# Patient Record
Sex: Female | Born: 1955
Health system: Southern US, Community
[De-identification: ages and names within clinical notes are randomized; demographics above are authoritative.]

## PROBLEM LIST (undated history)

## (undated) DIAGNOSIS — F411 Generalized anxiety disorder: Secondary | ICD-10-CM

## (undated) DIAGNOSIS — K219 Gastro-esophageal reflux disease without esophagitis: Secondary | ICD-10-CM

## (undated) DIAGNOSIS — I4949 Other premature depolarization: Secondary | ICD-10-CM

## (undated) DIAGNOSIS — R9439 Abnormal result of other cardiovascular function study: Secondary | ICD-10-CM

## (undated) DIAGNOSIS — Z86010 Personal history of colonic polyps: Secondary | ICD-10-CM

## (undated) DIAGNOSIS — E785 Hyperlipidemia, unspecified: Secondary | ICD-10-CM

## (undated) DIAGNOSIS — I10 Essential (primary) hypertension: Secondary | ICD-10-CM

## (undated) DIAGNOSIS — R7302 Impaired glucose tolerance (oral): Secondary | ICD-10-CM

## (undated) HISTORY — DX: Gastro-esophageal reflux disease without esophagitis: K21.9

## (undated) HISTORY — DX: Abnormal result of other cardiovascular function study: R94.39

## (undated) HISTORY — DX: Impaired glucose tolerance (oral): R73.02

## (undated) HISTORY — DX: Hyperlipidemia, unspecified: E78.5

## (undated) HISTORY — DX: Generalized anxiety disorder: F41.1

## (undated) HISTORY — DX: Personal history of colonic polyps: Z86.010

## (undated) HISTORY — DX: Other premature depolarization: I49.49

## (undated) HISTORY — DX: Essential (primary) hypertension: I10

---

## 1997-03-24 HISTORY — PX: ABDOMINAL HYSTERECTOMY: SHX81

## 2000-06-24 ENCOUNTER — Inpatient Hospital Stay (HOSPITAL_COMMUNITY): Admission: RE | Admit: 2000-06-24 | Discharge: 2000-06-26 | Payer: Self-pay | Admitting: Obstetrics and Gynecology

## 2000-06-24 ENCOUNTER — Encounter (INDEPENDENT_AMBULATORY_CARE_PROVIDER_SITE_OTHER): Payer: Self-pay | Admitting: Specialist

## 2003-01-24 ENCOUNTER — Encounter: Payer: Self-pay | Admitting: Internal Medicine

## 2004-04-24 ENCOUNTER — Ambulatory Visit: Payer: Self-pay | Admitting: Internal Medicine

## 2004-08-22 ENCOUNTER — Ambulatory Visit: Payer: Self-pay | Admitting: Internal Medicine

## 2005-01-14 ENCOUNTER — Ambulatory Visit: Payer: Self-pay | Admitting: Internal Medicine

## 2005-02-22 ENCOUNTER — Ambulatory Visit: Payer: Self-pay | Admitting: Internal Medicine

## 2005-02-28 ENCOUNTER — Ambulatory Visit: Payer: Self-pay | Admitting: Internal Medicine

## 2005-09-25 ENCOUNTER — Ambulatory Visit: Payer: Self-pay | Admitting: Internal Medicine

## 2006-03-03 ENCOUNTER — Ambulatory Visit: Payer: Self-pay | Admitting: Internal Medicine

## 2006-03-03 LAB — CONVERTED CEMR LAB
ALT: 21 units/L (ref 0–40)
AST: 19 units/L (ref 0–37)
Albumin: 4.2 g/dL (ref 3.5–5.2)
Alkaline Phosphatase: 93 units/L (ref 39–117)
BUN: 8 mg/dL (ref 6–23)
CO2: 27 meq/L (ref 19–32)
Calcium: 9.5 mg/dL (ref 8.4–10.5)
Chloride: 108 meq/L (ref 96–112)
Chol/HDL Ratio, serum: 4.4
Cholesterol: 181 mg/dL (ref 0–200)
Creatinine, Ser: 0.6 mg/dL (ref 0.4–1.2)
GFR calc non Af Amer: 112 mL/min
Glomerular Filtration Rate, Af Am: 135 mL/min/{1.73_m2}
Glucose, Bld: 108 mg/dL — ABNORMAL HIGH (ref 70–99)
HCT: 41.1 % (ref 36.0–46.0)
HDL: 40.7 mg/dL (ref >39.0)
Hemoglobin: 13.9 g/dL (ref 12.0–15.0)
LDL Cholesterol: 112 mg/dL — ABNORMAL HIGH (ref 0–99)
MCHC: 33.7 g/dL (ref 30.0–36.0)
MCV: 85.6 fL (ref 78.0–100.0)
Platelets: 254 10*3/uL (ref 150–400)
Potassium: 3.7 meq/L (ref 3.5–5.1)
RBC: 4.8 M/uL (ref 3.87–5.11)
RDW: 13.7 % (ref 11.5–14.6)
Sodium: 142 meq/L (ref 135–145)
TSH: 1.75 microintl units/mL (ref 0.35–5.50)
Total Bilirubin: 0.6 mg/dL (ref 0.3–1.2)
Total Protein: 7.4 g/dL (ref 6.0–8.3)
Triglyceride fasting, serum: 142 mg/dL (ref 0–149)
VLDL: 28 mg/dL (ref 0–40)
WBC: 5 10*3/uL (ref 4.5–10.5)

## 2006-04-21 ENCOUNTER — Ambulatory Visit: Payer: Self-pay | Admitting: Internal Medicine

## 2006-09-14 ENCOUNTER — Encounter: Payer: Self-pay | Admitting: Internal Medicine

## 2006-09-14 ENCOUNTER — Ambulatory Visit: Payer: Self-pay | Admitting: Internal Medicine

## 2006-09-14 DIAGNOSIS — E785 Hyperlipidemia, unspecified: Secondary | ICD-10-CM

## 2006-09-14 DIAGNOSIS — I10 Essential (primary) hypertension: Secondary | ICD-10-CM | POA: Insufficient documentation

## 2006-09-14 HISTORY — DX: Essential (primary) hypertension: I10

## 2006-09-14 HISTORY — DX: Hyperlipidemia, unspecified: E78.5

## 2006-10-13 ENCOUNTER — Telehealth: Payer: Self-pay | Admitting: Internal Medicine

## 2006-10-27 ENCOUNTER — Telehealth: Payer: Self-pay | Admitting: Internal Medicine

## 2007-11-18 ENCOUNTER — Telehealth: Payer: Self-pay | Admitting: Internal Medicine

## 2008-01-04 ENCOUNTER — Ambulatory Visit: Payer: Self-pay | Admitting: Internal Medicine

## 2008-01-04 DIAGNOSIS — E049 Nontoxic goiter, unspecified: Secondary | ICD-10-CM | POA: Insufficient documentation

## 2008-01-10 ENCOUNTER — Telehealth: Payer: Self-pay | Admitting: Internal Medicine

## 2008-02-02 ENCOUNTER — Telehealth: Payer: Self-pay | Admitting: Internal Medicine

## 2008-02-08 ENCOUNTER — Telehealth: Payer: Self-pay | Admitting: Internal Medicine

## 2008-02-09 ENCOUNTER — Ambulatory Visit: Payer: Self-pay | Admitting: Internal Medicine

## 2008-02-23 ENCOUNTER — Ambulatory Visit: Payer: Self-pay | Admitting: Internal Medicine

## 2008-02-23 ENCOUNTER — Encounter: Payer: Self-pay | Admitting: Internal Medicine

## 2008-02-23 LAB — HM COLONOSCOPY: HM Colonoscopy: 2019

## 2008-03-02 ENCOUNTER — Encounter: Payer: Self-pay | Admitting: Internal Medicine

## 2008-07-31 ENCOUNTER — Encounter: Payer: Self-pay | Admitting: Internal Medicine

## 2008-08-14 ENCOUNTER — Ambulatory Visit: Payer: Self-pay | Admitting: Internal Medicine

## 2008-08-14 DIAGNOSIS — K219 Gastro-esophageal reflux disease without esophagitis: Secondary | ICD-10-CM

## 2008-08-14 DIAGNOSIS — I4949 Other premature depolarization: Secondary | ICD-10-CM | POA: Insufficient documentation

## 2008-08-14 HISTORY — DX: Other premature depolarization: I49.49

## 2008-08-14 HISTORY — DX: Gastro-esophageal reflux disease without esophagitis: K21.9

## 2008-08-24 ENCOUNTER — Telehealth (INDEPENDENT_AMBULATORY_CARE_PROVIDER_SITE_OTHER): Payer: Self-pay | Admitting: *Deleted

## 2008-08-29 ENCOUNTER — Encounter: Payer: Self-pay | Admitting: Internal Medicine

## 2008-08-29 ENCOUNTER — Ambulatory Visit: Payer: Self-pay

## 2008-08-31 ENCOUNTER — Telehealth: Payer: Self-pay | Admitting: Internal Medicine

## 2008-10-19 DIAGNOSIS — R9439 Abnormal result of other cardiovascular function study: Secondary | ICD-10-CM | POA: Insufficient documentation

## 2008-10-19 HISTORY — DX: Abnormal result of other cardiovascular function study: R94.39

## 2008-11-20 ENCOUNTER — Ambulatory Visit: Payer: Self-pay | Admitting: Cardiovascular Disease

## 2009-01-11 ENCOUNTER — Encounter (INDEPENDENT_AMBULATORY_CARE_PROVIDER_SITE_OTHER): Payer: Self-pay | Admitting: *Deleted

## 2009-03-05 ENCOUNTER — Encounter (INDEPENDENT_AMBULATORY_CARE_PROVIDER_SITE_OTHER): Payer: Self-pay | Admitting: *Deleted

## 2009-03-29 ENCOUNTER — Encounter: Payer: Self-pay | Admitting: Internal Medicine

## 2009-05-28 ENCOUNTER — Ambulatory Visit: Payer: Self-pay | Admitting: Internal Medicine

## 2009-05-28 LAB — CONVERTED CEMR LAB
ALT: 20 units/L (ref 0–35)
AST: 21 units/L (ref 0–37)
Albumin: 4.1 g/dL (ref 3.5–5.2)
Alkaline Phosphatase: 71 units/L (ref 39–117)
BUN: 10 mg/dL (ref 6–23)
Basophils Absolute: 0 10*3/uL (ref 0.0–0.1)
Basophils Relative: 1 % (ref 0.0–3.0)
Bilirubin Urine: NEGATIVE
Bilirubin, Direct: 0.1 mg/dL (ref 0.0–0.3)
CO2: 30 meq/L (ref 19–32)
Calcium: 9.2 mg/dL (ref 8.4–10.5)
Chloride: 109 meq/L (ref 96–112)
Cholesterol: 197 mg/dL (ref 0–200)
Creatinine, Ser: 0.7 mg/dL (ref 0.4–1.2)
Eosinophils Absolute: 0.1 10*3/uL (ref 0.0–0.7)
Eosinophils Relative: 1.8 % (ref 0.0–5.0)
GFR calc non Af Amer: 111.49 mL/min (ref >60)
Glucose, Bld: 95 mg/dL (ref 70–99)
Glucose, Urine, Semiquant: NEGATIVE
HCT: 37.5 % (ref 36.0–46.0)
HDL: 46.7 mg/dL (ref >39.00)
Hemoglobin: 12.3 g/dL (ref 12.0–15.0)
Ketones, urine, test strip: NEGATIVE
LDL Cholesterol: 111 mg/dL — ABNORMAL HIGH (ref 0–99)
Lymphocytes Relative: 42.7 % (ref 12.0–46.0)
Lymphs Abs: 1.7 10*3/uL (ref 0.7–4.0)
MCHC: 32.8 g/dL (ref 30.0–36.0)
MCV: 87.4 fL (ref 78.0–100.0)
Monocytes Absolute: 0.2 10*3/uL (ref 0.1–1.0)
Monocytes Relative: 4.9 % (ref 3.0–12.0)
Neutro Abs: 2 10*3/uL (ref 1.4–7.7)
Neutrophils Relative %: 49.6 % (ref 43.0–77.0)
Nitrite: NEGATIVE
Platelets: 180 10*3/uL (ref 150.0–400.0)
Potassium: 3.7 meq/L (ref 3.5–5.1)
RBC: 4.29 M/uL (ref 3.87–5.11)
RDW: 13.5 % (ref 11.5–14.6)
Sodium: 144 meq/L (ref 135–145)
Specific Gravity, Urine: 1.02
TSH: 1.2 microintl units/mL (ref 0.35–5.50)
Total Bilirubin: 0.3 mg/dL (ref 0.3–1.2)
Total CHOL/HDL Ratio: 4
Total Protein: 7 g/dL (ref 6.0–8.3)
Triglycerides: 195 mg/dL — ABNORMAL HIGH (ref 0.0–149.0)
Urobilinogen, UA: 0.2
VLDL: 39 mg/dL (ref 0.0–40.0)
WBC: 4 10*3/uL — ABNORMAL LOW (ref 4.5–10.5)
pH: 6

## 2009-06-04 ENCOUNTER — Ambulatory Visit: Payer: Self-pay | Admitting: Internal Medicine

## 2009-06-04 DIAGNOSIS — F411 Generalized anxiety disorder: Secondary | ICD-10-CM | POA: Insufficient documentation

## 2009-06-04 HISTORY — DX: Generalized anxiety disorder: F41.1

## 2010-04-21 LAB — CONVERTED CEMR LAB
ALT: 23 units/L (ref 0–35)
AST: 22 units/L (ref 0–37)
Albumin: 4.4 g/dL (ref 3.5–5.2)
Alkaline Phosphatase: 75 units/L (ref 39–117)
BUN: 14 mg/dL (ref 6–23)
Basophils Absolute: 0.1 10*3/uL (ref 0.0–0.1)
Basophils Relative: 1.8 % (ref 0.0–3.0)
Bilirubin, Direct: 0.1 mg/dL (ref 0.0–0.3)
CO2: 28 meq/L (ref 19–32)
Calcium: 9.6 mg/dL (ref 8.4–10.5)
Chloride: 107 meq/L (ref 96–112)
Cholesterol: 214 mg/dL (ref 0–200)
Creatinine, Ser: 0.8 mg/dL (ref 0.4–1.2)
Direct LDL: 122 mg/dL
Eosinophils Absolute: 0.1 10*3/uL (ref 0.0–0.7)
Eosinophils Relative: 2.1 % (ref 0.0–5.0)
GFR calc Af Amer: 96 mL/min
GFR calc non Af Amer: 79 mL/min
Glucose, Bld: 79 mg/dL (ref 70–99)
HCT: 40.4 % (ref 36.0–46.0)
HDL: 38.1 mg/dL — ABNORMAL LOW (ref >39.0)
Hemoglobin: 13.2 g/dL (ref 12.0–15.0)
Lymphocytes Relative: 39.8 % (ref 12.0–46.0)
MCHC: 32.8 g/dL (ref 30.0–36.0)
MCV: 85.7 fL (ref 78.0–100.0)
Monocytes Absolute: 0.1 10*3/uL (ref 0.1–1.0)
Monocytes Relative: 2.7 % — ABNORMAL LOW (ref 3.0–12.0)
Neutro Abs: 2.9 10*3/uL (ref 1.4–7.7)
Neutrophils Relative %: 53.6 % (ref 43.0–77.0)
Platelets: 196 10*3/uL (ref 150–400)
Potassium: 3.7 meq/L (ref 3.5–5.1)
RBC: 4.71 M/uL (ref 3.87–5.11)
RDW: 14.1 % (ref 11.5–14.6)
Sodium: 143 meq/L (ref 135–145)
TSH: 1.27 microintl units/mL (ref 0.35–5.50)
Total Bilirubin: 0.7 mg/dL (ref 0.3–1.2)
Total CHOL/HDL Ratio: 5.6
Total Protein: 7.6 g/dL (ref 6.0–8.3)
Triglycerides: 260 mg/dL (ref 0–149)
VLDL: 52 mg/dL — ABNORMAL HIGH (ref 0–40)
WBC: 5.3 10*3/uL (ref 4.5–10.5)

## 2010-04-23 NOTE — Progress Notes (Signed)
Summary: 90 day Rx for [facility]  Phone Note Call from Patient Call back at Work Phone [phone]   Caller: Patient Summary of Call: has Rx request- needs to give details. Pls call.  Pt called back, requesting 90 day Rx for [facility] for Pravastatin Sodium 40mg , one daily.  Pt requested the Rx be mailed to her home. Sid Falcon LPN  February 02, 2008 12:55 PM  Initial call taken by: Raechel Ache, RN,  February 02, 2008 11:57 AM      Prescriptions: PRAVASTATIN SODIUM 40 MG TABS (PRAVASTATIN SODIUM) one tab daily  #90 x 4   Entered by:   Raechel Ache, RN   Authorized by:   Gordy Savers  MD   Signed by:   Raechel Ache, RN on 02/07/2008   Method used:   Print then Give to Patient   RxID:   [id number]

## 2010-04-23 NOTE — Progress Notes (Signed)
Summary: 90 day prescription  Phone Note Call from Patient   Caller: Patient Call For: Darel Hong Summary of Call: Pt is calling asking about her 90 day prescription and if it has been done. Initial call taken by: Lynann Beaver CMA,  February 08, 2008 10:31 AM  Follow-up for Phone Call        Phone Call Completed, Rx was mailed. Follow-up by: Raechel Ache, RN,  February 08, 2008 10:55 AM

## 2010-04-23 NOTE — Letter (Signed)
Summary: Appointment - Missed  [facility], Main Office  [address]   [place], [place] [address]   Phone: [phone]  Fax: [phone]     January 11, 2009 MRN: [id number]   Vanessa Price [address] [place], [place]  [address]   Dear Ms. Derek Jack,  Our records indicate you missed your appointment on 01/08/2009 with Dr. Eden Emms.                                    It is very important that we reach you to reschedule this appointment. We look forward to participating in your health care needs. Please contact us at the number listed above at your earliest convenience to reschedule this appointment.  Sincerely,    Migdalia Dk [facility] Scheduling Team

## 2010-04-23 NOTE — Progress Notes (Signed)
Summary: lab results  Phone Note Call from Patient   Caller: Patient Call For: Dr Kirtland Bouchard Summary of Call: Pt would like lab results. [phone] Please leave a detailed message on her phone. Initial call taken by: Lynann Beaver CMA,  January 10, 2008 1:30 PM    all OK;  cholesterol 214  Appended Document: lab results LM on pt's cell phone with results.

## 2010-04-23 NOTE — Progress Notes (Signed)
Summary: statin  Call back at [phone]   Caller: vm Call For: kwia Summary of Call: Requests cheapest statin for high cholesterol be called into [facility]. Initial call taken by: Rudy Jew, RN,  November 18, 2007 3:53 PM     pravastatin 40  #90 one daily  Appended Document: statin Medications Added PRAVASTATIN SODIUM 40 MG TABS (PRAVASTATIN SODIUM) one tab daily          Clinical Lists Changes  Medications: Added new medication of PRAVASTATIN SODIUM 40 MG TABS (PRAVASTATIN SODIUM) one tab daily - Signed Rx of PRAVASTATIN SODIUM 40 MG TABS (PRAVASTATIN SODIUM) one tab daily;  #90 x 1;  Signed;  Entered by: Sid Falcon LPN;  Authorized by: Gordy Savers  MD;  Method used: Electronically to [facility] [phone] [place], [place], [place]  [address], Ph: [phone], Fax: [phone]    Prescriptions: PRAVASTATIN SODIUM 40 MG TABS (PRAVASTATIN SODIUM) one tab daily  #90 x 1   Entered by:   Sid Falcon LPN   Authorized by:   Gordy Savers  MD   Signed by:   Sid Falcon LPN on 51/88/4166   Method used:   Electronically to        [facility] [address] (retail)       [phone]       [place], [place]  [address]       Ph: [phone]       Fax: [phone]   RxID:   [id number]  Rx electronically sent, pt informed Sid Falcon LPN  November 19, 2007 8:45 AM

## 2010-04-23 NOTE — Assessment & Plan Note (Signed)
Summary: cpx/cjr   Vital Signs:  Patient profile:   55 year old female Height:      64 inches Weight:      199 pounds BMI:     34.28 Temp:     98.3 degrees F oral BP sitting:   110 / 76  (left arm) Cuff size:   regular  Vitals Entered By: Duard Brady LPN (June 04, 2009 9:19 AM) CC: cpx - Dr. Vashti Hey gyn , last pap 07/2008 WNL , mammo 07/2008 WNL  , needs refill Is Patient Diabetic? No   CC:  cpx - Dr. Vashti Hey gyn , last pap 07/2008 WNL , mammo 07/2008 WNL  , and needs refill.  History of Present Illness: 55 year old patient who is seen today for a preventive examination.  she has a history of treated hypertension, PVCs, and a goiter.  She is treated dyslipidemia.  Last year.  She had a medical medicine stress test that revealed some exercise induced PVCs, but otherwise a normal perfusion.  Study.  She declined a heart catheterization at that time to completely evaluate.  She has been stable,  exercising  more regularly and denies any exertional symptoms.  She continues to have mild reflux that has been improved.  Preventive Screening-Counseling & Management  Alcohol-Tobacco     Smoking Status: quit  Allergies: 1)  ! Risperdal (Risperidone) 2)  ! * Lisinopril  Past History:  Past Medical History: Current Problems:  HYPERLIPIDEMIA (ICD-272.4) HYPERTENSION (ICD-401.9) PREMATURE VENTRICULAR CONTRACTIONS (ICD-427.69) GERD (ICD-530.81) GOITER, UNSPECIFIED (ICD-240.9) PREVENTIVE HEALTH CARE (ICD-V70.0) impaired glucose tolerance Anxiety  Past Surgical History: Caesarean section Hysterectomy  2002 gravida two, para two, abortus zero Stress Test  08-2008 Colonoscopy 12-09  Family History: Reviewed history from 08/14/2008 and no changes required. father died age 71, history of esophageal rupture, mental illness mother died  of complications of heart failure, history of diabetes, osteoarthritis  One brother with organic  brain syndrome  Social History: Reviewed  history from 01/04/2008 and no changes required. Widow/Widower husband died approximately 3 years ago, following cardiac transplant surgery  Review of Systems  The patient denies anorexia, fever, weight loss, weight gain, vision loss, decreased hearing, hoarseness, chest pain, syncope, dyspnea on exertion, peripheral edema, prolonged cough, headaches, hemoptysis, abdominal pain, melena, hematochezia, severe indigestion/heartburn, hematuria, incontinence, genital sores, muscle weakness, suspicious skin lesions, transient blindness, difficulty walking, depression, unusual weight change, abnormal bleeding, enlarged lymph nodes, angioedema, and breast masses.    Physical Exam  General:  Well-developed,well-nourished,in no acute distress; alert,appropriate and cooperative throughout examination Head:  Normocephalic and atraumatic without obvious abnormalities. No apparent alopecia or balding. Eyes:  No corneal or conjunctival inflammation noted. EOMI. Perrla. Funduscopic exam benign, without hemorrhages, exudates or papilledema. Vision grossly normal. Ears:  External ear exam shows no significant lesions or deformities.  Otoscopic examination reveals clear canals, tympanic membranes are intact bilaterally without bulging, retraction, inflammation or discharge. Hearing is grossly normal bilaterally. Nose:  External nasal examination shows no deformity or inflammation. Nasal mucosa are pink and moist without lesions or exudates. Mouth:  Oral mucosa and oropharynx without lesions or exudates.  Teeth in good repair. Neck:  No deformities, masses, or tenderness noted. Chest Wall:  No deformities, masses, or tenderness noted. Lungs:  Normal respiratory effort, chest expands symmetrically. Lungs are clear to auscultation, no crackles or wheezes. Heart:  Normal rate and regular rhythm. S1 and S2 normal without gallop, murmur, click, rub or other extra sounds. Abdomen:  Bowel sounds positive,abdomen soft and  non-tender  without masses, organomegaly or hernias noted. Msk:  No deformity or scoliosis noted of thoracic or lumbar spine.   Pulses:  R and L carotid,radial,femoral,dorsalis pedis and posterior tibial pulses are full and equal bilaterally Extremities:  No clubbing, cyanosis, edema, or deformity noted with normal full range of motion of all joints.   Neurologic:  No cranial nerve deficits noted. Station and gait are normal. Plantar reflexes are down-going bilaterally. DTRs are symmetrical throughout. Sensory, motor and coordinative functions appear intact. Skin:  Intact without suspicious lesions or rashes Cervical Nodes:  No lymphadenopathy noted Axillary Nodes:  No palpable lymphadenopathy Inguinal Nodes:  No significant adenopathy Psych:  Cognition and judgment appear intact. Alert and cooperative with normal attention span and concentration. No apparent delusions, illusions, hallucinations   Impression & Recommendations:  Problem # 1:  HYPERTENSION (ICD-401.9)  Her updated medication list for this problem includes:    Benicar Hct 40-25 Mg Tabs (Olmesartan medoxomil-hctz) ..... One daily  Her updated medication list for this problem includes:    Benicar Hct 40-25 Mg Tabs (Olmesartan medoxomil-hctz) ..... One daily  Orders: Prescription Created Electronically [phone])  Problem # 2:  PREMATURE VENTRICULAR CONTRACTIONS (ICD-427.69)  Problem # 3:  GERD (ICD-530.81)  Her updated medication list for this problem includes:    Dexilant 60 Mg Cpdr (Dexlansoprazole) .Marland Kitchen... 1 tab by mouth once daily  Her updated medication list for this problem includes:    Dexilant 60 Mg Cpdr (Dexlansoprazole) .Marland Kitchen... 1 tab by mouth once daily  Problem # 4:  PREVENTIVE HEALTH CARE (ICD-V70.0)  Complete Medication List: 1)  Pravachol 40 Mg Tabs (Pravastatin sodium) .Marland Kitchen.. 1 tab by mouth once daily 2)  Benicar Hct 40-25 Mg Tabs (Olmesartan medoxomil-hctz) .... One daily 3)  Honey Bee Venom 1000 Mcg Solr  (Honey bee venom) .Marland Kitchen.. 1 tab by mouth once daily 4)  Dexilant 60 Mg Cpdr (Dexlansoprazole) .Marland Kitchen.. 1 tab by mouth once daily 5)  Alprazolam 0.5 Mg Tabs (Alprazolam) .... One twice daily as needed  Patient Instructions: 1)  Limit your Sodium (Salt). 2)  It is important that you exercise regularly at least 20 minutes 5 times a week. If you develop chest pain, have severe difficulty breathing, or feel very tired , stop exercising immediately and seek medical attention. 3)  Check your Blood Pressure regularly. If it is above: you should make an appointment. Prescriptions: ALPRAZOLAM 0.5 MG TABS (ALPRAZOLAM) one twice daily as needed  #50 x 3   Entered and Authorized by:   Gordy Savers  MD   Signed by:   Gordy Savers  MD on 06/04/2009   Method used:   Print then Give to Patient   RxID:   [id number] DEXILANT 60 MG CPDR (DEXLANSOPRAZOLE) 1 tab by mouth once daily  #90 x 6   Entered and Authorized by:   Gordy Savers  MD   Signed by:   Gordy Savers  MD on 06/04/2009   Method used:   Print then Give to Patient   RxID:   [id number] BENICAR HCT 40-25 MG TABS (OLMESARTAN MEDOXOMIL-HCTZ) one daily  #90 x 6   Entered and Authorized by:   Gordy Savers  MD   Signed by:   Gordy Savers  MD on 06/04/2009   Method used:   Print then Give to Patient   RxID:   [id number] PRAVACHOL 40 MG TABS (PRAVASTATIN SODIUM) 1 tab by mouth once daily  #90 x 6   Entered and Authorized by:  Gordy Savers  MD   Signed by:   Gordy Savers  MD on 06/04/2009   Method used:   Print then Give to Patient   RxID:   [id number] DEXILANT 60 MG CPDR (DEXLANSOPRAZOLE) 1 tab by mouth once daily  #90 x 6   Entered and Authorized by:   Gordy Savers  MD   Signed by:   Gordy Savers  MD on 06/04/2009   Method used:   Electronically to        [facility] [address] (retail)       [phone]       [place], [place]  [address]       Ph: [phone]       Fax: [phone]   RxID:   [id number] BENICAR HCT 40-25 MG TABS (OLMESARTAN MEDOXOMIL-HCTZ) one daily  #90 x 6   Entered and Authorized by:   Gordy Savers  MD   Signed by:   Gordy Savers  MD on 06/04/2009   Method used:   Electronically to        [facility] [address] (retail)       [phone]       [place], [place]  [address]       Ph: [phone]       Fax: [phone]   RxID:   [id number] PRAVACHOL 40 MG TABS (PRAVASTATIN SODIUM) 1 tab by mouth once daily  #90 x 6   Entered and Authorized by:   Gordy Savers  MD   Signed by:   Gordy Savers  MD on 06/04/2009   Method used:   Electronically to        [facility] [address] (retail)       [phone]       [place], [place]  [address]       Ph: [phone]       Fax: [phone]   RxID:   [id number]

## 2010-04-23 NOTE — Progress Notes (Signed)
Summary: stress test results  Phone Note Call from Patient Call back at Work Phone [phone]   Caller: Patient Call For: dr Kirtland Bouchard Summary of Call: pt is req stress test results Initial call taken by: Heron Sabins,  August 31, 2008 2:08 PM    discussed with patient-  Will ask cardiology to evaluate

## 2010-04-23 NOTE — Progress Notes (Signed)
Summary: NEED MED CHANGE  Medications Added HYDROCHLOROTHIAZIDE 25 MG TABS (HYDROCHLOROTHIAZIDE)  LIPITOR 40 MG TABS (ATORVASTATIN CALCIUM) Take 1 tablet by mouth once a day LORAZEPAM 0.5 MG TABS (LORAZEPAM)  SIMVASTATIN 40 MG  TABS (SIMVASTATIN)  HYDROCHLOROTHIAZIDE 25 MG TABS (HYDROCHLOROTHIAZIDE)  LORAZEPAM 0.5 MG TABS (LORAZEPAM)  LORAZEPAM 0.5 MG TABS (LORAZEPAM)       Allergies Added: ! RISPERDAL (RISPERIDONE) Phone Note Call from Patient   Caller: Patient Call For: KWIA Reason for Call: Talk to Nurse Summary of Call: RX RISPEROL IS MAKING HER COUGH SO BADLY. PT D/C'D RX. WANTS ALTERNATIVE CALLED TO [phone] Initial call taken by: Warnell Forester,  October 13, 2006 10:16 AM  Follow-up for Phone Call        allergy risperdal added to allergy list.  pt requests sub med Follow-up by: Rudy Jew, RN,  October 13, 2006 11:53 AM  Additional Follow-up for Phone Call Additional follow up Details #1::         I need chart to review     New Allergies: ! RISPERDAL (RISPERIDONE) Additional Follow-up for Phone Call Additional follow up Details #2::    I need chart to review  Additional Follow-up for Phone Call Additional follow up Details #3:: Details for Additional Follow-up Action Taken: Patient was placed on lisinopril/hctz lasst visit.  Cough is a common side affect.  Swith to Benicar/hctz 20/12.5 ROV 6 wks. Additional Follow-up by: Raechel Ache, RN,  October 15, 2006 9:10 AM  New/Updated Medications: HYDROCHLOROTHIAZIDE 25 MG TABS (HYDROCHLOROTHIAZIDE)  LIPITOR 40 MG TABS (ATORVASTATIN CALCIUM) Take 1 tablet by mouth once a day LORAZEPAM 0.5 MG TABS (LORAZEPAM)  SIMVASTATIN 40 MG  TABS (SIMVASTATIN)  HYDROCHLOROTHIAZIDE 25 MG TABS (HYDROCHLOROTHIAZIDE)  LORAZEPAM 0.5 MG TABS (LORAZEPAM)  LORAZEPAM 0.5 MG TABS (LORAZEPAM)  New Allergies: ! RISPERDAL (RISPERIDONE)  Appended Document: NEED MED CHANGE    Phone Note Call from Patient   Caller:  Patient Action Taken: Phone Call Completed Summary of Call: PT HAS CALLED BACK A COUPLE OF TIMES. HAVE YOU CALLED HER? Initial call taken by: Warnell Forester,  October 15, 2006 1:52 PM

## 2010-04-23 NOTE — Assessment & Plan Note (Signed)
Summary: Cardiology Nuclear Study  Nuclear Med Background Indications for Stress Test: Evaluation for Ischemia  Indications Comments: Frequent PVC's    Symptoms: Chest Pain, Chest Pain with Exertion  Symptoms Comments: Indigestion with increase belching   Nuclear Pre-Procedure Cardiac Risk Factors: History of Smoking, Hypertension, Lipids, Obesity Caffeine/Decaff Intake: none NPO After: 10:30 PM Lungs: clear IV 0.9% NS with Angio Cath: 22g     IV Site: (R) AC IV Started by: Irean Hong RN Chest Size (in) 40     Cup Size DD     Height (in): 64 Weight (lb): 197 BMI: 33.94  Nuclear Med Study 1 or 2 day study:  1 day     Stress Test Type:  Stress Reading MD:  Nona Dell, MD     Referring MD:  Lona Kettle Resting Radionuclide:  Technetium 51m Tetrofosmin     Resting Radionuclide Dose:  10.1 mCi  Stress Radionuclide:  Technetium 9m Tetrofosmin     Stress Radionuclide Dose:  33 mCi   Stress Protocol Exercise Time (min):  4:00 min     Max HR:  150 bpm     Predicted Max HR: 166 bpm  Max Systolic BP: 157 mm Hg     % Max HR:  90% %     METS: 5.8 Rate Pressure Product:  13244    Stress Test Technologist:  Irean Hong RN     Nuclear Technologist:  Domenic Polite CNMT  Rest Procedure  Myocardial perfusion imaging was performed at rest 45 minutes following the intraveneous administration of Myoview Technetium 36m Tetrofosmin.  Stress Procedure  The patient exercised for four minutes.   The patient stopped due to DOE  and denied any chest pain.  There were abnormal ST changes, frequent Pvc's, bigeminy and trigeminy Pvc's.  Myoview was injected at peak exercise and myocardial perfusion imaging was performed after a brief delay.  QPS Raw Data Images:  Mild breast attenuation.  Normal right ventricular size. Stress Images:  There is otherwise normal uptake in all areas. Rest Images:  Comparison with the stress images reveals no significant change. Subtraction (SDS):  There  is no clear evidence of scar or ischemia. Transient Ischemic Dilatation:  1.06  (Normal <1.22)  Lung/Heart Ratio:  .39  (Normal <0.45)  Quantitative Gated Spect Images QGS EDV:  84 ml QGS ESV:  34 ml QGS EF:  60 % QGS cine images:  No focal wall motion abnormalities.   Overall Impression  Exercise Capacity: Fair exercise capacity. BP Response: Normal blood pressure response. Clinical Symptoms: Dyspnea, no chest pain.  Belching in recovery. ECG Impression: Abnormal ST changes noted.  Frequent PVC's. Overall Impression: Equivocal stress Myoview. Overall Impression Comments: Abnormal ST changes noted with frequent PVC's although no clear perfusion evidence of focal ischemia.  LVEF 60%.  T.I.D. ratio normal.

## 2010-04-23 NOTE — Progress Notes (Signed)
Summary: Nuc pre procedure   Phone Note Outgoing Call Call back at [facility] Phone [phone]   Call placed by: Rea College, CMA,  August 24, 2008 4:48 PM Call placed to: Patient Summary of Call: Left message on work phone with information on Myoview Information Sheet (see scanned document for details).        Nuclear Med Background Indications for Stress Test: Evaluation for Ischemia     Symptoms: Chest Pain  Symptoms Comments: frequent PVC's   Nuclear Pre-Procedure Cardiac Risk Factors: Hypertension, Lipids Height (in): 65 Tech Comments: Impaired Glucose Tolerance

## 2010-04-23 NOTE — Miscellaneous (Signed)
Summary: [facility] Previsit/prep  Clinical Lists Changes  Medications: Added new medication of DULCOLAX 5 MG  TBEC (BISACODYL) Day before procedure take 2 at 3pm and 2 at 8pm. - Signed Added new medication of METOCLOPRAMIDE HCL 10 MG  TABS (METOCLOPRAMIDE HCL) As per prep instructions. - Signed Added new medication of MIRALAX   POWD (POLYETHYLENE GLYCOL 3350) As per prep  instructions. - Signed Rx of DULCOLAX 5 MG  TBEC (BISACODYL) Day before procedure take 2 at 3pm and 2 at 8pm.;  #4 x 0;  Signed;  Entered by: Wyona Almas RN;  Authorized by: Iva Boop MD;  Method used: Electronically to [facility] [phone] [place], [place], [place]  [address], Ph: [phone], Fax: [phone] Rx of METOCLOPRAMIDE HCL 10 MG  TABS (METOCLOPRAMIDE HCL) As per prep instructions.;  #2 x 0;  Signed;  Entered by: Wyona Almas RN;  Authorized by: Iva Boop MD;  Method used: Electronically to [facility] [phone] [place], [place], [place]  [address], Ph: [phone], Fax: [phone] Rx of MIRALAX   POWD (POLYETHYLENE GLYCOL 3350) As per prep  instructions.;  #255gm x 0;  Signed;  Entered by: Wyona Almas RN;  Authorized by: Iva Boop MD;  Method used: Electronically to [facility] [address] [phone] [place], [place], [place]  [address], Ph: [phone], Fax: [phone] Observations: Added new observation of ALLERGY REV: Done (02/09/2008 8:17)    Prescriptions: MIRALAX   POWD (POLYETHYLENE GLYCOL 3350) As per prep  instructions.  #255gm x 0   Entered by:   Wyona Almas RN   Authorized by:   Iva Boop MD   Signed by:   Wyona Almas RN on 02/09/2008   Method used:   Electronically to        [facility] [address] (retail)       [phone]       [place], [place]  [address]       Ph: [phone]       Fax: [phone]   RxID:   [id number]  METOCLOPRAMIDE HCL 10 MG  TABS (METOCLOPRAMIDE HCL) As per prep instructions.  #2 x 0   Entered by:   Wyona Almas RN   Authorized by:   Iva Boop MD   Signed by:   Wyona Almas RN on 02/09/2008   Method used:   Electronically to        [facility] [address] (retail)       [phone]       [place], [place]  [address]       Ph: [phone]       Fax: [phone]   RxID:   [id number] DULCOLAX 5 MG  TBEC (BISACODYL) Day before procedure take 2 at 3pm and 2 at 8pm.  #4 x 0   Entered by:   Wyona Almas RN   Authorized by:   Iva Boop MD   Signed by:   Wyona Almas RN on 02/09/2008   Method used:   Electronically to        [facility] [address] (retail)       [phone]       [place], [place]  [address]       Ph: [phone]  Fax: [phone]   RxID:   [id number]

## 2010-04-23 NOTE — Assessment & Plan Note (Signed)
Summary: talk to doctor/mhf   Vital Signs:  Patient profile:   55 year old female Weight:      200 pounds BMI:     33.40 Temp:     98.2 degrees F oral Pulse rate:   54 / minute Pulse rhythm:   irregular BP sitting:   150 / 76  (left arm) Cuff size:   regular  Vitals Entered By: Raechel Ache, RN (Aug 14, 2008 11:50 AM)  CC:  Talk to doctor.Marland Kitchen  History of Present Illness: 55 year old  patient has a history of hypertension, and dyslipidemia, who has a number of concerns.  For the past month.  She has has fairly classic dyspepsia. She had a health-maintenance exam at work recently and was noted to have frequent PVCs.  Denies any exertional chest pain. Her blood pressure has been under excellent control on her present regimen.  She does have an intolerance to ACE inhibition. Lipid profile reviewed and numbers are not at goal on 40 mg of pravastatin.  Triglycerides were also elevated. Laboratory screen also revealed a slightly elevated.  Hemoglobin A1c of 6.2.  Fasting blood sugar was 81  Allergies: 1)  ! Risperdal (Risperidone)  Past History:  Past Medical History:    Hyperlipidemia    Hypertension    multi-nodular goiter    GERD    impaired glucose tolerance  Family History:    father died age 51, history of esophageal rupture, mental illness    mother died  of complications of heart failure, history of diabetes, osteoarthritis        One brother with organic  brain syndrome  Review of Systems       The patient complains of severe indigestion/heartburn.  The patient denies anorexia, fever, weight loss, weight gain, vision loss, decreased hearing, hoarseness, chest pain, syncope, dyspnea on exertion, peripheral edema, prolonged cough, headaches, hemoptysis, abdominal pain, melena, hematochezia, hematuria, incontinence, genital sores, muscle weakness, suspicious skin lesions, transient blindness, difficulty walking, depression, unusual weight change, abnormal bleeding,  enlarged lymph nodes, angioedema, and breast masses.    Physical Exam  General:  overweight-appearing. repeat blood pressure of 118/76overweight-appearing.   Head:  Normocephalic and atraumatic without obvious abnormalities. No apparent alopecia or balding. Mouth:  Oral mucosa and oropharynx without lesions or exudates.  Teeth in good repair. Neck:  No deformities, masses, or tenderness noted. Lungs:  Normal respiratory effort, chest expands symmetrically. Lungs are clear to auscultation, no crackles or wheezes. Heart:  Normal rate and regular rhythm. S1 and S2 normal without gallop, murmur, click, rub or other extra sounds.  occasional ectopics Abdomen:  Bowel sounds positive,abdomen soft and non-tender without masses, organomegaly or hernias noted. Msk:  No deformity or scoliosis noted of thoracic or lumbar spine.   Pulses:  R and L carotid,radial,femoral,dorsalis pedis and posterior tibial pulses are full and equal bilaterally Extremities:  No clubbing, cyanosis, edema, or deformity noted with normal full range of motion of all joints.     Impression & Recommendations:  Problem # 1:  GERD (ICD-530.81) will place on an anti-reflux regimen and short term PPI therapy  Problem # 2:  HYPERTENSION (ICD-401.9)  Her updated medication list for this problem includes:    Benicar Hct 40-25 Mg Tabs (Olmesartan medoxomil-hctz) ..... One daily    Her updated medication list for this problem includes:    Benicar Hct 40-25 Mg Tabs (Olmesartan medoxomil-hctz) ..... One daily  Orders: Cardiology Referral (Cardiology)  Problem # 3:  HYPERLIPIDEMIA (ICD-272.4)  will  increase her pravastatin to 80 mg daily and add niacin and omega-3 fatty acids; recheck fasting lipid profile in 3 months  Orders: Cardiology Referral (Cardiology)  Problem # 4:  PREMATURE VENTRICULAR CONTRACTIONS (ICD-427.69) due to her multiple risk factors, will set up for a Cardiolite stress test  Complete Medication List:  1)  Pravastatin Sodium 80 Mg Tabs (pravastatin Sodium)  .... One tab daily 2)  Benicar Hct 40-25 Mg Tabs (Olmesartan medoxomil-hctz) .... One daily  Patient Instructions: 1)  Please schedule a follow-up appointment in 3 months. 2)  Advised not to eat any food or drink any liquids after 10 PM the night before your procedure. 3)  It is important that you exercise regularly at least 20 minutes 5 times a week. If you develop chest pain, have severe difficulty breathing, or feel very tired , stop exercising immediately and seek medical attention. 4)  You need to lose weight. Consider a lower calorie diet and regular exercise.  Prescriptions: PRAVASTATIN SODIUM 80 MG TABS (PRAVASTATIN SODIUM) one tab daily  #90 x 6   Entered and Authorized by:   Gordy Savers  MD   Signed by:   Gordy Savers  MD on 08/14/2008   Method used:   Print then Give to Patient   RxID:   [id number]

## 2010-04-23 NOTE — Assessment & Plan Note (Signed)
Summary: cpx/jls   Vital Signs:  Patient Profile:   55 Years Old Female Height:     65 inches (165.1 cm) Weight:      198 pounds Temp:     98.3 degrees F oral BP sitting:   140 / 90  (left arm) Cuff size:   regular  Vitals Entered By: Raechel Ache, RN (January 04, 2008 3:00 PM)                 Chief Complaint:  CPX and fasting. Sees gyn.Marland Kitchen  History of Present Illness: 55 year old female seen today for a comprehensive annual evaluation.  Medical proms include hypertension and hypercholesterolemia.  She is doing well, although just into the death of her mother over the past 6 weeks.  She has a history of multi-nodular goiter    Current Allergies: ! RISPERDAL (RISPERIDONE)  Past Medical History:    Reviewed history from 09/14/2006 and no changes required:       Hyperlipidemia       Hypertension       multi-nodular goiter  Past Surgical History:    Reviewed history from 09/14/2006 and no changes required:       Caesarean section       Hysterectomy  2002       gravida two, para two, abortus zero   Family History:    father died age 38, history of esophageal rupture, mental illness    mother died  of complications of heart failure, history of diabetes, osteoarthritis        One brother with dramatic brain syndrome  Social History:    Widow/Widower    husband died approximately 3 years ago, following cardiac transplant surgery    Review of Systems  The patient denies anorexia, fever, weight loss, weight gain, vision loss, decreased hearing, hoarseness, chest pain, syncope, dyspnea on exertion, peripheral edema, prolonged cough, headaches, hemoptysis, abdominal pain, melena, hematochezia, severe indigestion/heartburn, hematuria, incontinence, genital sores, muscle weakness, suspicious skin lesions, transient blindness, difficulty walking, depression, unusual weight change, abnormal bleeding, enlarged lymph nodes, angioedema, and breast masses.         history of  cough on ACE inhibition   Physical Exam  General:     overweight-appearing.  140/90 Head:     Normocephalic and atraumatic without obvious abnormalities. No apparent alopecia or balding. Eyes:     No corneal or conjunctival inflammation noted. EOMI. Perrla. Funduscopic exam benign, without hemorrhages, exudates or papilledema. Vision grossly normal. Ears:     External ear exam shows no significant lesions or deformities.  Otoscopic examination reveals clear canals, tympanic membranes are intact bilaterally without bulging, retraction, inflammation or discharge. Hearing is grossly normal bilaterally. Mouth:     Oral mucosa and oropharynx without lesions or exudates.  Teeth in good repair. Neck:     marked nodular goiter Chest Wall:     No deformities, masses, or tenderness noted. Breasts:     No mass, nodules, thickening, tenderness, bulging, retraction, inflamation, nipple discharge or skin changes noted.   Lungs:     Normal respiratory effort, chest expands symmetrically. Lungs are clear to auscultation, no crackles or wheezes. Heart:     Normal rate and regular rhythm. S1 and S2 normal without gallop, murmur, click, rub or other extra sounds. Abdomen:     Bowel sounds positive,abdomen soft and non-tender without masses, organomegaly or hernias noted.  lower midline scar Msk:     No deformity or scoliosis noted of thoracic or lumbar  spine.   Pulses:     R and L carotid,radial,femoral,dorsalis pedis and posterior tibial pulses are full and equal bilaterally Extremities:     No clubbing, cyanosis, edema, or deformity noted with normal full range of motion of all joints.   Neurologic:     No cranial nerve deficits noted. Station and gait are normal. Plantar reflexes are down-going bilaterally. DTRs are symmetrical throughout. Sensory, motor and coordinative functions appear intact. Skin:     Intact without suspicious lesions or rashes Cervical Nodes:     No lymphadenopathy noted  Axillary Nodes:     No palpable lymphadenopathy Inguinal Nodes:     No significant adenopathy Psych:     Cognition and judgment appear intact. Alert and cooperative with normal attention span and concentration. No apparent delusions, illusions, hallucinations    Impression & Recommendations:  Problem # 1:  HYPERTENSION (ICD-401.9)  The following medications were removed from the medication list:    Benicar Hct 20-12.5 Mg Tabs (Olmesartan medoxomil-hctz)  Her updated medication list for this problem includes:    Benicar Hct 40-25 Mg Tabs (Olmesartan medoxomil-hctz) ..... One daily  Orders: TLB-BMP (Basic Metabolic Panel-BMET) (80048-METABOL) TLB-CBC Platelet - w/Differential (85025-CBCD)   Problem # 2:  HYPERLIPIDEMIA (ICD-272.4)  The following medications were removed from the medication list:    Simvastatin 40 Mg Tabs (Simvastatin)  Her updated medication list for this problem includes:    Pravastatin Sodium 40 Mg Tabs (Pravastatin sodium) ..... One tab daily  Orders: Venipuncture (21308) TLB-Lipid Panel (80061-LIPID) TLB-BMP (Basic Metabolic Panel-BMET) (80048-METABOL) TLB-CBC Platelet - w/Differential (85025-CBCD)   Complete Medication List: 1)  Pravastatin Sodium 40 Mg Tabs (Pravastatin sodium) .... One tab daily 2)  Benicar Hct 40-25 Mg Tabs (Olmesartan medoxomil-hctz) .... One daily  Other Orders: TLB-Hepatic/Liver Function Pnl (80076-HEPATIC) TLB-TSH (Thyroid Stimulating Hormone) [phone]) Gastroenterology Referral (GI)   Patient Instructions: 1)  Limit your Sodium (Salt) to less than 2 grams a day(slightly less than 1/2 a teaspoon) to prevent fluid retention, swelling, or worsening of symptoms. 2)  It is important that you exercise regularly at least 20 minutes 5 times a week. If you develop chest pain, have severe difficulty breathing, or feel very tired , stop exercising immediately and seek medical attention. 3)  You need to lose weight. Consider a  lower calorie diet and regular exercise.  4)  Schedule a colonoscopy/sigmoidoscopy to help detect colon cancer. 5)  Take calcium +Vitamin D daily. 6)  Check your Blood Pressure regularly. If it is above: you should make an appointment.   Prescriptions: BENICAR HCT 40-25 MG TABS (OLMESARTAN MEDOXOMIL-HCTZ) one daily  #90 x 6   Entered and Authorized by:   Gordy Savers  MD   Signed by:   Gordy Savers  MD on 01/04/2008   Method used:   Print then Give to Patient   RxID:   [id number] PRAVASTATIN SODIUM 40 MG TABS (PRAVASTATIN SODIUM) one tab daily  #90 x 6   Entered and Authorized by:   Gordy Savers  MD   Signed by:   Gordy Savers  MD on 01/04/2008   Method used:   Print then Give to Patient   RxID:   [id number]  ]

## 2010-04-23 NOTE — Progress Notes (Signed)
  Phone Note Call from Patient Call back at Home Phone [phone] Call back at Work Phone [phone]   Call For: kwai Summary of Call: request 90 day rx of benicar-hydrochlorozide, to be picked up so i can mail this. please call back Initial call taken by: Calvert Cantor,  October 27, 2006 12:27 PM  Follow-up for Phone Call        Phone Call Completed rx written to pick up Follow-up by: Raechel Ache, RN,  October 27, 2006 3:01 PM

## 2010-04-23 NOTE — Letter (Signed)
Summary: Medical Clearance for Exercise Program  Medical Clearance for Exercise Program   Imported By: Maryln Gottron 04/02/2009 15:29:25  _____________________________________________________________________  External Attachment:    Type:   Image     Comment:   External Document

## 2010-05-15 ENCOUNTER — Other Ambulatory Visit: Payer: Self-pay | Admitting: Internal Medicine

## 2010-05-17 ENCOUNTER — Telehealth: Payer: Self-pay | Admitting: Internal Medicine

## 2010-05-17 NOTE — Telephone Encounter (Signed)
Pt needs a refill on meds: Benicar HCT..... [facility]...Marland KitchenMarland Kitchen90-day supply.

## 2010-05-17 NOTE — Telephone Encounter (Signed)
Attempt to call - was filled to [facility] on 2/22 or 2/23 - KIK

## 2010-07-24 ENCOUNTER — Other Ambulatory Visit (INDEPENDENT_AMBULATORY_CARE_PROVIDER_SITE_OTHER): Payer: BC Managed Care – PPO | Admitting: Internal Medicine

## 2010-07-24 DIAGNOSIS — Z13220 Encounter for screening for lipoid disorders: Secondary | ICD-10-CM

## 2010-07-24 DIAGNOSIS — Z0000 Encounter for general adult medical examination without abnormal findings: Secondary | ICD-10-CM

## 2010-07-24 LAB — POCT URINALYSIS DIPSTICK
Bilirubin, UA: NEGATIVE
Glucose, UA: NEGATIVE
Ketones, UA: NEGATIVE
Nitrite, UA: NEGATIVE
Protein, UA: NEGATIVE
Spec Grav, UA: 1.01
Urobilinogen, UA: 0.2
pH, UA: 5

## 2010-07-24 LAB — CBC WITH DIFFERENTIAL/PLATELET
Basophils Absolute: 0 10*3/uL (ref 0.0–0.1)
Basophils Relative: 0.8 % (ref 0.0–3.0)
Eosinophils Absolute: 0.1 10*3/uL (ref 0.0–0.7)
Eosinophils Relative: 2.9 % (ref 0.0–5.0)
HCT: 38.9 % (ref 36.0–46.0)
Hemoglobin: 13.3 g/dL (ref 12.0–15.0)
Lymphocytes Relative: 41 % (ref 12.0–46.0)
Lymphs Abs: 2.2 10*3/uL (ref 0.7–4.0)
MCHC: 34.1 g/dL (ref 30.0–36.0)
MCV: 85.7 fl (ref 78.0–100.0)
Monocytes Absolute: 0.3 10*3/uL (ref 0.1–1.0)
Monocytes Relative: 5.4 % (ref 3.0–12.0)
Neutro Abs: 2.6 10*3/uL (ref 1.4–7.7)
Neutrophils Relative %: 49.9 % (ref 43.0–77.0)
Platelets: 209 10*3/uL (ref 150.0–400.0)
RBC: 4.54 Mil/uL (ref 3.87–5.11)
RDW: 15.1 % — ABNORMAL HIGH (ref 11.5–14.6)
WBC: 5.3 10*3/uL (ref 4.5–10.5)

## 2010-07-24 LAB — BASIC METABOLIC PANEL
BUN: 16 mg/dL (ref 6–23)
CO2: 27 mEq/L (ref 19–32)
Calcium: 9.5 mg/dL (ref 8.4–10.5)
Chloride: 102 mEq/L (ref 96–112)
Creatinine, Ser: 0.7 mg/dL (ref 0.4–1.2)
GFR: 112.89 mL/min (ref >60.00)
Glucose, Bld: 98 mg/dL (ref 70–99)
Potassium: 4.1 mEq/L (ref 3.5–5.1)
Sodium: 139 mEq/L (ref 135–145)

## 2010-07-24 LAB — HEPATIC FUNCTION PANEL
ALT: 23 U/L (ref 0–35)
AST: 19 U/L (ref 0–37)
Albumin: 4.1 g/dL (ref 3.5–5.2)
Alkaline Phosphatase: 59 U/L (ref 39–117)
Bilirubin, Direct: 0 mg/dL (ref 0.0–0.3)
Total Bilirubin: 0.4 mg/dL (ref 0.3–1.2)
Total Protein: 6.9 g/dL (ref 6.0–8.3)

## 2010-07-24 LAB — LDL CHOLESTEROL, DIRECT: Direct LDL: 205.5 mg/dL

## 2010-07-24 LAB — LIPID PANEL
Cholesterol: 350 mg/dL — ABNORMAL HIGH (ref 0–200)
HDL: 39.8 mg/dL (ref >39.00)
Total CHOL/HDL Ratio: 9
Triglycerides: 725 mg/dL — ABNORMAL HIGH (ref 0.0–149.0)
VLDL: 145 mg/dL — ABNORMAL HIGH (ref 0.0–40.0)

## 2010-07-24 LAB — TSH: TSH: 1.86 u[IU]/mL (ref 0.35–5.50)

## 2010-08-06 NOTE — Assessment & Plan Note (Signed)
[facility]                            [facility] OFFICE NOTE   Vanessa Price             MRN:          [id number]  DATE:09/14/2006                            DOB:          [birth year]    A 55 year old, African-American female seen today for a wellness exam.  She has hypertension and hyperlipidemia. She has been followed recently  by dermatology for alopecia. She has thyromegaly.   PAST MEDICAL HISTORY:  Pertinent for a hysterectomy in 2002. She is  followed by Dr. Elana Alm. She is a gravida 2, para 2, abortus 0. She has  no known drug allergies. She discontinued smoking a number of years ago.   MEDICATIONS:  1. Hydrochlorothiazide 25.  2. Simvastatin 40.   REVIEW OF SYSTEMS:  Negative. No prior GI screening.   FAMILY HISTORY:  Father died at 8 of an apparent esophageal rupture.  Mother has heart failure, diabetes and osteoarthritis.   PHYSICAL EXAMINATION:  GENERAL:  Revealed an overweight female in no  acute distress.  VITAL SIGNS:  Blood pressure was 150/90.  SKIN:  Negative. Did have some scalp alopecia.  HEAD/NECK:  Normal fundi. Ear, nose and throat clear. The soft palate  was low hanging.  NECK:  Revealed marked thyromegaly. No bruits.  CHEST:  Clear.  BREASTS:  Negative.  CARDIOVASCULAR:  Normal heart sounds, no murmurs.  ABDOMEN:  Obese, soft and nontender. Well healed surgical scar noted in  the lower midline. No bruits.  PELVIC/RECTAL:  Deferred.  EXTREMITIES:  Full peripheral pulses, no edema.  NEUROLOGIC:  Intact.   IMPRESSION:  1. Hypertension.  2. Hyperlipidemia.  3. Exogenous obesity.  4. Alopecia.   DISPOSITION:  Will followup with dermatology. Will change her  medications to lisinopril HCTZ 20/25. Recheck in 3 months.     Gordy Savers, MD  Electronically Signed    PFK/MedQ  DD: 09/14/2006  DT: 09/14/2006  Job #: [id number]

## 2010-08-09 NOTE — Discharge Summary (Signed)
[facility]  Patient:    Vanessa Price, Vanessa Price             MRN: [id number] Adm. Date:  [id number] Disc. Date: [id number] Attending:  Lendon Colonel                           Discharge Summary  ADMISSION DIAGNOSIS:  Uterine myoma, excessive menstruation, and history of anemia.  DISCHARGE DIAGNOSIS:  Uterine myoma, excessive menstruation, and history of anemia.  OPERATION PERFORMED:  Exploratory laparotomy, bilateral ovarian cystectomies.  HISTORY OF PRESENT ILLNESS:  Vanessa Price was followed for heavy periods, found to have an enlarging uterine fundus, and was advised to have a hysterectomy.  Alternative measures had been discussed with patient.  LABORATORY STUDIES:  Hemoglobin 9.2, hematocrit 28.  HOSPITAL COURSE:  Patient underwent an uneventful hysterectomy with removal of bilateral ovarian cysts which appeared to benign serocystadenomas, a cystadenofibroma on the right.  Uterine fibroid measured 471 g.  Her postoperative course was uncomplicated.  She remained afebrile and without complaints.  She was discharged no the second postoperative day to return to the office in about five days for wound care.  She was given Percocet for pain and told to eat a regular diet.  She is to call for fever or heavy bleeding, or any other problems she may have.  CONDITION ON DISCHARGE:  Improved. DD:  07/09/00 TD:  07/09/00 Job: [id number]

## 2010-08-09 NOTE — Op Note (Signed)
[facility]  Patient:    Vanessa Price, Vanessa Price             MRN: [id number] Proc. Date: 06/24/00 Adm. Date:  [id number] Attending:  Lendon Colonel                           Operative Report  PREOPERATIVE DIAGNOSES:  Large uterine fibroid, menorrhagia.  POSTOPERATIVE DIAGNOSES:  Larger uterine fibroid, menorrhagia, pelvic adhesions, bilateral ovarian cysts.  OPERATION:  Total abdominal hysterectomy, bilateral ovarian cystectomy, omentectomy partial, and lysis of adhesions.  DESCRIPTION OF PROCEDURE:  The patient was placed in lithotomy position and prepped and draped in the usual fashion.  The Foley catheter was inserted. The patient had had two previous midline incisions, and the midline was opened.  Hemostasis was accomplished with the Bovie.  The peritoneum was entered sharply.  Exploration of the upper abdomen revealed a normal gallbladder and liver.  Both kidneys appeared to be normal in position and size and shape.  No free fluid was noted within the abdominal cavity.  The right ovary contained a small, simple cyst that measured about 6 cm.  The left ovary had a paraovarian surface about a 4 cm cyst.  The left ovarian and paraovarian cyst was removed with the Bovie and 3-0 Vicryl suture.  The uteroovarian ligament was then clamped on the left and ligated with sutures of 0 chromic.  The right uteroovarian ligament was then cross-clamped and ligated.  The round ligaments on each side were suture ligated as well.  The broad ligament was clamped and ligated in succession with 0 chromic sutures. The peritoneum was entered anteriorly and the bladder dissected off the lower segment.  The uterine vessels were isolated and clamped and sutured with 0 chromic suture.  The fundectomy was then performed, and the cervical stump was removed using careful bites down the paracervical tissue including the cardinals and uterosacral ligaments.  Angles of the  vagina were then grasped, and then the cervix was removed.  The vagina was then closed with horizontal mattress sutures of 0 chromic.  This closed the vagina quite nicely. Hemostasis was secure.  The right ovary was delivered into the operative wound, and the ovarian cystectomy was performed.  The 3-0 Vicryl was used to close the ovarian cyst there.  Hemostasis was secured in this area.  Both ovaries were stable and hemostatically secured.  The omentum was then delivered and some oozing where I had lysed the adhesions from the top of the incision when we entered the abdomen, and I removed part of this omentum and sutured it with 3-0 Vicryl.  The parietal peritoneum was then closed with a 2-0 PDS.  The fascia was closed with figure-of-eight sutures of 0 Novofil with at least six knots on each suture.  Hemostasis was secure.  Prior to closing the abdomen, the abdomen was lavaged with copious amounts of Ringers Lactate as was the incision.  After the fascia was closed, the skin was closed with 3-0 nylon and clips.  The incision was then infiltrated with about 20 cc of 0.5% Xylocaine with epinephrine.  [name] tolerated the procedure well and was sent to recovery room in good condition. DD:  06/24/00 TD:  06/24/00 Job: [id number]

## 2010-08-09 NOTE — H&P (Signed)
[facility]  Patient:    Vanessa Price, Vanessa Price             MRN: [id number] Adm. Date:  [id number] Attending:  Lendon Colonel                         History and Physical  CHIEF COMPLAINT:  Continues heavy painful periods.  HISTORY OF PRESENT ILLNESS:  Ms. Vanessa Price is a 55 year old, gravida 5, para 2, 3 ABs, who presents hysterectomy for uterine fibroids, heavy periods, and anemia. She has been tried on birth control pills to no avail.  She has been followed in the office for several years with known uterine fibroids and gradually the uterus has enlarged to about a [redacted] weeks gestational size.  She has a history of two prior cesarean sections and a tubal ligation, and three therapeutic abortions.  CURRENT MEDICATIONS:  She currently takes no medications.  ALLERGIES:  No known allergies.  REVIEW OF SYSTEMS:  She has multiple seasonal allergies.  She wears glasses but has noted no decrease in visual or auditory acuity, freak dizziness or frequent headaches.  HEART:  No history of hypertension.  No rheumatic fever. No chest pain. No history of heart murmur.  LUNGS:  She presently has a nonproductive "allergic-type" cough.   No shortness of breath.  No history of pneumonia or asthma.  GENITOURINARY:  She denies stress incontinence. GASTROINTESTINAL:  No bowel or habit change.  No weight loss nor gain.  No history of melena.  MUSCLE, BONES, and JOINTS:  No fractures or arthritis.  SOCIAL HISTORY:  She works in the Audiological [employer].  She does not smoke nor drink.  FAMILY HISTORY:  Her mother is living at 71.  Her father died at 56.  One brother, who is living and well.  She has aunts and uncles with malignancies. Mother had congestive heart failure and her mother is diabetic.  PHYSICAL EXAMINATION:  VITAL SIGNS:  Examination reveals a weight of 199, a blood pressure of 140/80.  HEENT:  Examination of the eyes, ears, nose, and throat are  unremarkable. Oropharynx is not inflamed.  There is no postnasal drip.  Eyes:  Pupils are round and regular and react to light and accommodation.  NECK:  Supple.  Thyroid is not enlarged.  Carotid pulses are equal without bruits.  No adenopathy appreciated.  LUNGS:  Clear to P&A.  BREASTS:  No masses or tenderness.  HEART:  Normal sinus rhythm.  No murmurs.  No heaves, thrills, rubs, or gallops.  ABDOMEN:  Soft.  Liver, spleen, or kidneys are not palpated.  There is a low midline incision which is well-healed.  Bowel sounds are normal.  No tenderness is noted.  EXTREMITIES:  Show good range of motion, equal pulses and reflexes.  PELVIC:  Examination reveals a clean cervix.  Uterus is enlarged in the midplane with a posterior large myoma.  Estimated size is about 12 weeks. Adnexa negative.  NEUROLOGICAL:  Cranial nerves are intact.  The patient is alert, oriented to time, place, and recent events.  IMPRESSION:  Large symptomatic uterine fibroid with anemia unresponsive to conservative therapy, i.e. birth control pills.  PLAN:  Abdominal hysterectomy.  Risks and benefits in detail and informed consent have been given to Ms. Vanessa Price. DD:  06/23/00 TD:  06/24/00 Job: [id number]

## 2010-08-21 ENCOUNTER — Encounter: Payer: Self-pay | Admitting: Internal Medicine

## 2010-08-22 ENCOUNTER — Ambulatory Visit (INDEPENDENT_AMBULATORY_CARE_PROVIDER_SITE_OTHER): Payer: BC Managed Care – PPO | Admitting: Internal Medicine

## 2010-08-22 ENCOUNTER — Encounter: Payer: Self-pay | Admitting: Internal Medicine

## 2010-08-22 DIAGNOSIS — E785 Hyperlipidemia, unspecified: Secondary | ICD-10-CM

## 2010-08-22 DIAGNOSIS — E049 Nontoxic goiter, unspecified: Secondary | ICD-10-CM

## 2010-08-22 DIAGNOSIS — Z0000 Encounter for general adult medical examination without abnormal findings: Secondary | ICD-10-CM

## 2010-08-22 DIAGNOSIS — I10 Essential (primary) hypertension: Secondary | ICD-10-CM

## 2010-08-22 MED ORDER — ATORVASTATIN CALCIUM 20 MG PO TABS: 20.0000 mg | ORAL_TABLET | Freq: Every day | ORAL | Status: DC

## 2010-08-22 MED ORDER — OLMESARTAN MEDOXOMIL-HCTZ 40-12.5 MG PO TABS: 1.0000 | ORAL_TABLET | Freq: Every day | ORAL | Status: DC

## 2010-08-22 NOTE — Patient Instructions (Signed)
Limit your sodium (Salt) intake    It is important that you exercise regularly, at least 20 minutes 3 to 4 times per week.  If you develop chest pain or shortness of breath seek  medical attention.  You need to lose weight.  Consider a lower calorie diet and regular exercise.  Return in 3 months for follow-up  

## 2010-08-22 NOTE — Progress Notes (Signed)
  Subjective:    Patient ID: Vanessa Price, female    DOB: 03-Sep-1955, 55 y.o.   MRN: [id number]  HPI  55 year old patient who is seen today for an annual exam. She has been on Pravachol for dyslipidemia but discontinued this medication due to her constipation issues. She did tolerate Lipitor in the past but was switched to pravastatin due to cost considerations. Her lipid profile was reviewed and there was dramatic deterioration off statin therapy. She feels well today without concerns or complaints she has treated hypertension which has been well controlled on Benicar hydrochlorothiazide  Wt Readings from Last 3 Encounters:  08/22/10 196 lb (88.905 kg)  06/04/09 199 lb (90.266 kg)  11/20/08 197 lb (89.359 kg)     Review of Systems  Constitutional: Negative.   HENT: Negative for hearing loss, congestion, sore throat, rhinorrhea, dental problem, sinus pressure and tinnitus.   Eyes: Negative for pain, discharge and visual disturbance.  Respiratory: Negative for cough and shortness of breath.   Cardiovascular: Negative for chest pain, palpitations and leg swelling.  Gastrointestinal: Negative for nausea, vomiting, abdominal pain, diarrhea, constipation, blood in stool and abdominal distention.  Genitourinary: Negative for dysuria, urgency, frequency, hematuria, flank pain, vaginal bleeding, vaginal discharge, difficulty urinating, vaginal pain and pelvic pain.  Musculoskeletal: Negative for joint swelling, arthralgias and gait problem.  Skin: Negative for rash.  Neurological: Negative for dizziness, syncope, speech difficulty, weakness, numbness and headaches.  Hematological: Negative for adenopathy.  Psychiatric/Behavioral: Negative for behavioral problems, dysphoric mood and agitation. The patient is not nervous/anxious.        Objective:   Physical Exam  Constitutional: She is oriented to person, place, and time. She appears well-developed and well-nourished.  HENT:  Head:  Normocephalic and atraumatic.  Right Ear: External ear normal.  Left Ear: External ear normal.  Mouth/Throat: Oropharynx is clear and moist.  Eyes: Conjunctivae and EOM are normal.  Neck: Normal range of motion. Neck supple. No JVD present. No thyromegaly present.  Cardiovascular: Normal rate, regular rhythm, normal heart sounds and intact distal pulses.   No murmur heard. Pulmonary/Chest: Effort normal and breath sounds normal. She has no wheezes. She has no rales.  Abdominal: Soft. Bowel sounds are normal. She exhibits no distension and no mass. There is no tenderness. There is no rebound and no guarding.  Genitourinary: Vagina normal.  Musculoskeletal: Normal range of motion. She exhibits no edema and no tenderness.  Neurological: She is alert and oriented to person, place, and time. She has normal reflexes. No cranial nerve deficit. She exhibits normal muscle tone. Coordination normal.  Skin: Skin is warm and dry. No rash noted.  Psychiatric: She has a normal mood and affect. Her behavior is normal.          Assessment & Plan:   Annual clinical exam Dyslipidemia. Resume statin therapy with Lipitor 10 mg daily recheck a lipid profile in 3 months Hypertension stable we'll continue present regimen

## 2010-12-24 ENCOUNTER — Ambulatory Visit (INDEPENDENT_AMBULATORY_CARE_PROVIDER_SITE_OTHER): Payer: BC Managed Care – PPO

## 2010-12-24 DIAGNOSIS — Z23 Encounter for immunization: Secondary | ICD-10-CM

## 2010-12-25 ENCOUNTER — Ambulatory Visit: Payer: BC Managed Care – PPO

## 2011-02-07 LAB — HM MAMMOGRAPHY: HM Mammogram: NEGATIVE

## 2011-02-20 ENCOUNTER — Encounter: Payer: Self-pay | Admitting: Internal Medicine

## 2011-03-12 ENCOUNTER — Telehealth: Payer: Self-pay | Admitting: Internal Medicine

## 2011-03-12 NOTE — Telephone Encounter (Signed)
Pt called and said that [facility] is not going to cover her BENICAR HCT 40-25 MG per tablet through [facility], unless her insurance rcvs a call back from pcp stating that this medicine in medically necessary and req to get a coverage review. [phone] call between 8am and 9pm to req coverage review.

## 2011-03-13 NOTE — Telephone Encounter (Signed)
Attempt to call- ANS mach - LMTCB if questions - med should be covered at conversation hollie had with [employer] - should not be any problem filling unless she is trying to fill early

## 2011-03-13 NOTE — Telephone Encounter (Signed)
Have we recv'd/done per auth paperwork on this?

## 2011-03-13 NOTE — Telephone Encounter (Signed)
Kim, I called [employer] this morning and spoke with Jeannie C. She told me that it does not need a coverage review, that the Benicar HCT IS covered, and that they show a paid claim on 03/01/11. So, I'm not sure what the issue is that the pt is referring to. Now, if she just filled it 12/8 and is trying to fill it again, they'll reject it because it's too soon. Please advise. And thanks!

## 2011-03-13 NOTE — Telephone Encounter (Signed)
No - I haven't received a thing. I'll look into it. Thanks!

## 2011-05-30 ENCOUNTER — Telehealth: Payer: Self-pay | Admitting: *Deleted

## 2011-05-30 NOTE — Telephone Encounter (Signed)
Patient is calling because her "feet have been freezing" for about a week now.  She would like to know if she should stop her Benicar or Lipitor?

## 2011-05-30 NOTE — Telephone Encounter (Signed)
Attempt to call- VM - LMTCB , needs to be seen - last seen 07/2010 , dont stop any med with out dr. Amador Cunas seeing first to eval. Make sure there is nothing underlying. Call and make rov to f/u on meds and circulation

## 2011-08-04 ENCOUNTER — Encounter: Payer: Self-pay | Admitting: Internal Medicine

## 2011-08-04 ENCOUNTER — Ambulatory Visit (INDEPENDENT_AMBULATORY_CARE_PROVIDER_SITE_OTHER): Payer: BC Managed Care – PPO | Admitting: Internal Medicine

## 2011-08-04 VITALS — BP 130/80 | Temp 98.3°F | Wt 204.0 lb

## 2011-08-04 DIAGNOSIS — J069 Acute upper respiratory infection, unspecified: Secondary | ICD-10-CM

## 2011-08-04 MED ORDER — LOSARTAN POTASSIUM-HCTZ 100-25 MG PO TABS: 1.0000 | ORAL_TABLET | Freq: Every day | ORAL | Status: DC

## 2011-08-04 MED ORDER — TRIAMCINOLONE ACETONIDE 0.1 % EX CREA: TOPICAL_CREAM | Freq: Two times a day (BID) | CUTANEOUS | Status: AC

## 2011-08-04 NOTE — Patient Instructions (Signed)
DUEXIS  one twice daily  Nasal spray 2 puffs each side once daily  Call or return to clinic prn if these symptoms worsen or fail to improve as anticipated.

## 2011-08-04 NOTE — Progress Notes (Signed)
  Subjective:    Patient ID: Vanessa Price, female    DOB: October 08, 1955, 56 y.o.   MRN: [id number]  HPI  56 year old patient who presents with a one-week history of sore throat and nasal congestion. She's also noted a pruritic rash involving her left arm and left upper back. No fever or chills. No pertinent exposure history She has a history of hypertension treated with Benicar HCT she also has a history of ACE associated cough. She is objecting  to the cost of the Benicar   Review of Systems  Constitutional: Negative.  Negative for fever and chills.  HENT: Positive for congestion, sore throat and sinus pressure. Negative for hearing loss, rhinorrhea, dental problem and tinnitus.   Eyes: Negative for pain, discharge and visual disturbance.  Respiratory: Negative for cough and shortness of breath.   Cardiovascular: Negative for chest pain, palpitations and leg swelling.  Gastrointestinal: Negative for nausea, vomiting, abdominal pain, diarrhea, constipation, blood in stool and abdominal distention.  Genitourinary: Negative for dysuria, urgency, frequency, hematuria, flank pain, vaginal bleeding, vaginal discharge, difficulty urinating, vaginal pain and pelvic pain.  Musculoskeletal: Negative for joint swelling, arthralgias and gait problem.  Skin: Positive for rash.  Neurological: Negative for dizziness, syncope, speech difficulty, weakness, numbness and headaches.  Hematological: Negative for adenopathy.  Psychiatric/Behavioral: Negative for behavioral problems, dysphoric mood and agitation. The patient is not nervous/anxious.        Objective:   Physical Exam  Constitutional: She is oriented to person, place, and time. She appears well-developed and well-nourished.  HENT:  Head: Normocephalic.  Right Ear: External ear normal.  Left Ear: External ear normal.       Oropharynx erythematous  Eyes: Conjunctivae and EOM are normal. Pupils are equal, round, and reactive to light.    Neck: Normal range of motion. Neck supple. No thyromegaly present.  Cardiovascular: Normal rate, regular rhythm, normal heart sounds and intact distal pulses.   Pulmonary/Chest: Effort normal and breath sounds normal.  Abdominal: Soft. Bowel sounds are normal. She exhibits no mass. There is no tenderness.  Musculoskeletal: Normal range of motion.  Lymphadenopathy:    She has no cervical adenopathy.  Neurological: She is alert and oriented to person, place, and time.  Skin: Skin is warm and dry. Rash noted.  Psychiatric: She has a normal mood and affect. Her behavior is normal.          Assessment & Plan:   Viral pharyngitis with URI. Nonspecific dermatitis left arm probable mild contact dermatitis  We'll treat symptomatically

## 2011-08-19 ENCOUNTER — Telehealth: Payer: Self-pay | Admitting: *Deleted

## 2011-08-19 DIAGNOSIS — R21 Rash and other nonspecific skin eruption: Secondary | ICD-10-CM

## 2011-08-19 NOTE — Telephone Encounter (Signed)
Derm referral.

## 2011-08-19 NOTE — Telephone Encounter (Signed)
Saw dr Kirtland Bouchard about 1-2 weeks ago for rash-staters cream is not helping rash is worse and has spread and increased itching-wants to see derm

## 2011-08-19 NOTE — Telephone Encounter (Signed)
Please advise 

## 2011-08-20 NOTE — Telephone Encounter (Signed)
done

## 2011-08-26 ENCOUNTER — Other Ambulatory Visit: Payer: Self-pay | Admitting: Internal Medicine

## 2011-08-26 ENCOUNTER — Telehealth: Payer: Self-pay

## 2011-08-26 MED ORDER — PREDNISONE (PAK) 10 MG PO TABS: 10.0000 mg | ORAL_TABLET | Freq: Every day | ORAL | Status: AC

## 2011-08-26 NOTE — Telephone Encounter (Signed)
Took VM - wanting shot for itching ? Spoke with Dr. Amador Cunas - has derm referral in place with dr. Terri Piedra - can do depo shot or call out pred pak - attempt to call - LMTCB to discuss

## 2011-08-28 ENCOUNTER — Telehealth: Payer: Self-pay | Admitting: Internal Medicine

## 2011-08-28 NOTE — Telephone Encounter (Signed)
Patient called stating that her predisone should have been called into [facility] [address] [facility] as she needs this right away. Please assist.

## 2011-08-28 NOTE — Telephone Encounter (Signed)
Called in to [facility] 

## 2011-09-02 ENCOUNTER — Other Ambulatory Visit: Payer: Self-pay | Admitting: Internal Medicine

## 2011-10-09 ENCOUNTER — Ambulatory Visit (INDEPENDENT_AMBULATORY_CARE_PROVIDER_SITE_OTHER): Payer: 59 | Admitting: Gynecology

## 2011-10-09 ENCOUNTER — Encounter: Payer: Self-pay | Admitting: Gynecology

## 2011-10-09 ENCOUNTER — Other Ambulatory Visit (HOSPITAL_COMMUNITY)
Admission: RE | Admit: 2011-10-09 | Discharge: 2011-10-09 | Disposition: A | Discharge status: Home / Self Care | Payer: 59 | Source: Ambulatory Visit | Attending: Gynecology | Admitting: Gynecology

## 2011-10-09 VITALS — BP 124/74 | Ht 64.5 in | Wt 199.0 lb

## 2011-10-09 DIAGNOSIS — N898 Other specified noninflammatory disorders of vagina: Secondary | ICD-10-CM

## 2011-10-09 DIAGNOSIS — Z01419 Encounter for gynecological examination (general) (routine) without abnormal findings: Secondary | ICD-10-CM | POA: Insufficient documentation

## 2011-10-09 DIAGNOSIS — Z1151 Encounter for screening for human papillomavirus (HPV): Secondary | ICD-10-CM | POA: Insufficient documentation

## 2011-10-09 DIAGNOSIS — L816 Other disorders of diminished melanin formation: Secondary | ICD-10-CM

## 2011-10-09 DIAGNOSIS — L819 Disorder of pigmentation, unspecified: Secondary | ICD-10-CM

## 2011-10-09 DIAGNOSIS — Z780 Asymptomatic menopausal state: Secondary | ICD-10-CM

## 2011-10-09 LAB — WET PREP FOR TRICH, YEAST, CLUE
Clue Cells Wet Prep HPF POC: NONE SEEN
Trich, Wet Prep: NONE SEEN
Yeast Wet Prep HPF POC: NONE SEEN

## 2011-10-09 MED ORDER — METRONIDAZOLE 500 MG PO TABS: 500.0000 mg | ORAL_TABLET | Freq: Two times a day (BID) | ORAL | Status: DC

## 2011-10-09 MED ORDER — METRONIDAZOLE 500 MG PO TABS: 500.0000 mg | ORAL_TABLET | Freq: Two times a day (BID) | ORAL | Status: AC

## 2011-10-09 NOTE — Patient Instructions (Addendum)
Take Flagyl (metronidazole) medication twice daily for one week. Follow up for bone density as scheduled. Follow up for vulvar biopsy as scheduled.

## 2011-10-09 NOTE — Addendum Note (Signed)
Addended by: Dara Lords on: 10/09/2011 12:32 PM   Modules accepted: Orders

## 2011-10-09 NOTE — Progress Notes (Addendum)
Vanessa Price [birth year] [id number]    56 y.o.  [id number] new patient for annual exam.  Former patient of Dr. Algis Downs. [facility]. Several issues noted below.  Past medical history,surgical history, medications, allergies, family history and social history were all reviewed and documented in the EPIC chart. ROS:  Was performed and pertinent positives and negatives are included in the history.  Exam: Kim assistant Filed Vitals:   10/09/11 1132  BP: 124/74  Height: 5' 4.5" (1.638 m)  Weight: 199 lb (90.266 kg)   General appearance  Normal Skin grossly normal Head/Neck normal with no cervical or supraclavicular adenopathy thyroid normal Lungs  clear Cardiac RR, without RMG Abdominal  soft, nontender, without masses, organomegaly or hernia Breasts  examined lying and sitting without masses, retractions, discharge or axillary adenopathy. Pelvic  Ext/BUS/vagina  Symmetrical hypopigmentation from periclitoral region involving both labia majora and minora and perianal regions consistent with lichen sclerosus. Atrophic changes with loss of skin creases. Yellow abundant discharge. Irritative vaginal cuff changes with spotting with Pap smear. Pap/HPV done.  Adnexa  Without masses or tenderness    Anus and perineum  normal   Rectovaginal  normal sphincter tone without palpated masses or tenderness.    Assessment/Plan:  56 y.o. [id number] new patient for annual exam.  1. History of TAH. Had menorrhagia and leiomyoma. 2. Postmenopausal. Without significant symptoms.  Recommended baseline DEXA and patient will schedule. Increase calcium vitamin D reviewed. 3. Hypopigmentation of the vulva. Suspicious for lichen sclerosis. She is not having overt itching or other symptoms. Recommended baseline biopsy for diagnoses. Possibilities for high-dose steroid cream if symptomatic reviewed. Patient will make appointment follow up for the biopsy. 4. Vaginal discharge. No odor no itching. Wet prep consistent  with bacterial vaginosis. We'll treat with Flagyl 500 mg twice a day x7 days, alcohol points reviewed. Follow up if symptoms persist or recur. 5. Pap smear. Patient has no history of abnormal Pap smears with last Pap smear several years ago by Dr. Elana Alm. Current screening guidelines to include stop screening altogether given history of hysterectomy for benign indications reviewed. I did a baseline Pap smear today has had no records from her other Pap smears. Assuming this is negative then options for less frequent screening versus stopping altogether reviewed. 6. Mammography. Patient due now knows to schedule this and agrees to do so. SBE monthly reviewed. 7. Colonoscopy. Patient is up-to-date and will follow up with their recommended interval. 8. Health maintenance. No blood work was done today this is all done through her primary physician's office who follows her for her medical issues. Patient will follow for her vulvar biopsy and DEXA and we will go from there.    Dara Lords MD, 12:23 PM 10/09/2011

## 2011-10-10 ENCOUNTER — Telehealth: Payer: Self-pay | Admitting: Gynecology

## 2011-10-10 ENCOUNTER — Encounter: Payer: Self-pay | Admitting: Gynecology

## 2011-10-10 LAB — URINALYSIS W MICROSCOPIC + REFLEX CULTURE
Bilirubin Urine: NEGATIVE
Casts: NONE SEEN
Crystals: NONE SEEN
Glucose, UA: NEGATIVE mg/dL
Ketones, ur: NEGATIVE mg/dL
Nitrite: NEGATIVE
Protein, ur: NEGATIVE mg/dL
Specific Gravity, Urine: <1.005 — ABNORMAL LOW (ref 1.005–1.030)
Urobilinogen, UA: 0.2 mg/dL (ref 0.0–1.0)
WBC, UA: >50 WBC/hpf — AB (ref <3)
pH: 6 (ref 5.0–8.0)

## 2011-10-10 MED ORDER — SULFAMETHOXAZOLE-TMP DS 800-160 MG PO TABS: 1.0000 | ORAL_TABLET | Freq: Two times a day (BID) | ORAL | Status: AC

## 2011-10-10 NOTE — Telephone Encounter (Signed)
Tell patient urinalysis is consistent with UTI. I want to treat her with Septra DS one by mouth twice a day x3 days.

## 2011-10-10 NOTE — Telephone Encounter (Signed)
Pt informed with the below note. 

## 2011-10-11 LAB — URINE CULTURE: Colony Count: >=100000

## 2011-10-13 ENCOUNTER — Other Ambulatory Visit: Payer: Self-pay | Admitting: Gynecology

## 2011-10-13 DIAGNOSIS — N390 Urinary tract infection, site not specified: Secondary | ICD-10-CM

## 2011-11-11 ENCOUNTER — Ambulatory Visit: Payer: 59 | Admitting: Gynecology

## 2011-12-17 ENCOUNTER — Ambulatory Visit (INDEPENDENT_AMBULATORY_CARE_PROVIDER_SITE_OTHER): Payer: 59

## 2011-12-17 DIAGNOSIS — Z23 Encounter for immunization: Secondary | ICD-10-CM

## 2012-02-23 LAB — HM MAMMOGRAPHY

## 2012-02-24 ENCOUNTER — Encounter: Payer: Self-pay | Admitting: Gynecology

## 2012-03-01 ENCOUNTER — Encounter: Payer: Self-pay | Admitting: Internal Medicine

## 2012-04-12 ENCOUNTER — Ambulatory Visit (INDEPENDENT_AMBULATORY_CARE_PROVIDER_SITE_OTHER): Payer: 59 | Admitting: Internal Medicine

## 2012-04-12 ENCOUNTER — Encounter: Payer: Self-pay | Admitting: Internal Medicine

## 2012-04-12 VITALS — BP 132/80 | HR 83 | Temp 98.4°F | Resp 18 | Wt 200.0 lb

## 2012-04-12 DIAGNOSIS — J069 Acute upper respiratory infection, unspecified: Secondary | ICD-10-CM

## 2012-04-12 DIAGNOSIS — I10 Essential (primary) hypertension: Secondary | ICD-10-CM

## 2012-04-12 MED ORDER — HYDROCODONE-HOMATROPINE 5-1.5 MG/5ML PO SYRP: 5.0000 mL | ORAL_SOLUTION | Freq: Four times a day (QID) | ORAL | Status: AC | PRN

## 2012-04-12 NOTE — Patient Instructions (Addendum)
Acute bronchitis symptoms for less than 10 days are generally not helped by antibiotics.  Take over-the-counter expectorants and cough medications such as  Mucinex DM.  Call if there is no improvement in 5 to 7 days or if he developed worsening cough, fever, or new symptoms, such as shortness of breath or chest pain.  Please check your blood pressure on a regular basis.  If it is consistently greater than 150/90, please make an office appointment.  Return in 6 months for follow-up   DASH Diet The DASH diet stands for "Dietary Approaches to Stop Hypertension." It is a healthy eating plan that has been shown to reduce high blood pressure (hypertension) in as little as 14 days, while also possibly providing other significant health benefits. These other health benefits include reducing the risk of breast cancer after menopause and reducing the risk of type 2 diabetes, heart disease, colon cancer, and stroke. Health benefits also include weight loss and slowing kidney failure in patients with chronic kidney disease.   DIET GUIDELINES  Limit salt (sodium). Your diet should contain less than 1500 mg of sodium daily.   Limit refined or processed carbohydrates. Your diet should include mostly whole grains. Desserts and added sugars should be used sparingly.   Include small amounts of heart-healthy fats. These types of fats include nuts, oils, and tub margarine. Limit saturated and trans fats. These fats have been shown to be harmful in the body.  CHOOSING FOODS   The following food groups are based on a 2000 calorie diet. See your Registered Dietitian for individual calorie needs. Grains and Grain Products (6 to 8 servings daily)  Eat More Often: Whole-wheat bread, brown rice, whole-grain or wheat pasta, quinoa, popcorn without added fat or salt (air popped).   Eat Less Often: White bread, white pasta, white rice, cornbread.  Vegetables (4 to 5 servings daily)  Eat More Often: Fresh, frozen, and  canned vegetables. Vegetables may be raw, steamed, roasted, or grilled with a minimal amount of fat.   Eat Less Often/Avoid: Creamed or fried vegetables. Vegetables in a cheese sauce.  Fruit (4 to 5 servings daily)  Eat More Often: All fresh, canned (in natural juice), or frozen fruits. Dried fruits without added sugar. One hundred percent fruit juice ( cup [237 mL] daily).   Eat Less Often: Dried fruits with added sugar. Canned fruit in light or heavy syrup.  [employer], Fish, and Poultry (2 servings or less daily. One serving is 3 to 4 oz [85-114 g]).  Eat More Often: Ninety percent or leaner ground beef, tenderloin, sirloin. Round cuts of beef, chicken breast, [place] breast. All fish. Grill, bake, or broil your meat. Nothing should be fried.   Eat Less Often/Avoid: Fatty cuts of meat, [place], or chicken leg, thigh, or wing. Fried cuts of meat or fish.  Dairy (2 to 3 servings)  Eat More Often: Low-fat or fat-free milk, low-fat plain or light yogurt, reduced-fat or part-skim cheese.   Eat Less Often/Avoid: Milk (whole, 2%). Whole milk yogurt. Full-fat cheeses.  Nuts, Seeds, and Legumes (4 to 5 servings per week)  Eat More Often: All without added salt.   Eat Less Often/Avoid: Salted nuts and seeds, canned beans with added salt.  Fats and Sweets (limited)  Eat More Often: Vegetable oils, tub margarines without trans fats, sugar-free gelatin. Mayonnaise and salad dressings.   Eat Less Often/Avoid: Coconut oils, palm oils, butter, stick margarine, cream, half and half, cookies, candy, pie.  FOR MORE INFORMATION The  Dash Diet Eating Plan: www.dashdiet.org Document Released: 02/27/2011 Document Revised: 06/02/2011 Document Reviewed: 02/27/2011 [facility] Patient Information 2013 [place], [place].

## 2012-04-12 NOTE — Progress Notes (Signed)
Subjective:    Patient ID: Vanessa Price, female    DOB: [birth year], 57 y.o.   MRN: [id number]  HPI  57 year old patient who has a history of treated hypertension. She has not done well with generics and discontinue Benicar about 2 months ago do to cost considerations. Blood pressure today was well controlled She presents with a one-week history of chest congestion cough and headache. No fever  Past Medical History  Diagnosis Date  . ANXIETY 06/04/2009  . GERD 08/14/2008  . HYPERLIPIDEMIA 09/14/2006  . HYPERTENSION 09/14/2006  . MYOCARDIAL PERFUSION SCAN, WITH STRESS TEST, ABNORMAL 10/19/2008  . PREMATURE VENTRICULAR CONTRACTIONS 08/14/2008  . Impaired glucose tolerance   . Goiter, unspecified 01/04/2008    History   Social History  . Marital Status: Married    Spouse Name: N/A    Number of Children: N/A  . Years of Education: N/A   Occupational History  . Not on file.   Social History Main Topics  . Smoking status: Former Smoker    Quit date: 03/24/1993  . Smokeless tobacco: Never Used  . Alcohol Use: 0.5 oz/week    1 drink(s) per week     Comment: rare  . Drug Use: No  . Sexually Active: Yes    Birth Control/ Protection: Surgical   Other Topics Concern  . Not on file   Social History Narrative  . No narrative on file    Past Surgical History  Procedure Date  . Cesarean section   . Abdominal hysterectomy 1999    menorrhagia    Family History  Problem Relation Age of Onset  . Arthritis Mother   . Diabetes Mother   . Heart disease Mother   . Mental illness Father     Allergies  Allergen Reactions  . Lisinopril     REACTION: cough  . Risperidone     REACTION: unsure of dose; pt describes reaction of bad cough    Current Outpatient Prescriptions on File Prior to Visit  Medication Sig Dispense Refill  . atorvastatin (LIPITOR) 20 MG tablet TAKE 1 TABLET DAILY  90 tablet  3  . fluocinonide cream (LIDEX) 0.05 % Apply topically 2 (two) times  daily.      . hydrOXYzine (ATARAX/VISTARIL) 10 MG tablet Take 10 mg by mouth 3 (three) times daily as needed.      Marland Kitchen losartan-hydrochlorothiazide (HYZAAR) 100-25 MG per tablet Take 1 tablet by mouth daily.  90 tablet  3  . Omega-3 Fatty Acids (FISH OIL) 1000 MG CAPS Take 2 capsules by mouth daily.        Marland Kitchen triamcinolone cream (KENALOG) 0.1 % Apply topically 2 (two) times daily.  30 g  0    BP 132/80  Pulse 83  Temp 98.4 F (36.9 C) (Oral)  Resp 18  Wt 200 lb (90.719 kg)  SpO2 94%       Review of Systems  Constitutional: Positive for fatigue.  HENT: Negative for hearing loss, congestion, sore throat, rhinorrhea, dental problem, sinus pressure and tinnitus.   Eyes: Negative for pain, discharge and visual disturbance.  Respiratory: Positive for cough. Negative for shortness of breath.   Cardiovascular: Negative for chest pain, palpitations and leg swelling.  Gastrointestinal: Negative for nausea, vomiting, abdominal pain, diarrhea, constipation, blood in stool and abdominal distention.  Genitourinary: Negative for dysuria, urgency, frequency, hematuria, flank pain, vaginal bleeding, vaginal discharge, difficulty urinating, vaginal pain and pelvic pain.  Musculoskeletal: Negative for joint swelling, arthralgias and gait problem.  Skin: Negative  for rash.  Neurological: Negative for dizziness, syncope, speech difficulty, weakness, numbness and headaches.  Hematological: Negative for adenopathy.  Psychiatric/Behavioral: Negative for behavioral problems, dysphoric mood and agitation. The patient is not nervous/anxious.        Objective:   Physical Exam  Constitutional: She is oriented to person, place, and time. She appears well-developed and well-nourished.       Blood pressure 136/80  HENT:  Head: Normocephalic.  Right Ear: External ear normal.  Left Ear: External ear normal.  Mouth/Throat: Oropharynx is clear and moist.  Eyes: Conjunctivae normal and EOM are normal. Pupils  are equal, round, and reactive to light.  Neck: Normal range of motion. Neck supple. No thyromegaly present.  Cardiovascular: Normal rate, regular rhythm, normal heart sounds and intact distal pulses.   Pulmonary/Chest: Effort normal and breath sounds normal.  Abdominal: Soft. Bowel sounds are normal. She exhibits no mass. There is no tenderness.  Musculoskeletal: Normal range of motion.  Lymphadenopathy:    She has no cervical adenopathy.  Neurological: She is alert and oriented to person, place, and time.  Skin: Skin is warm and dry. No rash noted.  Psychiatric: She has a normal mood and affect. Her behavior is normal.          Assessment & Plan:   Viral URI with cough. We'll treat symptomatically Hypertension. Presently normal tensive off medication home blood pressure monitoring encouraged Dyslipidemia. Continue atorvastatin

## 2012-04-14 ENCOUNTER — Telehealth: Payer: Self-pay | Admitting: Internal Medicine

## 2012-04-14 NOTE — Telephone Encounter (Signed)
Patient Information:  Caller Name: Alexa  Phone: [phone]  Patient: Vanessa Price, Vanessa Price  Gender: Female  DOB: Sep 28, 1955  Age: 57 Years  PCP: Eleonore Chiquito [facility])  Office Follow Up:  Does the office need to follow up with this patient?: Yes  Instructions For The Office: Please call back ASAP to advise if can be worked in for office visit 04/14/12.  RN Note:  Feeling poorly. Diagnosed with bronchitis 04/12/12.  Frequent productive cough with brown mucus and headaches when moves.  Cough medication makes her drowsy. Unable to return to work due to cough and sore throat. Temp not checked;denies chills or sweats. Notes increased swelling on ankles for several months related to sitting all the time for work. Daughter reports audible wheezing.  Needs 15 to 20 minutes to get to office. No appointments remain for 04/14/12 with Dr Amador Cunas;  please call to advise if can be worked in for today.   Symptoms  Reason For Call & Symptoms: Cough is not improving; Diagnosed with bronchitis.  Unable to work.  Reviewed Health History In EMR: Yes  Reviewed Medications In EMR: Yes  Reviewed Allergies In EMR: Yes  Reviewed Surgeries / Procedures: Yes  Date of Onset of Symptoms: 04/06/2012  Treatments Tried: Hycodan cough mediction  Treatments Tried Worked: No  Guideline(s) Used:  Cough  Disposition Per Guideline:   Go to Office Now  Reason For Disposition Reached:   Wheezing is present  Advice Given:  N/A

## 2012-04-14 NOTE — Telephone Encounter (Signed)
Due to time, advised to go to UC now for evaluation.  No need to call patient back.

## 2012-04-14 NOTE — Telephone Encounter (Signed)
Noted  

## 2012-04-14 NOTE — Telephone Encounter (Signed)
Declined to be seen at [facility]; requested appointment for 04/15/12 in office.  Scheduled to see Dr Fabian Sharp at [facility] 04/15/12.

## 2012-04-16 ENCOUNTER — Ambulatory Visit: Payer: Self-pay | Admitting: Internal Medicine

## 2012-07-26 ENCOUNTER — Telehealth: Payer: Self-pay | Admitting: Internal Medicine

## 2012-07-26 NOTE — Telephone Encounter (Signed)
Patient Information:  Caller Name: Trista  Phone: [phone]  Patient: Vanessa Price, Vanessa Price  Gender: Female  DOB: 27-Nov-1955  Age: 57 Years  PCP: Eleonore Chiquito [facility])  Office Follow Up:  Does the office need to follow up with this patient?: Yes  Instructions For The Office: request rx. declines appt  RN Note:  Pharmacy [facility] [address].  Patient frustrated- she states she does not have $160 to give Dr. Kirtland Bouchard to see him and wants Rx.  Home care advise reviewed and call back parameters. Advised I would forward request to the office.  Please contact patient if you call in RX.  Symptoms  Reason For Call & Symptoms: Patient is complaining of allergy symptoms and is requesting Rx medication from Dr. Amador Cunas for relief.  Eyes irritated, sneezing , dry coughing . Onset 10 days ago. Zyrtec home treatment with no relief  Reviewed Health History In EMR: Yes  Reviewed Medications In EMR: Yes  Reviewed Allergies In EMR: Yes  Reviewed Surgeries / Procedures: Yes  Date of Onset of Symptoms: 07/16/2012  Treatments Tried: Zyrtec a couple days  Treatments Tried Worked: No  Guideline(s) Used:  Hay Fever - Nasal Allergies  Disposition Per Guideline:   See Today or Tomorrow in Office  Reason For Disposition Reached:   Moderate-Severe nasal allergy symptoms (i.e., interfere with sleep, school, or work) and taking antihistamines > 2 days  Advice Given:  Wash off Pollen Daily:  Remove pollen from the body with hair washing and a shower, especially before bedtime.  Avoiding Pollen:  Stay indoors on windy days  Keep windows closed in home, at least in bedroom; use air conditioner  Keep windows closed in car, turn AC on recirculate  Avoid playing with outdoor dog  For a Stuffy Nose - Use Nasal Washes:  Introduction: Saline (salt water) nasal irrigation (nasal washes) is an effective and simple home remedy for treating stuffy nose and sinus congestion. The  nose can be irrigated by pouring, spraying, or squirting salt water into the nose and then letting it run back out.  How it Helps: The salt water rinses out excess mucus, washes out any irritants (dust, allergens) that might be present, and moistens the nasal cavity.  For a Stuffy Nose - Use Nasal Washes:  Introduction: Saline (salt water) nasal irrigation (nasal washes) is an effective and simple home remedy for treating stuffy nose and sinus congestion. The nose can be irrigated by pouring, spraying, or squirting salt water into the nose and then letting it run back out.  How it Helps: The salt water rinses out excess mucus, washes out any irritants (dust, allergens) that might be present, and moistens the nasal cavity.  Methods: There are several ways to perform nasal irrigation. You can use a saline nasal spray bottle (available over-the-counter), a rubber ear syringe, a medical syringe without the needle, or a Neti Pot.  Step-By-Step Instructions:   Step 1: Lean over a sink.  Step 2: Gently squirt or spray warm salt water into one of your nostrils.  Step 3: Some of the water may run into the back of your throat. Spit this out. If you swallow the salt water it will not hurt you.  Step 4: Blow your nose to clean out the water and mucus.  Step 5: Repeat steps 1-4 for the other nostril. You can do this a couple times a day if it seems to help you.  Antihistamine Medications for Hay Fever:  Antihistamines help  reduce sneezing, itching, and runny nose.  You may need to take antihistamines continuously during pollen season (Reason: continuously is the key to control).  Loratadine is a newer (second generation) antihistamine. The dosage of loratadine (e.g., OTC Claritin, Alavert) is 10 mg once a day.  Cetirizine is a newer (second generation) antihistamine. The dosage of cetirizine (e.g., OTC Zyrtec) is 10 mg once a day.  For Eye Allergies:   For eye symptoms, wash pollen off the face and eyelids.  Then apply cold wet compresses. Oral antihistamines will usually bring all eye symptoms under control.  Call Back If:  Symptoms are not controlled in 2 days with continuous antihistamines  You become worse  RN Overrode Recommendation:  Patient Requests Prescription  request rx. declines appt

## 2012-07-26 NOTE — Telephone Encounter (Signed)
Okay to call in a prednisone Dosepak 5 mg 6 day

## 2012-07-27 MED ORDER — PREDNISONE (PAK) 10 MG PO TABS: 10.0000 mg | ORAL_TABLET | Freq: Every day | ORAL | Status: DC

## 2012-07-27 NOTE — Telephone Encounter (Signed)
Discussed with Dr. Amador Cunas prednisone dose not come in 5 mg dose pack only 10 mg okay to change to 10 mg. Verbal order given to change to Prednisone 10 mg dose pak.  Pt notified Rx for Prednisone was sent to pharmacy.

## 2013-01-07 ENCOUNTER — Ambulatory Visit (INDEPENDENT_AMBULATORY_CARE_PROVIDER_SITE_OTHER): Payer: 59 | Admitting: Gynecology

## 2013-01-07 ENCOUNTER — Ambulatory Visit (INDEPENDENT_AMBULATORY_CARE_PROVIDER_SITE_OTHER): Payer: 59

## 2013-01-07 ENCOUNTER — Encounter: Payer: Self-pay | Admitting: Gynecology

## 2013-01-07 VITALS — BP 114/70 | Ht 65.0 in | Wt 197.0 lb

## 2013-01-07 DIAGNOSIS — N898 Other specified noninflammatory disorders of vagina: Secondary | ICD-10-CM

## 2013-01-07 DIAGNOSIS — R21 Rash and other nonspecific skin eruption: Secondary | ICD-10-CM

## 2013-01-07 DIAGNOSIS — Z01419 Encounter for gynecological examination (general) (routine) without abnormal findings: Secondary | ICD-10-CM

## 2013-01-07 DIAGNOSIS — Z23 Encounter for immunization: Secondary | ICD-10-CM

## 2013-01-07 DIAGNOSIS — Z113 Encounter for screening for infections with a predominantly sexual mode of transmission: Secondary | ICD-10-CM

## 2013-01-07 DIAGNOSIS — N952 Postmenopausal atrophic vaginitis: Secondary | ICD-10-CM

## 2013-01-07 LAB — WET PREP FOR TRICH, YEAST, CLUE
Clue Cells Wet Prep HPF POC: NONE SEEN
Trich, Wet Prep: NONE SEEN

## 2013-01-07 MED ORDER — NYSTATIN-TRIAMCINOLONE 100000-0.1 UNIT/GM-% EX OINT: TOPICAL_OINTMENT | Freq: Two times a day (BID) | CUTANEOUS | Status: DC

## 2013-01-07 MED ORDER — FLUCONAZOLE 150 MG PO TABS: 150.0000 mg | ORAL_TABLET | Freq: Once | ORAL | Status: DC

## 2013-01-07 NOTE — Progress Notes (Signed)
Vanessa Price [birth year] [id number]        57 y.o.  [id number] for annual exam.  Several issues noted below.  Past medical history,surgical history, medications, allergies, family history and social history were all reviewed and documented in the EPIC chart.  ROS:  Performed and pertinent positives and negatives are included in the history, assessment and plan .  Exam: Kim assistant Filed Vitals:   01/07/13 1017  BP: 114/70  Height: 5\' 5"  (1.651 m)  Weight: 197 lb (89.359 kg)   General appearance  Normal Skin grossly normal Head/Neck normal with no cervical or supraclavicular adenopathy thyroid normal Lungs  clear Cardiac RR, without RMG Abdominal  soft, nontender, without masses, organomegaly or hernia. Slight rash in her panniculus fold consistent with yeast Breasts  examined lying and sitting without masses, retractions, discharge or axillary adenopathy. Pelvic  Ext/BUS/vagina  symmetrical vitiligo-like changes surrounding introital opening from periclitoral region the perianal region, white discharge noted   Adnexa  Without masses or tenderness    Anus and perineum  normal   Rectovaginal  normal sphincter tone without palpated masses or tenderness.    Assessment/Plan:  57 y.o. [id number] female for annual exam.   1. Status post TAH by Dr. Elana Alm. Doing well without significant symptoms such as hot flashes night sweats vaginal dryness or dyspareunia. We'll continue to monitor. 2. Vitiligo changes around the introital opening. I had recommended biopsy last year to rule out lichen sclerosis. She is not having any irritation or symptoms. Patient never followed up for biopsy but she thinks it has always been this way. Again in the absence of biopsy I discussed that we cannot rule out pathology she understands this but declines biopsy at this time. She will watch it and his lungs remained stable then she will follow. 3. Skin rash in the panniculus fold. Consistent with yeast.  Recommend Mytrex when necessary and prescription was provided with refills. Followup if symptoms persist. 4. History of trichomonas last year treated with Flagyl. Slight white discharge noted. Wet prep consistent with yeast. Treat with Diflucan 150 mg x1 dose. GC chlamydia screen of the vaginal cuff and urethra done. 5. Pap smear of the vaginal cuff 2013 normal. No Pap smear done today. Options to stop screening altogether as she has no history of abnormal Pap smears and had a hysterectomy for benign indications versus less frequent screening intervals reviewed. We'll readdress on an annual basis. 6. Mammography 02/2012. Patient knows to scheduled this coming December. SBE monthly reviewed. 7. Colonoscopy 2009. Followup at their recommended interval. 8. DEXA never. I have suggested baseline last year but she never followed up. She declines to do one now. Silent nature of osteopenia/osteoporosis reviewed but again she would prefer to wait later into the 60s. Increase calcium vitamin D reviewed. 9. Health maintenance. No blood work done as this is all done through her other physician's offices who she sees routinely for her medical issues. Followup one year, sooner as needed.  Note: This document was prepared with digital dictation and possible smart phrase technology. Any transcriptional errors that result from this process are unintentional.   Dara Lords MD, 11:01 AM 01/07/2013

## 2013-01-07 NOTE — Patient Instructions (Signed)
Take Diflucan pill. Use prescribed cream in the abdominal fold for the rash. Follow with the whitish changes around the vagina. His lungs are remain stable then we'll follow. If any issues then followup with me. Followup in one year for annual exam.

## 2013-01-08 LAB — GC/CHLAMYDIA PROBE AMP
CT Probe RNA: NEGATIVE
GC Probe RNA: NEGATIVE

## 2013-01-08 LAB — URINALYSIS W MICROSCOPIC + REFLEX CULTURE
Bilirubin Urine: NEGATIVE
Casts: NONE SEEN
Crystals: NONE SEEN
Glucose, UA: NEGATIVE mg/dL
Hgb urine dipstick: NEGATIVE
Ketones, ur: NEGATIVE mg/dL
Leukocytes, UA: NEGATIVE
Nitrite: NEGATIVE
Protein, ur: NEGATIVE mg/dL
Specific Gravity, Urine: 1.017 (ref 1.005–1.030)
Urobilinogen, UA: 0.2 mg/dL (ref 0.0–1.0)
pH: 6.5 (ref 5.0–8.0)

## 2013-02-07 ENCOUNTER — Other Ambulatory Visit (INDEPENDENT_AMBULATORY_CARE_PROVIDER_SITE_OTHER): Payer: 59

## 2013-02-07 DIAGNOSIS — Z0000 Encounter for general adult medical examination without abnormal findings: Secondary | ICD-10-CM

## 2013-02-07 LAB — CBC WITH DIFFERENTIAL/PLATELET
Basophils Absolute: 0 10*3/uL (ref 0.0–0.1)
Basophils Relative: 0.8 % (ref 0.0–3.0)
Eosinophils Absolute: 0.1 10*3/uL (ref 0.0–0.7)
Eosinophils Relative: 2.7 % (ref 0.0–5.0)
HCT: 39.3 % (ref 36.0–46.0)
Hemoglobin: 13.4 g/dL (ref 12.0–15.0)
Lymphocytes Relative: 36.8 % (ref 12.0–46.0)
Lymphs Abs: 1.6 10*3/uL (ref 0.7–4.0)
MCHC: 34.1 g/dL (ref 30.0–36.0)
MCV: 82 fl (ref 78.0–100.0)
Monocytes Absolute: 0.2 10*3/uL (ref 0.1–1.0)
Monocytes Relative: 4.6 % (ref 3.0–12.0)
Neutro Abs: 2.4 10*3/uL (ref 1.4–7.7)
Neutrophils Relative %: 55.1 % (ref 43.0–77.0)
Platelets: 230 10*3/uL (ref 150.0–400.0)
RBC: 4.79 Mil/uL (ref 3.87–5.11)
RDW: 15.3 % — ABNORMAL HIGH (ref 11.5–14.6)
WBC: 4.4 10*3/uL — ABNORMAL LOW (ref 4.5–10.5)

## 2013-02-07 LAB — POCT URINALYSIS DIPSTICK
Bilirubin, UA: NEGATIVE
Glucose, UA: NEGATIVE
Ketones, UA: NEGATIVE
Leukocytes, UA: NEGATIVE
Nitrite, UA: NEGATIVE
Protein, UA: NEGATIVE
Spec Grav, UA: 1.02
Urobilinogen, UA: 0.2
pH, UA: 6

## 2013-02-07 LAB — HEPATIC FUNCTION PANEL
ALT: 19 U/L (ref 0–35)
AST: 17 U/L (ref 0–37)
Albumin: 4.1 g/dL (ref 3.5–5.2)
Alkaline Phosphatase: 78 U/L (ref 39–117)
Bilirubin, Direct: 0 mg/dL (ref 0.0–0.3)
Total Bilirubin: 0.1 mg/dL — ABNORMAL LOW (ref 0.3–1.2)
Total Protein: 7.4 g/dL (ref 6.0–8.3)

## 2013-02-07 LAB — TSH: TSH: 1.7 u[IU]/mL (ref 0.35–5.50)

## 2013-02-07 LAB — LIPID PANEL
Cholesterol: 317 mg/dL — ABNORMAL HIGH (ref 0–200)
HDL: 35.4 mg/dL — ABNORMAL LOW (ref >39.00)
Total CHOL/HDL Ratio: 9
Triglycerides: 751 mg/dL — ABNORMAL HIGH (ref 0.0–149.0)
VLDL: 150.2 mg/dL — ABNORMAL HIGH (ref 0.0–40.0)

## 2013-02-07 LAB — BASIC METABOLIC PANEL
BUN: 11 mg/dL (ref 6–23)
CO2: 25 mEq/L (ref 19–32)
Calcium: 9.2 mg/dL (ref 8.4–10.5)
Chloride: 106 mEq/L (ref 96–112)
Creatinine, Ser: 0.6 mg/dL (ref 0.4–1.2)
GFR: 126.59 mL/min (ref >60.00)
Glucose, Bld: 100 mg/dL — ABNORMAL HIGH (ref 70–99)
Potassium: 3.4 mEq/L — ABNORMAL LOW (ref 3.5–5.1)
Sodium: 139 mEq/L (ref 135–145)

## 2013-02-07 LAB — LDL CHOLESTEROL, DIRECT: Direct LDL: 165.6 mg/dL

## 2013-02-14 ENCOUNTER — Ambulatory Visit (INDEPENDENT_AMBULATORY_CARE_PROVIDER_SITE_OTHER): Payer: 59 | Admitting: Internal Medicine

## 2013-02-14 ENCOUNTER — Encounter: Payer: Self-pay | Admitting: Internal Medicine

## 2013-02-14 VITALS — BP 150/90 | HR 84 | Temp 97.8°F | Resp 20 | Ht 64.0 in | Wt 199.0 lb

## 2013-02-14 DIAGNOSIS — Z0000 Encounter for general adult medical examination without abnormal findings: Secondary | ICD-10-CM

## 2013-02-14 DIAGNOSIS — E785 Hyperlipidemia, unspecified: Secondary | ICD-10-CM

## 2013-02-14 DIAGNOSIS — E049 Nontoxic goiter, unspecified: Secondary | ICD-10-CM

## 2013-02-14 DIAGNOSIS — I10 Essential (primary) hypertension: Secondary | ICD-10-CM

## 2013-02-14 NOTE — Progress Notes (Signed)
Subjective:    Patient ID: Vanessa Price, female    DOB: [birth year], 57 y.o.   MRN: [id number]  HPI Pre-visit discussion using our clinic review tool. No additional management support is needed unless otherwise documented below in the visit note.  57 year old patient who is seen today for a wellness exam. She has a history of hypertension but presently managed off medication. He does monitor home blood pressure readings. She has been seen recently by gynecology. She has a history of dyslipidemia and has been treated with atorvastatin in the past she is fearful of statin side effects. She does have a history of exogenous obesity.  Past Medical History  Diagnosis Date  . ANXIETY 06/04/2009  . GERD 08/14/2008  . HYPERLIPIDEMIA 09/14/2006  . HYPERTENSION 09/14/2006  . MYOCARDIAL PERFUSION SCAN, WITH STRESS TEST, ABNORMAL 10/19/2008  . PREMATURE VENTRICULAR CONTRACTIONS 08/14/2008  . Impaired glucose tolerance   . Goiter, unspecified 01/04/2008    History   Social History  . Marital Status: Married    Spouse Name: N/A    Number of Children: N/A  . Years of Education: N/A   Occupational History  . Not on file.   Social History Main Topics  . Smoking status: Former Smoker    Quit date: 03/24/1993  . Smokeless tobacco: Never Used  . Alcohol Use: 0.5 oz/week    1 drink(s) per week     Comment: rare  . Drug Use: No  . Sexual Activity: Yes    Birth Control/ Protection: Surgical     Comment: HYst   Other Topics Concern  . Not on file   Social History Narrative  . No narrative on file    Past Surgical History  Procedure Laterality Date  . Cesarean section    . Abdominal hysterectomy  1999    menorrhagia    Family History  Problem Relation Age of Onset  . Arthritis Mother   . Diabetes Mother   . Heart disease Mother   . Mental illness Father     Allergies  Allergen Reactions  . Lisinopril     REACTION: cough  . Risperidone     REACTION: unsure of dose; pt  describes reaction of bad cough    No current outpatient prescriptions on file prior to visit.   No current facility-administered medications on file prior to visit.    BP 150/90  Pulse 84  Temp(Src) 97.8 F (36.6 C) (Oral)  Resp 20  Ht 5\' 4"  (1.626 m)  Wt 199 lb (90.266 kg)  BMI 34.14 kg/m2       Review of Systems  Constitutional: Negative for fever, appetite change, fatigue and unexpected weight change.  HENT: Negative for congestion, dental problem, ear pain, hearing loss, mouth sores, nosebleeds, sinus pressure, sore throat, tinnitus, trouble swallowing and voice change.   Eyes: Negative for photophobia, pain, redness and visual disturbance.  Respiratory: Negative for cough, chest tightness and shortness of breath.   Cardiovascular: Negative for chest pain, palpitations and leg swelling.  Gastrointestinal: Negative for nausea, vomiting, abdominal pain, diarrhea, constipation, blood in stool, abdominal distention and rectal pain.  Genitourinary: Negative for dysuria, urgency, frequency, hematuria, flank pain, vaginal bleeding, vaginal discharge, difficulty urinating, genital sores, vaginal pain, menstrual problem and pelvic pain.  Musculoskeletal: Negative for arthralgias, back pain and neck stiffness.  Skin: Negative for rash.  Neurological: Negative for dizziness, syncope, speech difficulty, weakness, light-headedness, numbness and headaches.  Hematological: Negative for adenopathy. Does not bruise/bleed easily.  Psychiatric/Behavioral: Negative for suicidal  ideas, behavioral problems, self-injury, dysphoric mood and agitation. The patient is not nervous/anxious.        Objective:   Physical Exam  Constitutional: She is oriented to person, place, and time. She appears well-developed and well-nourished.  HENT:  Head: Normocephalic and atraumatic.  Right Ear: External ear normal.  Left Ear: External ear normal.  Mouth/Throat: Oropharynx is clear and moist.  Eyes:  Conjunctivae and EOM are normal.  Neck: Normal range of motion. Neck supple. No JVD present. No thyromegaly present.  Cardiovascular: Normal rate, regular rhythm, normal heart sounds and intact distal pulses.   No murmur heard. Pulmonary/Chest: Effort normal and breath sounds normal. She has no wheezes. She has no rales.  Abdominal: Soft. Bowel sounds are normal. She exhibits no distension and no mass. There is no tenderness. There is no rebound and no guarding.  Musculoskeletal: Normal range of motion. She exhibits no edema and no tenderness.  Neurological: She is alert and oriented to person, place, and time. She has normal reflexes. No cranial nerve deficit. She exhibits normal muscle tone. Coordination normal.  Skin: Skin is warm and dry. No rash noted.  Psychiatric: She has a normal mood and affect. Her behavior is normal.          Assessment & Plan:   Preventive health exam Hypertension. Repeat blood pressure 130/80.  We'll continue aggressive efforts at weight loss exercise and continue home blood pressure monitoring. She'll call if blood pressures consistently above 140/90 Exogenous obesity. Weight loss encouraged Dyslipidemia.  Patient does not meet current guidelines for treating with a CHD risk of 5%. strongly opposed to statin therapy  due to concern of precipitating diabetes

## 2013-02-14 NOTE — Progress Notes (Signed)
Pre-visit discussion using our clinic review tool. No additional management support is needed unless otherwise documented below in the visit note.  

## 2013-02-14 NOTE — Patient Instructions (Signed)
Limit your sodium (Salt) intake  Please check your blood pressure on a regular basis.  If it is consistently greater than 150/90, please make an office appointment.  Return in one year for follow-up  You need to lose weight.  Consider a lower calorie diet and regular exercise.  DASH Diet The DASH diet stands for "Dietary Approaches to Stop Hypertension." It is a healthy eating plan that has been shown to reduce high blood pressure (hypertension) in as little as 14 days, while also possibly providing other significant health benefits. These other health benefits include reducing the risk of breast cancer after menopause and reducing the risk of type 2 diabetes, heart disease, colon cancer, and stroke. Health benefits also include weight loss and slowing kidney failure in patients with chronic kidney disease.  DIET GUIDELINES  Limit salt (sodium). Your diet should contain less than 1500 mg of sodium daily.  Limit refined or processed carbohydrates. Your diet should include mostly whole grains. Desserts and added sugars should be used sparingly.  Include small amounts of heart-healthy fats. These types of fats include nuts, oils, and tub margarine. Limit saturated and trans fats. These fats have been shown to be harmful in the body. CHOOSING FOODS  The following food groups are based on a 2000 calorie diet. See your Registered Dietitian for individual calorie needs. Grains and Grain Products (6 to 8 servings daily)  Eat More Often: Whole-wheat bread, brown rice, whole-grain or wheat pasta, quinoa, popcorn without added fat or salt (air popped).  Eat Less Often: White bread, white pasta, white rice, cornbread. Vegetables (4 to 5 servings daily)  Eat More Often: Fresh, frozen, and canned vegetables. Vegetables may be raw, steamed, roasted, or grilled with a minimal amount of fat.  Eat Less Often/Avoid: Creamed or fried vegetables. Vegetables in a cheese sauce. Fruit (4 to 5 servings  daily)  Eat More Often: All fresh, canned (in natural juice), or frozen fruits. Dried fruits without added sugar. One hundred percent fruit juice ( cup [237 mL] daily).  Eat Less Often: Dried fruits with added sugar. Canned fruit in light or heavy syrup. [employer], Fish, and Poultry (2 servings or less daily. One serving is 3 to 4 oz [85-114 g]).  Eat More Often: Ninety percent or leaner ground beef, tenderloin, sirloin. Round cuts of beef, chicken breast, [place] breast. All fish. Grill, bake, or broil your meat. Nothing should be fried.  Eat Less Often/Avoid: Fatty cuts of meat, [place], or chicken leg, thigh, or wing. Fried cuts of meat or fish. Dairy (2 to 3 servings)  Eat More Often: Low-fat or fat-free milk, low-fat plain or light yogurt, reduced-fat or part-skim cheese.  Eat Less Often/Avoid: Milk (whole, 2%).Whole milk yogurt. Full-fat cheeses. Nuts, Seeds, and Legumes (4 to 5 servings per week)  Eat More Often: All without added salt.  Eat Less Often/Avoid: Salted nuts and seeds, canned beans with added salt. Fats and Sweets (limited)  Eat More Often: Vegetable oils, tub margarines without trans fats, sugar-free gelatin. Mayonnaise and salad dressings.  Eat Less Often/Avoid: Coconut oils, palm oils, butter, stick margarine, cream, half and half, cookies, candy, pie. FOR MORE INFORMATION The Dash Diet Eating Plan: www.dashdiet.org Document Released: 02/27/2011 Document Revised: 06/02/2011 Document Reviewed: 02/27/2011 [facility] Patient Information 2014 [place], [place].

## 2013-03-21 ENCOUNTER — Encounter: Payer: Self-pay | Admitting: Gynecology

## 2013-03-23 LAB — HM MAMMOGRAPHY

## 2013-03-25 ENCOUNTER — Other Ambulatory Visit: Payer: Self-pay | Admitting: *Deleted

## 2013-03-25 DIAGNOSIS — R928 Other abnormal and inconclusive findings on diagnostic imaging of breast: Secondary | ICD-10-CM

## 2013-03-29 ENCOUNTER — Other Ambulatory Visit: Payer: Self-pay | Admitting: *Deleted

## 2013-03-29 ENCOUNTER — Encounter: Payer: Self-pay | Admitting: Internal Medicine

## 2013-03-29 DIAGNOSIS — R928 Other abnormal and inconclusive findings on diagnostic imaging of breast: Secondary | ICD-10-CM

## 2013-09-21 ENCOUNTER — Encounter: Payer: Self-pay | Admitting: Gynecology

## 2013-11-07 ENCOUNTER — Ambulatory Visit (INDEPENDENT_AMBULATORY_CARE_PROVIDER_SITE_OTHER): Payer: 59 | Admitting: Internal Medicine

## 2013-11-07 ENCOUNTER — Encounter: Payer: Self-pay | Admitting: Internal Medicine

## 2013-11-07 VITALS — BP 140/90 | HR 90 | Temp 98.4°F | Resp 20 | Ht 64.0 in | Wt 200.0 lb

## 2013-11-07 DIAGNOSIS — B9789 Other viral agents as the cause of diseases classified elsewhere: Secondary | ICD-10-CM

## 2013-11-07 DIAGNOSIS — I10 Essential (primary) hypertension: Secondary | ICD-10-CM

## 2013-11-07 DIAGNOSIS — J069 Acute upper respiratory infection, unspecified: Secondary | ICD-10-CM

## 2013-11-07 MED ORDER — HYDROCODONE-HOMATROPINE 5-1.5 MG/5ML PO SYRP: 5.0000 mL | ORAL_SOLUTION | Freq: Four times a day (QID) | ORAL | Status: DC | PRN

## 2013-11-07 NOTE — Progress Notes (Signed)
Pre visit review using our clinic review tool, if applicable. No additional management support is needed unless otherwise documented below in the visit note. 

## 2013-11-07 NOTE — Progress Notes (Signed)
Subjective:    Patient ID: Vanessa Price, female    DOB: Aug 23, 1955, 58 y.o.   MRN: [id number]  HPI 58 year old patient who has treated hypertension.  She presents with a four-day history of mild productive cough.  No fever.  She also complains of mild left-sided sore throat.  No shortness of breath, wheezing, or chest pain.  Past Medical History  Diagnosis Date  . ANXIETY 06/04/2009  . GERD 08/14/2008  . HYPERLIPIDEMIA 09/14/2006  . HYPERTENSION 09/14/2006  . MYOCARDIAL PERFUSION SCAN, WITH STRESS TEST, ABNORMAL 10/19/2008  . PREMATURE VENTRICULAR CONTRACTIONS 08/14/2008  . Impaired glucose tolerance   . Goiter, unspecified 01/04/2008    History   Social History  . Marital Status: Married    Spouse Name: N/A    Number of Children: N/A  . Years of Education: N/A   Occupational History  . Not on file.   Social History Main Topics  . Smoking status: Former Smoker    Quit date: 03/24/1993  . Smokeless tobacco: Never Used  . Alcohol Use: 0.5 oz/week    1 drink(s) per week     Comment: rare  . Drug Use: No  . Sexual Activity: Yes    Birth Control/ Protection: Surgical     Comment: HYst   Other Topics Concern  . Not on file   Social History Narrative  . No narrative on file    Past Surgical History  Procedure Laterality Date  . Cesarean section    . Abdominal hysterectomy  1999    menorrhagia    Family History  Problem Relation Age of Onset  . Arthritis Mother   . Diabetes Mother   . Heart disease Mother   . Mental illness Father     Allergies  Allergen Reactions  . Lisinopril     REACTION: cough  . Risperidone     REACTION: unsure of dose; pt describes reaction of bad cough    No current outpatient prescriptions on file prior to visit.   No current facility-administered medications on file prior to visit.    BP 160/90  Pulse 90  Temp(Src) 98.4 F (36.9 C) (Oral)  Resp 20  Ht 5\' 4"  (1.626 m)  Wt 200 lb (90.719 kg)  BMI 34.31 kg/m2   SpO2 97%      Review of Systems  Constitutional: Positive for activity change and fatigue.  HENT: Positive for sore throat. Negative for congestion, dental problem, hearing loss, rhinorrhea, sinus pressure and tinnitus.   Eyes: Negative for pain, discharge and visual disturbance.  Respiratory: Positive for cough. Negative for shortness of breath.   Cardiovascular: Negative for chest pain, palpitations and leg swelling.  Gastrointestinal: Negative for nausea, vomiting, abdominal pain, diarrhea, constipation, blood in stool and abdominal distention.  Genitourinary: Negative for dysuria, urgency, frequency, hematuria, flank pain, vaginal bleeding, vaginal discharge, difficulty urinating, vaginal pain and pelvic pain.  Musculoskeletal: Negative for arthralgias, gait problem and joint swelling.  Skin: Negative for rash.  Neurological: Negative for dizziness, syncope, speech difficulty, weakness, numbness and headaches.  Hematological: Negative for adenopathy.  Psychiatric/Behavioral: Negative for behavioral problems, dysphoric mood and agitation. The patient is not nervous/anxious.        Objective:   Physical Exam  Constitutional: She is oriented to person, place, and time. She appears well-developed and well-nourished.  HENT:  Head: Normocephalic.  Right Ear: External ear normal.  Left Ear: External ear normal.  Low hanging soft palate Oropharynx slightly erythematous  Eyes: Conjunctivae and EOM are normal.  Pupils are equal, round, and reactive to light.  Neck: Normal range of motion. Neck supple. No thyromegaly present.  Cardiovascular: Normal rate, regular rhythm, normal heart sounds and intact distal pulses.   Pulmonary/Chest: Effort normal and breath sounds normal. No respiratory distress. She has no wheezes. She has no rales.  Abdominal: Soft. Bowel sounds are normal. She exhibits no mass. There is no tenderness.  Musculoskeletal: Normal range of motion.  Lymphadenopathy:     She has no cervical adenopathy.  Neurological: She is alert and oriented to person, place, and time.  Skin: Skin is warm and dry. No rash noted.  Psychiatric: She has a normal mood and affect. Her behavior is normal.          Assessment & Plan:   Viral URI with cough Hypertension.  Repeat blood pressure 140/90  We'll treat symptomatically

## 2013-11-07 NOTE — Patient Instructions (Addendum)
Acute bronchitis symptoms for less than 10 days are generally not helped by antibiotics.  Take over-the-counter expectorants and cough medications such as  Mucinex DM.  Call if there is no improvement in 5 to 7 days or if  you develop worsening cough, fever, or new symptoms, such as shortness of breath or chest pain.  Limit your sodium (Salt) intake  Please check your blood pressure on a regular basis.  If it is consistently greater than 150/90, please make an office appointment.  Return in 6 months for follow-up

## 2013-11-08 ENCOUNTER — Telehealth: Payer: Self-pay | Admitting: Internal Medicine

## 2013-11-08 NOTE — Telephone Encounter (Signed)
Relevant patient education mailed to patient.  

## 2014-01-23 ENCOUNTER — Encounter: Payer: Self-pay | Admitting: Gynecology

## 2014-01-23 ENCOUNTER — Ambulatory Visit (INDEPENDENT_AMBULATORY_CARE_PROVIDER_SITE_OTHER): Payer: 59 | Admitting: Gynecology

## 2014-01-23 VITALS — BP 124/80 | Ht 64.5 in | Wt 197.0 lb

## 2014-01-23 DIAGNOSIS — L80 Vitiligo: Secondary | ICD-10-CM

## 2014-01-23 DIAGNOSIS — Z01419 Encounter for gynecological examination (general) (routine) without abnormal findings: Secondary | ICD-10-CM

## 2014-01-23 NOTE — Patient Instructions (Signed)
You may obtain a copy of any labs that were done today by logging onto MyChart as outlined in the instructions provided with your AVS (after visit summary). The office will not call with normal lab results but certainly if there are any significant abnormalities then we will contact you.   Health Maintenance, Female A healthy lifestyle and preventative care can promote health and wellness.  Maintain regular health, dental, and eye exams.  Eat a healthy diet. Foods like vegetables, fruits, whole grains, low-fat dairy products, and lean protein foods contain the nutrients you need without too many calories. Decrease your intake of foods high in solid fats, added sugars, and salt. Get information about a proper diet from your caregiver, if necessary.  Regular physical exercise is one of the most important things you can do for your health. Most adults should get at least 150 minutes of moderate-intensity exercise (any activity that increases your heart rate and causes you to sweat) each week. In addition, most adults need muscle-strengthening exercises on 2 or more days a week.   Maintain a healthy weight. The body mass index (BMI) is a screening tool to identify possible weight problems. It provides an estimate of body fat based on height and weight. Your caregiver can help determine your BMI, and can help you achieve or maintain a healthy weight. For adults 20 years and older:  A BMI below 18.5 is considered underweight.  A BMI of 18.5 to 24.9 is normal.  A BMI of 25 to 29.9 is considered overweight.  A BMI of 30 and above is considered obese.  Maintain normal blood lipids and cholesterol by exercising and minimizing your intake of saturated fat. Eat a balanced diet with plenty of fruits and vegetables. Blood tests for lipids and cholesterol should begin at age 61 and be repeated every 5 years. If your lipid or cholesterol levels are high, you are over 50, or you are a high risk for heart  disease, you may need your cholesterol levels checked more frequently.Ongoing high lipid and cholesterol levels should be treated with medicines if diet and exercise are not effective.  If you smoke, find out from your caregiver how to quit. If you do not use tobacco, do not start.  Lung cancer screening is recommended for adults aged 33 80 years who are at high risk for developing lung cancer because of a history of smoking. Yearly low-dose computed tomography (CT) is recommended for people who have at least a 30-pack-year history of smoking and are a current smoker or have quit within the past 15 years. A pack year of smoking is smoking an average of 1 pack of cigarettes a day for 1 year (for example: 1 pack a day for 30 years or 2 packs a day for 15 years). Yearly screening should continue until the smoker has stopped smoking for at least 15 years. Yearly screening should also be stopped for people who develop a health problem that would prevent them from having lung cancer treatment.  If you are pregnant, do not drink alcohol. If you are breastfeeding, be very cautious about drinking alcohol. If you are not pregnant and choose to drink alcohol, do not exceed 1 drink per day. One drink is considered to be 12 ounces (355 mL) of beer, 5 ounces (148 mL) of wine, or 1.5 ounces (44 mL) of liquor.  Avoid use of street drugs. Do not share needles with anyone. Ask for help if you need support or instructions about stopping  the use of drugs.  High blood pressure causes heart disease and increases the risk of stroke. Blood pressure should be checked at least every 1 to 2 years. Ongoing high blood pressure should be treated with medicines, if weight loss and exercise are not effective.  If you are 59 to 60 years old, ask your caregiver if you should take aspirin to prevent strokes.  Diabetes screening involves taking a blood sample to check your fasting blood sugar level. This should be done once every 3  years, after age 91, if you are within normal weight and without risk factors for diabetes. Testing should be considered at a younger age or be carried out more frequently if you are overweight and have at least 1 risk factor for diabetes.  Breast cancer screening is essential preventative care for women. You should practice "breast self-awareness." This means understanding the normal appearance and feel of your breasts and may include breast self-examination. Any changes detected, no matter how small, should be reported to a caregiver. Women in their 66s and 30s should have a clinical breast exam (CBE) by a caregiver as part of a regular health exam every 1 to 3 years. After age 101, women should have a CBE every year. Starting at age 100, women should consider having a mammogram (breast X-ray) every year. Women who have a family history of breast cancer should talk to their caregiver about genetic screening. Women at a high risk of breast cancer should talk to their caregiver about having an MRI and a mammogram every year.  Breast cancer gene (BRCA)-related cancer risk assessment is recommended for women who have family members with BRCA-related cancers. BRCA-related cancers include breast, ovarian, tubal, and peritoneal cancers. Having family members with these cancers may be associated with an increased risk for harmful changes (mutations) in the breast cancer genes BRCA1 and BRCA2. Results of the assessment will determine the need for genetic counseling and BRCA1 and BRCA2 testing.  The Pap test is a screening test for cervical cancer. Women should have a Pap test starting at age 57. Between ages 25 and 35, Pap tests should be repeated every 2 years. Beginning at age 37, you should have a Pap test every 3 years as long as the past 3 Pap tests have been normal. If you had a hysterectomy for a problem that was not cancer or a condition that could lead to cancer, then you no longer need Pap tests. If you are  between ages 50 and 76, and you have had normal Pap tests going back 10 years, you no longer need Pap tests. If you have had past treatment for cervical cancer or a condition that could lead to cancer, you need Pap tests and screening for cancer for at least 20 years after your treatment. If Pap tests have been discontinued, risk factors (such as a new sexual partner) need to be reassessed to determine if screening should be resumed. Some women have medical problems that increase the chance of getting cervical cancer. In these cases, your caregiver may recommend more frequent screening and Pap tests.  The human papillomavirus (HPV) test is an additional test that may be used for cervical cancer screening. The HPV test looks for the virus that can cause the cell changes on the cervix. The cells collected during the Pap test can be tested for HPV. The HPV test could be used to screen women aged 44 years and older, and should be used in women of any age  who have unclear Pap test results. After the age of 55, women should have HPV testing at the same frequency as a Pap test.  Colorectal cancer can be detected and often prevented. Most routine colorectal cancer screening begins at the age of 44 and continues through age 20. However, your caregiver may recommend screening at an earlier age if you have risk factors for colon cancer. On a yearly basis, your caregiver may provide home test kits to check for hidden blood in the stool. Use of a small camera at the end of a tube, to directly examine the colon (sigmoidoscopy or colonoscopy), can detect the earliest forms of colorectal cancer. Talk to your caregiver about this at age 86, when routine screening begins. Direct examination of the colon should be repeated every 5 to 10 years through age 13, unless early forms of pre-cancerous polyps or small growths are found.  Hepatitis C blood testing is recommended for all people born from [birth year] through 1967 and any  individual with known risks for hepatitis C.  Practice safe sex. Use condoms and avoid high-risk sexual practices to reduce the spread of sexually transmitted infections (STIs). Sexually active women aged 36 and younger should be checked for Chlamydia, which is a common sexually transmitted infection. Older women with new or multiple partners should also be tested for Chlamydia. Testing for other STIs is recommended if you are sexually active and at increased risk.  Osteoporosis is a disease in which the bones lose minerals and strength with aging. This can result in serious bone fractures. The risk of osteoporosis can be identified using a bone density scan. Women ages 20 and over and women at risk for fractures or osteoporosis should discuss screening with their caregivers. Ask your caregiver whether you should be taking a calcium supplement or vitamin D to reduce the rate of osteoporosis.  Menopause can be associated with physical symptoms and risks. Hormone replacement therapy is available to decrease symptoms and risks. You should talk to your caregiver about whether hormone replacement therapy is right for you.  Use sunscreen. Apply sunscreen liberally and repeatedly throughout the day. You should seek shade when your shadow is shorter than you. Protect yourself by wearing long sleeves, pants, a wide-brimmed hat, and sunglasses year round, whenever you are outdoors.  Notify your caregiver of new moles or changes in moles, especially if there is a change in shape or color. Also notify your caregiver if a mole is larger than the size of a pencil eraser.  Stay current with your immunizations. Document Released: 09/23/2010 Document Revised: 07/05/2012 Document Reviewed: 09/23/2010 [facility] Patient Information 2014 [place].

## 2014-01-23 NOTE — Progress Notes (Signed)
Bobetta Jackson-Lorick May 22, 1955 [id number]        58 y.o.  [id number] for annual exam.  Doing well without complaints.  Past medical history,surgical history, problem list, medications, allergies, family history and social history were all reviewed and documented as reviewed in the EPIC chart.  ROS:  12 system ROS performed with pertinent positives and negatives included in the history, assessment and plan.   Additional significant findings :  none   Exam: Kim Counsellor Vitals:   01/23/14 0832  BP: 124/80  Height: 5' 4.5" (1.638 m)  Weight: 197 lb (89.359 kg)   General appearance:  Normal affect, orientation and appearance. Skin: Grossly normal HEENT: Without gross lesions.  No cervical or supraclavicular adenopathy. Thyroid normal.  Lungs:  Clear without wheezing, rales or rhonchi Cardiac: RR, without RMG Abdominal:  Soft, nontender, without masses, guarding, rebound, organomegaly or hernia. Well-healed midline scar. Breasts:  Examined lying and sitting without masses, retractions, discharge or axillary adenopathy. Pelvic:  Ext/BUS/vagina with vitiligo encircling the introital opening from clitoral hood to the perianal area  Adnexa  Without masses or tenderness    Anus and perineum  Normal   Rectovaginal  Normal sphincter tone without palpated masses or tenderness.    Assessment/Plan:  58 y.o. [id number] female for annual exam.   1. Status post TAH for menorrhagia by Dr. Ree Edman. Without significant hot flushes, night sweats, vaginal dryness or dyspareunia. She will continue to monitor. 2. Vitiligo encircling the introital opening. Has remained unchanged. Patient remembers that this is always been present. We'll continue to monitor and report any changes. No symptoms such as itching or irritation. 3. Pap smear 2013. No Pap smear done today. No history of abnormal Pap smears previously.  Options to stop screening altogether she is status post hysterectomy for benign indications  versus screening at a less frequent interval reviewed. Will readdress on an annual basis. 4. Mammography 08/2013. Continue with annual mammography. SBE monthly reviewed. 5. DEXA never. I again discussed screening DEXA. Patient declines as she does not want to do this at this time. Issues of silent nature of osteopenia/osteoporosis reviewed.  She prefers to wait till she is 8. Increase calcium vitamin D reviewed. 6. Colonoscopy 2009. Patient reports recommended repeat interval 10 years. 7. Health maintenance. No routine blood work done as she reports this done at her primary physician's office. Follow up 1 year, sooner as needed.     Anastasio Auerbach MD, 8:53 AM 01/23/2014

## 2014-01-24 LAB — URINALYSIS W MICROSCOPIC + REFLEX CULTURE
Bilirubin Urine: NEGATIVE
Casts: NONE SEEN
Glucose, UA: NEGATIVE mg/dL
Hgb urine dipstick: NEGATIVE
Ketones, ur: NEGATIVE mg/dL
Leukocytes, UA: NEGATIVE
Nitrite: NEGATIVE
Protein, ur: NEGATIVE mg/dL
Specific Gravity, Urine: 1.017 (ref 1.005–1.030)
Squamous Epithelial / LPF: NONE SEEN
Urobilinogen, UA: 0.2 mg/dL (ref 0.0–1.0)
pH: 5.5 (ref 5.0–8.0)

## 2014-01-26 ENCOUNTER — Other Ambulatory Visit: Payer: Self-pay | Admitting: *Deleted

## 2014-01-26 LAB — URINE CULTURE: Colony Count: >=100000

## 2014-01-26 MED ORDER — CIPROFLOXACIN HCL 250 MG PO TABS: 250.0000 mg | ORAL_TABLET | Freq: Two times a day (BID) | ORAL | Status: DC

## 2014-04-25 ENCOUNTER — Emergency Department (HOSPITAL_COMMUNITY): Payer: 59

## 2014-04-25 ENCOUNTER — Emergency Department (HOSPITAL_COMMUNITY)
Admission: EM | Admit: 2014-04-25 | Discharge: 2014-04-25 | Disposition: A | Discharge status: Home / Self Care | Payer: 59 | Attending: Emergency Medicine | Admitting: Emergency Medicine

## 2014-04-25 ENCOUNTER — Encounter (HOSPITAL_COMMUNITY): Payer: Self-pay | Admitting: Emergency Medicine

## 2014-04-25 DIAGNOSIS — Z8719 Personal history of other diseases of the digestive system: Secondary | ICD-10-CM | POA: Diagnosis not present

## 2014-04-25 DIAGNOSIS — M25571 Pain in right ankle and joints of right foot: Secondary | ICD-10-CM | POA: Diagnosis present

## 2014-04-25 DIAGNOSIS — I10 Essential (primary) hypertension: Secondary | ICD-10-CM | POA: Diagnosis not present

## 2014-04-25 DIAGNOSIS — Z87891 Personal history of nicotine dependence: Secondary | ICD-10-CM | POA: Diagnosis not present

## 2014-04-25 DIAGNOSIS — Z8639 Personal history of other endocrine, nutritional and metabolic disease: Secondary | ICD-10-CM | POA: Insufficient documentation

## 2014-04-25 DIAGNOSIS — Z792 Long term (current) use of antibiotics: Secondary | ICD-10-CM | POA: Diagnosis not present

## 2014-04-25 DIAGNOSIS — Z8659 Personal history of other mental and behavioral disorders: Secondary | ICD-10-CM | POA: Diagnosis not present

## 2014-04-25 MED ORDER — HYDROCODONE-ACETAMINOPHEN 5-325 MG PO TABS: 1.0000 | ORAL_TABLET | Freq: Four times a day (QID) | ORAL | Status: DC | PRN

## 2014-04-25 NOTE — Discharge Instructions (Signed)
Arthralgia °Your caregiver has diagnosed you as suffering from an arthralgia. Arthralgia means there is pain in a joint. This can come from many reasons including: °· Bruising the joint which causes soreness (inflammation) in the joint. °· Wear and tear on the joints which occur as we grow older (osteoarthritis). °· Overusing the joint. °· Various forms of arthritis. °· Infections of the joint. °Regardless of the cause of pain in your joint, most of these different pains respond to anti-inflammatory drugs and rest. The exception to this is when a joint is infected, and these cases are treated with antibiotics, if it is a bacterial infection. °HOME CARE INSTRUCTIONS  °· Rest the injured area for as long as directed by your caregiver. Then slowly start using the joint as directed by your caregiver and as the pain allows. Crutches as directed may be useful if the ankles, knees or hips are involved. If the knee was splinted or casted, continue use and care as directed. If an stretchy or elastic wrapping bandage has been applied today, it should be removed and re-applied every 3 to 4 hours. It should not be applied tightly, but firmly enough to keep swelling down. Watch toes and feet for swelling, bluish discoloration, coldness, numbness or excessive pain. If any of these problems (symptoms) occur, remove the ace bandage and re-apply more loosely. If these symptoms persist, contact your caregiver or return to this location. °· For the first 24 hours, keep the injured extremity elevated on pillows while lying down. °· Apply ice for 15-20 minutes to the sore joint every couple hours while awake for the first half day. Then 03-04 times per day for the first 48 hours. Put the ice in a plastic bag and place a towel between the bag of ice and your skin. °· Wear any splinting, casting, elastic bandage applications, or slings as instructed. °· Only take over-the-counter or prescription medicines for pain, discomfort, or fever as  directed by your caregiver. Do not use aspirin immediately after the injury unless instructed by your physician. Aspirin can cause increased bleeding and bruising of the tissues. °· If you were given crutches, continue to use them as instructed and do not resume weight bearing on the sore joint until instructed. °Persistent pain and inability to use the sore joint as directed for more than 2 to 3 days are warning signs indicating that you should see a caregiver for a follow-up visit as soon as possible. Initially, a hairline fracture (break in bone) may not be evident on X-rays. Persistent pain and swelling indicate that further evaluation, non-weight bearing or use of the joint (use of crutches or slings as instructed), or further X-rays are indicated. X-rays may sometimes not show a small fracture until a week or 10 days later. Make a follow-up appointment with your own caregiver or one to whom we have referred you. A radiologist (specialist in reading X-rays) may read your X-rays. Make sure you know how you are to obtain your X-ray results. Do not assume everything is normal if you do not hear from us. °SEEK MEDICAL CARE IF: °Bruising, swelling, or pain increases. °SEEK IMMEDIATE MEDICAL CARE IF:  °· Your fingers or toes are numb or blue. °· The pain is not responding to medications and continues to stay the same or get worse. °· The pain in your joint becomes severe. °· You develop a fever over 102° F (38.9° C). °· It becomes impossible to move or use the joint. °MAKE SURE YOU:  °·   Understand these instructions.  Will watch your condition.  Will get help right away if you are not doing well or get worse. Document Released: 03/10/2005 Document Revised: 06/02/2011 Document Reviewed: 10/27/2007 [facility] Patient Information 2015 [place], [place]. This information is not intended to replace advice given to you by your health care provider. Make sure you discuss any questions you have with your health care  provider.  Ankle Pain Ankle pain is a common symptom. The bones, cartilage, tendons, and muscles of the ankle joint perform a lot of work each day. The ankle joint holds your body weight and allows you to move around. Ankle pain can occur on either side or back of 1 or both ankles. Ankle pain may be sharp and burning or dull and aching. There may be tenderness, stiffness, redness, or warmth around the ankle. The pain occurs more often when a person walks or puts pressure on the ankle. CAUSES  There are many reasons ankle pain can develop. It is important to work with your caregiver to identify the cause since many conditions can impact the bones, cartilage, muscles, and tendons. Causes for ankle pain include:  Injury, including a break (fracture), sprain, or strain often due to a fall, sports, or a high-impact activity.  Swelling (inflammation) of a tendon (tendonitis).  Achilles tendon rupture.  Ankle instability after repeated sprains and strains.  Poor foot alignment.  Pressure on a nerve (tarsal tunnel syndrome).  Arthritis in the ankle or the lining of the ankle.  Crystal formation in the ankle (gout or pseudogout). DIAGNOSIS  A diagnosis is based on your medical history, your symptoms, results of your physical exam, and results of diagnostic tests. Diagnostic tests may include X-ray exams or a computerized magnetic scan (magnetic resonance imaging, MRI). TREATMENT  Treatment will depend on the cause of your ankle pain and may include:  Keeping pressure off the ankle and limiting activities.  Using crutches or other walking support (a cane or brace).  Using rest, ice, compression, and elevation.  Participating in physical therapy or home exercises.  Wearing shoe inserts or special shoes.  Losing weight.  Taking medications to reduce pain or swelling or receiving an injection.  Undergoing surgery. HOME CARE INSTRUCTIONS   Only take over-the-counter or prescription  medicines for pain, discomfort, or fever as directed by your caregiver.  Put ice on the injured area.  Put ice in a plastic bag.  Place a towel between your skin and the bag.  Leave the ice on for 15-20 minutes at a time, 03-04 times a day.  Keep your leg raised (elevated) when possible to lessen swelling.  Avoid activities that cause ankle pain.  Follow specific exercises as directed by your caregiver.  Record how often you have ankle pain, the location of the pain, and what it feels like. This information may be helpful to you and your caregiver.  Ask your caregiver about returning to work or sports and whether you should drive.  Follow up with your caregiver for further examination, therapy, or testing as directed. SEEK MEDICAL CARE IF:   Pain or swelling continues or worsens beyond 1 week.  You have an oral temperature above 102 F (38.9 C).  You are feeling unwell or have chills.  You are having an increasingly difficult time with walking.  You have loss of sensation or other new symptoms.  You have questions or concerns. MAKE SURE YOU:   Understand these instructions.  Will watch your condition.  Will get help  right away if you are not doing well or get worse. Document Released: 08/28/2009 Document Revised: 06/02/2011 Document Reviewed: 08/28/2009 [facility] Patient Information 2015 [place], [place]. This information is not intended to replace advice given to you by your health care provider. Make sure you discuss any questions you have with your health care provider.

## 2014-04-25 NOTE — ED Notes (Signed)
Pt. reports right lower leg pain onset this morning worse when walking or standing , denies injury / no swelling .

## 2014-04-25 NOTE — ED Provider Notes (Signed)
CSN: [id number]     Arrival date & time 04/25/14  2114 History  This chart was scribed for non-physician practitioner, Montine Circle, PA-C working with Carmin Muskrat, MD by Einar Pheasant, ED scribe. This patient was seen in room TR10C/TR10C and the patient's care was started at 10:01 PM.   Chief Complaint  Patient presents with  . Leg Pain   The history is provided by the patient and medical records. No language interpreter was used.   HPI Comments: Vanessa Price is a 59 y.o. female who presents to the Emergency Department with chief complaint of lower right leg pain that started this AM after she finished exercising on the [employer]. She states that the pain is exacerbated by walking and going up the stair, however, the pain is made better by sitting with her foot flat on the floor. Pt states that the pain is localized more to her right foot. No knee pain or calf pain. Denies any known injuries or falls. Pt denies any fever, neck pain, sore throat, visual disturbance, CP, cough, SOB, abdominal pain, nausea, emesis, diarrhea, urinary symptoms, back pain, HA, weakness, numbness and rash as associated symptoms.     Past Medical History  Diagnosis Date  . ANXIETY 06/04/2009  . GERD 08/14/2008  . HYPERLIPIDEMIA 09/14/2006  . HYPERTENSION 09/14/2006  . MYOCARDIAL PERFUSION SCAN, WITH STRESS TEST, ABNORMAL 10/19/2008  . PREMATURE VENTRICULAR CONTRACTIONS 08/14/2008  . Impaired glucose tolerance    Past Surgical History  Procedure Laterality Date  . Cesarean section    . Abdominal hysterectomy  1999    menorrhagia   Family History  Problem Relation Age of Onset  . Arthritis Mother   . Diabetes Mother   . Heart disease Mother   . Mental illness Father    History  Substance Use Topics  . Smoking status: Former Smoker    Quit date: 03/24/1993  . Smokeless tobacco: Never Used  . Alcohol Use: 0.5 oz/week    1 Not specified per week     Comment: rare   OB History    Gravida  Para Term Preterm AB TAB SAB Ectopic Multiple Living   5 2 2  3     2      Review of Systems  Constitutional: Negative for fever.  Musculoskeletal: Positive for arthralgias. Negative for myalgias and joint swelling.  Neurological: Negative for weakness and numbness.   Allergies  Lisinopril and Risperidone  Home Medications   Prior to Admission medications   Medication Sig Start Date End Date Taking? Authorizing Provider  ciprofloxacin (CIPRO) 250 MG tablet Take 1 tablet (250 mg total) by mouth 2 (two) times daily. 01/26/14   Anastasio Auerbach, MD  Naproxen Sodium (ALEVE PO) Take by mouth.    Historical Provider, MD   BP 156/72 mmHg  Pulse 79  Temp(Src) 97.7 F (36.5 C) (Oral)  Resp 18  Ht 5\' 4"  (1.626 m)  Wt 187 lb (84.823 kg)  BMI 32.08 kg/m2  SpO2 94%  Physical Exam  Constitutional: She is oriented to person, place, and time. She appears well-developed and well-nourished. No distress.  HENT:  Head: Normocephalic and atraumatic.  Eyes: Conjunctivae and EOM are normal.  Neck: Neck supple. No tracheal deviation present.  Cardiovascular: Normal rate, regular rhythm and intact distal pulses.   Intact distal pulses with brisk capillary refill.   Pulmonary/Chest: Effort normal. No respiratory distress.  Musculoskeletal: Normal range of motion.  Right ankle: nontender to palpation. No bony abnormality or deform. ROM and  strength 5/5.  Antalgic gait.  No calf tenderness. No signs of DVT.  Neurological: She is alert and oriented to person, place, and time.  Sensation intact.   Skin: Skin is warm and dry.  No erythema. No sign of cellulitis, abscess, gout, or septic joint.   Psychiatric: She has a normal mood and affect. Her behavior is normal.  Nursing note and vitals reviewed.   ED Course  Procedures (including critical care time)  DIAGNOSTIC STUDIES: Oxygen Saturation is 94% on RA, low by my interpretation.    COORDINATION OF CARE: 10:06 PM- will order X-ray of right  foot. Pt advised of plan for treatment and pt agrees.   Imaging Review Dg Ankle Complete Right  04/25/2014   CLINICAL DATA:  Acute onset of right lateral ankle pain for 1 day. Initial encounter.  EXAM: RIGHT ANKLE - COMPLETE 3+ VIEW  COMPARISON:  None.  FINDINGS: There is no evidence of fracture or dislocation. A tiny osseous fragment distal to the distal fibula may reflect remote injury. The ankle mortise is intact; the interosseous space is within normal limits. No talar tilt or subluxation is seen. The osteophyte is noted arising at the medial malleolus. Large posterior and small to moderate plantar calcaneal spurs are seen. Two os peroneum fragments are noted.  The joint spaces are preserved. No significant soft tissue abnormalities are seen.  IMPRESSION: 1. No evidence of fracture or dislocation. 2. Tiny osseous fragment distal to the distal fibula may reflect remote injury. 3. Two os peroneum fragments noted.   Electronically Signed   By: Garald Balding M.D.   On: 04/25/2014 22:29      MDM   Final diagnoses:  Ankle pain, right    Patient with right ankle pain.  Possible remote injury on plain film.  Will treat with ankle ASO and pain meds.  Recommend ortho follow-up.  No evidence of infection.  I personally performed the services described in this documentation, which was scribed in my presence. The recorded information has been reviewed and is accurate.    Montine Circle, PA-C 04/25/14 2254  Carmin Muskrat, MD 04/25/14 [phone]

## 2014-04-25 NOTE — ED Notes (Signed)
Pt wheeled out to front entrance.

## 2014-04-25 NOTE — ED Notes (Signed)
Patient transported to X-ray 

## 2014-05-24 ENCOUNTER — Encounter: Payer: Self-pay | Admitting: Gynecology

## 2014-06-01 ENCOUNTER — Encounter: Payer: Self-pay | Admitting: Gynecology

## 2014-08-04 ENCOUNTER — Encounter: Payer: 59 | Admitting: Internal Medicine

## 2014-08-10 ENCOUNTER — Other Ambulatory Visit (INDEPENDENT_AMBULATORY_CARE_PROVIDER_SITE_OTHER): Payer: 59

## 2014-08-10 DIAGNOSIS — Z0000 Encounter for general adult medical examination without abnormal findings: Secondary | ICD-10-CM

## 2014-08-10 DIAGNOSIS — R7989 Other specified abnormal findings of blood chemistry: Secondary | ICD-10-CM

## 2014-08-10 LAB — POCT URINALYSIS DIPSTICK
Bilirubin, UA: NEGATIVE
Blood, UA: NEGATIVE
Glucose, UA: NEGATIVE
Ketones, UA: NEGATIVE
Leukocytes, UA: NEGATIVE
Nitrite, UA: NEGATIVE
Spec Grav, UA: 1.025
Urobilinogen, UA: 1
pH, UA: 6.5

## 2014-08-10 LAB — BASIC METABOLIC PANEL
BUN: 13 mg/dL (ref 6–23)
CO2: 28 mEq/L (ref 19–32)
Calcium: 9.7 mg/dL (ref 8.4–10.5)
Chloride: 105 mEq/L (ref 96–112)
Creatinine, Ser: 0.73 mg/dL (ref 0.40–1.20)
GFR: 104.31 mL/min (ref >60.00)
Glucose, Bld: 112 mg/dL — ABNORMAL HIGH (ref 70–99)
Potassium: 3.5 mEq/L (ref 3.5–5.1)
Sodium: 141 mEq/L (ref 135–145)

## 2014-08-10 LAB — LIPID PANEL
Cholesterol: 290 mg/dL — ABNORMAL HIGH (ref 0–200)
HDL: 36.9 mg/dL — ABNORMAL LOW (ref >39.00)
NonHDL: 253.1
Total CHOL/HDL Ratio: 8
Triglycerides: 336 mg/dL — ABNORMAL HIGH (ref 0.0–149.0)
VLDL: 67.2 mg/dL — ABNORMAL HIGH (ref 0.0–40.0)

## 2014-08-10 LAB — CBC WITH DIFFERENTIAL/PLATELET
Basophils Absolute: 0 10*3/uL (ref 0.0–0.1)
Basophils Relative: 0.7 % (ref 0.0–3.0)
Eosinophils Absolute: 0.1 10*3/uL (ref 0.0–0.7)
Eosinophils Relative: 2.5 % (ref 0.0–5.0)
HCT: 41 % (ref 36.0–46.0)
Hemoglobin: 14.1 g/dL (ref 12.0–15.0)
Lymphocytes Relative: 40.2 % (ref 12.0–46.0)
Lymphs Abs: 1.7 10*3/uL (ref 0.7–4.0)
MCHC: 34.4 g/dL (ref 30.0–36.0)
MCV: 81.3 fl (ref 78.0–100.0)
Monocytes Absolute: 0.2 10*3/uL (ref 0.1–1.0)
Monocytes Relative: 5.3 % (ref 3.0–12.0)
Neutro Abs: 2.2 10*3/uL (ref 1.4–7.7)
Neutrophils Relative %: 51.3 % (ref 43.0–77.0)
Platelets: 220 10*3/uL (ref 150.0–400.0)
RBC: 5.04 Mil/uL (ref 3.87–5.11)
RDW: 15 % (ref 11.5–15.5)
WBC: 4.3 10*3/uL (ref 4.0–10.5)

## 2014-08-10 LAB — HEPATIC FUNCTION PANEL
ALT: 17 U/L (ref 0–35)
AST: 15 U/L (ref 0–37)
Albumin: 4.3 g/dL (ref 3.5–5.2)
Alkaline Phosphatase: 80 U/L (ref 39–117)
Bilirubin, Direct: 0.1 mg/dL (ref 0.0–0.3)
Total Bilirubin: 0.4 mg/dL (ref 0.2–1.2)
Total Protein: 7.2 g/dL (ref 6.0–8.3)

## 2014-08-10 LAB — LDL CHOLESTEROL, DIRECT: Direct LDL: 183 mg/dL

## 2014-08-10 LAB — TSH: TSH: 3.3 u[IU]/mL (ref 0.35–4.50)

## 2014-08-11 LAB — HIV ANTIBODY (ROUTINE TESTING W REFLEX): HIV 1&2 Ab, 4th Generation: NONREACTIVE

## 2014-08-17 ENCOUNTER — Encounter: Payer: Self-pay | Admitting: Internal Medicine

## 2014-08-17 ENCOUNTER — Ambulatory Visit (INDEPENDENT_AMBULATORY_CARE_PROVIDER_SITE_OTHER): Payer: 59 | Admitting: Internal Medicine

## 2014-08-17 VITALS — BP 110/70 | HR 68 | Temp 98.1°F | Resp 20 | Ht 64.25 in | Wt 200.0 lb

## 2014-08-17 DIAGNOSIS — Z23 Encounter for immunization: Secondary | ICD-10-CM | POA: Diagnosis not present

## 2014-08-17 DIAGNOSIS — E049 Nontoxic goiter, unspecified: Secondary | ICD-10-CM

## 2014-08-17 DIAGNOSIS — E785 Hyperlipidemia, unspecified: Secondary | ICD-10-CM

## 2014-08-17 DIAGNOSIS — Z0000 Encounter for general adult medical examination without abnormal findings: Secondary | ICD-10-CM

## 2014-08-17 DIAGNOSIS — I10 Essential (primary) hypertension: Secondary | ICD-10-CM | POA: Diagnosis not present

## 2014-08-17 NOTE — Patient Instructions (Signed)
Limit your sodium (Salt) intake    It is important that you exercise regularly, at least 20 minutes 3 to 4 times per week.  If you develop chest pain or shortness of breath seek  medical attention.  You need to lose weight.  Consider a lower calorie diet and regular exercise.  Fat and Cholesterol Control Diet Fat and cholesterol levels in your blood and organs are influenced by your diet. High levels of fat and cholesterol may lead to diseases of the heart, small and large blood vessels, gallbladder, liver, and pancreas. CONTROLLING FAT AND CHOLESTEROL WITH DIET Although exercise and lifestyle factors are important, your diet is key. That is because certain foods are known to raise cholesterol and others to lower it. The goal is to balance foods for their effect on cholesterol and more importantly, to replace saturated and trans fat with other types of fat, such as monounsaturated fat, polyunsaturated fat, and omega-3 fatty acids. On average, a person should consume no more than 15 to 17 g of saturated fat daily. Saturated and trans fats are considered "bad" fats, and they will raise LDL cholesterol. Saturated fats are primarily found in animal products such as meats, butter, and cream. However, that does not mean you need to give up all your favorite foods. Today, there are good tasting, low-fat, low-cholesterol substitutes for most of the things you like to eat. Choose low-fat or nonfat alternatives. Choose round or loin cuts of red meat. These types of cuts are lowest in fat and cholesterol. Chicken (without the skin), fish, veal, and ground [place] breast are great choices. Eliminate fatty meats, such as hot dogs and salami. Even shellfish have little or no saturated fat. Have a 3 oz (85 g) portion when you eat lean meat, poultry, or fish. Trans fats are also called "partially hydrogenated oils." They are oils that have been scientifically manipulated so that they are solid at room temperature  resulting in a longer shelf life and improved taste and texture of foods in which they are added. Trans fats are found in stick margarine, some tub margarines, cookies, crackers, and baked goods.  When baking and cooking, oils are a great substitute for butter. The monounsaturated oils are especially beneficial since it is believed they lower LDL and raise HDL. The oils you should avoid entirely are saturated tropical oils, such as coconut and palm.  Remember to eat a lot from food groups that are naturally free of saturated and trans fat, including fish, fruit, vegetables, beans, grains (barley, rice, couscous, bulgur wheat), and pasta (without cream sauces).  IDENTIFYING FOODS THAT LOWER FAT AND CHOLESTEROL  Soluble fiber may lower your cholesterol. This type of fiber is found in fruits such as apples, vegetables such as broccoli, potatoes, and carrots, legumes such as beans, peas, and lentils, and grains such as barley. Foods fortified with plant sterols (phytosterol) may also lower cholesterol. You should eat at least 2 g per day of these foods for a cholesterol lowering effect.  Read package labels to identify low-saturated fats, trans fat free, and low-fat foods at the supermarket. Select cheeses that have only 2 to 3 g saturated fat per ounce. Use a heart-healthy tub margarine that is free of trans fats or partially hydrogenated oil. When buying baked goods (cookies, crackers), avoid partially hydrogenated oils. Breads and muffins should be made from whole grains (whole-wheat or whole oat flour, instead of "flour" or "enriched flour"). Buy non-creamy canned soups with reduced salt and no added fats.  FOOD PREPARATION TECHNIQUES  Never deep-fry. If you must fry, either stir-fry, which uses very little fat, or use non-stick cooking sprays. When possible, broil, bake, or roast meats, and steam vegetables. Instead of putting butter or margarine on vegetables, use lemon and herbs, applesauce, and cinnamon  (for squash and sweet potatoes). Use nonfat yogurt, salsa, and low-fat dressings for salads.  LOW-SATURATED FAT / LOW-FAT FOOD SUBSTITUTES Meats / Saturated Fat (g)  Avoid: Steak, marbled (3 oz/85 g) / 11 g  Choose: Steak, lean (3 oz/85 g) / 4 g  Avoid: Hamburger (3 oz/85 g) / 7 g  Choose: Hamburger, lean (3 oz/85 g) / 5 g  Avoid: Ham (3 oz/85 g) / 6 g  Choose: Ham, lean cut (3 oz/85 g) / 2.4 g  Avoid: Chicken, with skin, dark meat (3 oz/85 g) / 4 g  Choose: Chicken, skin removed, dark meat (3 oz/85 g) / 2 g  Avoid: Chicken, with skin, light meat (3 oz/85 g) / 2.5 g  Choose: Chicken, skin removed, light meat (3 oz/85 g) / 1 g Dairy / Saturated Fat (g)  Avoid: Whole milk (1 cup) / 5 g  Choose: Low-fat milk, 2% (1 cup) / 3 g  Choose: Low-fat milk, 1% (1 cup) / 1.5 g  Choose: Skim milk (1 cup) / 0.3 g  Avoid: Hard cheese (1 oz/28 g) / 6 g  Choose: Skim milk cheese (1 oz/28 g) / 2 to 3 g  Avoid: Cottage cheese, 4% fat (1 cup) / 6.5 g  Choose: Low-fat cottage cheese, 1% fat (1 cup) / 1.5 g  Avoid: Ice cream (1 cup) / 9 g  Choose: Sherbet (1 cup) / 2.5 g  Choose: Nonfat frozen yogurt (1 cup) / 0.3 g  Choose: Frozen fruit bar / trace  Avoid: Whipped cream (1 tbs) / 3.5 g  Choose: Nondairy whipped topping (1 tbs) / 1 g Condiments / Saturated Fat (g)  Avoid: Mayonnaise (1 tbs) / 2 g  Choose: Low-fat mayonnaise (1 tbs) / 1 g  Avoid: Butter (1 tbs) / 7 g  Choose: Extra light margarine (1 tbs) / 1 g  Avoid: Coconut oil (1 tbs) / 11.8 g  Choose: Olive oil (1 tbs) / 1.8 g  Choose: Corn oil (1 tbs) / 1.7 g  Choose: Safflower oil (1 tbs) / 1.2 g  Choose: Sunflower oil (1 tbs) / 1.4 g  Choose: Soybean oil (1 tbs) / 2.4 g  Choose: Canola oil (1 tbs) / 1 g Document Released: 03/10/2005 Document Revised: 07/05/2012 Document Reviewed: 06/08/2013 [employer] Patient Information 2015 [place], [place]. This information is not intended to replace advice given to you by  your health care provider. Make sure you discuss any questions you have with your health care provider. Health Maintenance Adopting a healthy lifestyle and getting preventive care can go a long way to promote health and wellness. Talk with your health care provider about what schedule of regular examinations is right for you. This is a good chance for you to check in with your provider about disease prevention and staying healthy. In between checkups, there are plenty of things you can do on your own. Experts have done a lot of research about which lifestyle changes and preventive measures are most likely to keep you healthy. Ask your health care provider for more information. WEIGHT AND DIET  Eat a healthy diet  Be sure to include plenty of vegetables, fruits, low-fat dairy products, and lean protein.  Do not eat  a lot of foods high in solid fats, added sugars, or salt.  Get regular exercise. This is one of the most important things you can do for your health.  Most adults should exercise for at least 150 minutes each week. The exercise should increase your heart rate and make you sweat (moderate-intensity exercise).  Most adults should also do strengthening exercises at least twice a week. This is in addition to the moderate-intensity exercise.  Maintain a healthy weight  Body mass index (BMI) is a measurement that can be used to identify possible weight problems. It estimates body fat based on height and weight. Your health care provider can help determine your BMI and help you achieve or maintain a healthy weight.  For females 22 years of age and older:   A BMI below 18.5 is considered underweight.  A BMI of 18.5 to 24.9 is normal.  A BMI of 25 to 29.9 is considered overweight.  A BMI of 30 and above is considered obese.  Watch levels of cholesterol and blood lipids  You should start having your blood tested for lipids and cholesterol at 61 years of age, then have this test  every 5 years.  You may need to have your cholesterol levels checked more often if:  Your lipid or cholesterol levels are high.  You are older than 61 years of age.  You are at high risk for heart disease.  CANCER SCREENING   Lung Cancer  Lung cancer screening is recommended for adults 10-61 years old who are at high risk for lung cancer because of a history of smoking.  A yearly low-dose CT scan of the lungs is recommended for people who:  Currently smoke.  Have quit within the past 15 years.  Have at least a 30-pack-year history of smoking. A pack year is smoking an average of one pack of cigarettes a day for 1 year.  Yearly screening should continue until it has been 15 years since you quit.  Yearly screening should stop if you develop a health problem that would prevent you from having lung cancer treatment.  Breast Cancer  Practice breast self-awareness. This means understanding how your breasts normally appear and feel.  It also means doing regular breast self-exams. Let your health care provider know about any changes, no matter how small.  If you are in your 20s or 30s, you should have a clinical breast exam (CBE) by a health care provider every 1-3 years as part of a regular health exam.  If you are 30 or older, have a CBE every year. Also consider having a breast X-ray (mammogram) every year.  If you have a family history of breast cancer, talk to your health care provider about genetic screening.  If you are at high risk for breast cancer, talk to your health care provider about having an MRI and a mammogram every year.  Breast cancer gene (BRCA) assessment is recommended for women who have family members with BRCA-related cancers. BRCA-related cancers include:  Breast.  Ovarian.  Tubal.  Peritoneal cancers.  Results of the assessment will determine the need for genetic counseling and BRCA1 and BRCA2 testing. Cervical Cancer Routine pelvic  examinations to screen for cervical cancer are no longer recommended for nonpregnant women who are considered low risk for cancer of the pelvic organs (ovaries, uterus, and vagina) and who do not have symptoms. A pelvic examination may be necessary if you have symptoms including those associated with pelvic infections. Ask your health  care provider if a screening pelvic exam is right for you.   The Pap test is the screening test for cervical cancer for women who are considered at risk.  If you had a hysterectomy for a problem that was not cancer or a condition that could lead to cancer, then you no longer need Pap tests.  If you are older than 65 years, and you have had normal Pap tests for the past 10 years, you no longer need to have Pap tests.  If you have had past treatment for cervical cancer or a condition that could lead to cancer, you need Pap tests and screening for cancer for at least 20 years after your treatment.  If you no longer get a Pap test, assess your risk factors if they change (such as having a new sexual partner). This can affect whether you should start being screened again.  Some women have medical problems that increase their chance of getting cervical cancer. If this is the case for you, your health care provider may recommend more frequent screening and Pap tests.  The human papillomavirus (HPV) test is another test that may be used for cervical cancer screening. The HPV test looks for the virus that can cause cell changes in the cervix. The cells collected during the Pap test can be tested for HPV.  The HPV test can be used to screen women 11 years of age and older. Getting tested for HPV can extend the interval between normal Pap tests from three to five years.  An HPV test also should be used to screen women of any age who have unclear Pap test results.  After 61 years of age, women should have HPV testing as often as Pap tests.  Colorectal Cancer  This type of  cancer can be detected and often prevented.  Routine colorectal cancer screening usually begins at 61 years of age and continues through 61 years of age.  Your health care provider may recommend screening at an earlier age if you have risk factors for colon cancer.  Your health care provider may also recommend using home test kits to check for hidden blood in the stool.  A small camera at the end of a tube can be used to examine your colon directly (sigmoidoscopy or colonoscopy). This is done to check for the earliest forms of colorectal cancer.  Routine screening usually begins at age 97.  Direct examination of the colon should be repeated every 5-10 years through 61 years of age. However, you may need to be screened more often if early forms of precancerous polyps or small growths are found. Skin Cancer  Check your skin from head to toe regularly.  Tell your health care provider about any new moles or changes in moles, especially if there is a change in a mole's shape or color.  Also tell your health care provider if you have a mole that is larger than the size of a pencil eraser.  Always use sunscreen. Apply sunscreen liberally and repeatedly throughout the day.  Protect yourself by wearing long sleeves, pants, a wide-brimmed hat, and sunglasses whenever you are outside. HEART DISEASE, DIABETES, AND HIGH BLOOD PRESSURE   Have your blood pressure checked at least every 1-2 years. High blood pressure causes heart disease and increases the risk of stroke.  If you are between 43 years and 73 years old, ask your health care provider if you should take aspirin to prevent strokes.  Have regular diabetes screenings. This  involves taking a blood sample to check your fasting blood sugar level.  If you are at a normal weight and have a low risk for diabetes, have this test once every three years after 61 years of age.  If you are overweight and have a high risk for diabetes, consider being  tested at a younger age or more often. PREVENTING INFECTION  Hepatitis B  If you have a higher risk for hepatitis B, you should be screened for this virus. You are considered at high risk for hepatitis B if:  You were born in a country where hepatitis B is common. Ask your health care provider which countries are considered high risk.  Your parents were born in a high-risk country, and you have not been immunized against hepatitis B (hepatitis B vaccine).  You have HIV or AIDS.  You use needles to inject street drugs.  You live with someone who has hepatitis B.  You have had sex with someone who has hepatitis B.  You get hemodialysis treatment.  You take certain medicines for conditions, including cancer, organ transplantation, and autoimmune conditions. Hepatitis C  Blood testing is recommended for:  Everyone born from [birth year] through 1967.  Anyone with known risk factors for hepatitis C. Sexually transmitted infections (STIs)  You should be screened for sexually transmitted infections (STIs) including gonorrhea and chlamydia if:  You are sexually active and are younger than 61 years of age.  You are older than 61 years of age and your health care provider tells you that you are at risk for this type of infection.  Your sexual activity has changed since you were last screened and you are at an increased risk for chlamydia or gonorrhea. Ask your health care provider if you are at risk.  If you do not have HIV, but are at risk, it may be recommended that you take a prescription medicine daily to prevent HIV infection. This is called pre-exposure prophylaxis (PrEP). You are considered at risk if:  You are sexually active and do not regularly use condoms or know the HIV status of your partner(s).  You take drugs by injection.  You are sexually active with a partner who has HIV. Talk with your health care provider about whether you are at high risk of being infected with HIV. If  you choose to begin PrEP, you should first be tested for HIV. You should then be tested every 3 months for as long as you are taking PrEP.  PREGNANCY   If you are premenopausal and you may become pregnant, ask your health care provider about preconception counseling.  If you may become pregnant, take 400 to 800 micrograms (mcg) of folic acid every day.  If you want to prevent pregnancy, talk to your health care provider about birth control (contraception). OSTEOPOROSIS AND MENOPAUSE   Osteoporosis is a disease in which the bones lose minerals and strength with aging. This can result in serious bone fractures. Your risk for osteoporosis can be identified using a bone density scan.  If you are 80 years of age or older, or if you are at risk for osteoporosis and fractures, ask your health care provider if you should be screened.  Ask your health care provider whether you should take a calcium or vitamin D supplement to lower your risk for osteoporosis.  Menopause may have certain physical symptoms and risks.  Hormone replacement therapy may reduce some of these symptoms and risks. Talk to your health care provider about  whether hormone replacement therapy is right for you.  HOME CARE INSTRUCTIONS   Schedule regular health, dental, and eye exams.  Stay current with your immunizations.   Do not use any tobacco products including cigarettes, chewing tobacco, or electronic cigarettes.  If you are pregnant, do not drink alcohol.  If you are breastfeeding, limit how much and how often you drink alcohol.  Limit alcohol intake to no more than 1 drink per day for nonpregnant women. One drink equals 12 ounces of beer, 5 ounces of wine, or 1 ounces of hard liquor.  Do not use street drugs.  Do not share needles.  Ask your health care provider for help if you need support or information about quitting drugs.  Tell your health care provider if you often feel depressed.  Tell your health  care provider if you have ever been abused or do not feel safe at home. Document Released: 09/23/2010 Document Revised: 07/25/2013 Document Reviewed: 02/09/2013 [facility] Patient Information 2015 [place], [place]. This information is not intended to replace advice given to you by your health care provider. Make sure you discuss any questions you have with your health care provider.

## 2014-08-17 NOTE — Progress Notes (Signed)
Subjective:    Patient ID: Vanessa Price, female    DOB: Mar 24, 1956, 59 y.o.   MRN: [id number]  HPI  Pre-visit discussion using our clinic review tool. No additional management support is needed unless otherwise documented below in the visit note.  59 year-old patient who is seen today for a wellness exam.  She has a history of hypertension but presently managed off medication. She does monitor home blood pressure readings. She has been seen recently by gynecology. She has a history of dyslipidemia and has been treated with atorvastatin in the past she is fearful of statin side effects. She does have a history of exogenous obesity.  Past Medical History  Diagnosis Date  . ANXIETY 06/04/2009  . GERD 08/14/2008  . HYPERLIPIDEMIA 09/14/2006  . HYPERTENSION 09/14/2006  . MYOCARDIAL PERFUSION SCAN, WITH STRESS TEST, ABNORMAL 10/19/2008  . PREMATURE VENTRICULAR CONTRACTIONS 08/14/2008  . Impaired glucose tolerance     History   Social History  . Marital Status: Married    Spouse Name: N/A  . Number of Children: N/A  . Years of Education: N/A   Occupational History  . Not on file.   Social History Main Topics  . Smoking status: Former Smoker    Quit date: 03/24/1993  . Smokeless tobacco: Never Used  . Alcohol Use: 0.5 oz/week    1 Standard drinks or equivalent per week     Comment: rare  . Drug Use: No  . Sexual Activity: Yes    Birth Control/ Protection: Surgical     Comment: HYst   Other Topics Concern  . Not on file   Social History Narrative    Past Surgical History  Procedure Laterality Date  . Cesarean section    . Abdominal hysterectomy  1999    menorrhagia    Family History  Problem Relation Age of Onset  . Arthritis Mother   . Diabetes Mother   . Heart disease Mother   . Mental illness Father     Allergies  Allergen Reactions  . Lisinopril     REACTION: cough  . Risperidone     REACTION: unsure of dose; pt describes reaction of bad cough     Current Outpatient Prescriptions on File Prior to Visit  Medication Sig Dispense Refill  . Naproxen Sodium (ALEVE PO) Take by mouth.     No current facility-administered medications on file prior to visit.    BP 110/70 mmHg  Pulse 68  Temp(Src) 98.1 F (36.7 C) (Oral)  Resp 20  Ht 5' 4.25" (1.632 m)  Wt 200 lb (90.719 kg)  BMI 34.06 kg/m2       Review of Systems  Constitutional: Negative for fever, appetite change, fatigue and unexpected weight change.  HENT: Negative for congestion, dental problem, ear pain, hearing loss, mouth sores, nosebleeds, sinus pressure, sore throat, tinnitus, trouble swallowing and voice change.   Eyes: Negative for photophobia, pain, redness and visual disturbance.  Respiratory: Negative for cough, chest tightness and shortness of breath.   Cardiovascular: Negative for chest pain, palpitations and leg swelling.  Gastrointestinal: Negative for nausea, vomiting, abdominal pain, diarrhea, constipation, blood in stool, abdominal distention and rectal pain.  Genitourinary: Negative for dysuria, urgency, frequency, hematuria, flank pain, vaginal bleeding, vaginal discharge, difficulty urinating, genital sores, vaginal pain, menstrual problem and pelvic pain.  Musculoskeletal: Negative for back pain, arthralgias and neck stiffness.  Skin: Negative for rash.  Neurological: Negative for dizziness, syncope, speech difficulty, weakness, light-headedness, numbness and headaches.  Hematological: Negative for  adenopathy. Does not bruise/bleed easily.  Psychiatric/Behavioral: Negative for suicidal ideas, behavioral problems, self-injury, dysphoric mood and agitation. The patient is not nervous/anxious.        Objective:   Physical Exam  Constitutional: She is oriented to person, place, and time. She appears well-developed and well-nourished.  HENT:  Head: Normocephalic and atraumatic.  Right Ear: External ear normal.  Left Ear: External ear normal.   Mouth/Throat: Oropharynx is clear and moist.  Eyes: Conjunctivae and EOM are normal.  Neck: Normal range of motion. Neck supple. No JVD present. No thyromegaly present.  Cardiovascular: Normal rate, regular rhythm, normal heart sounds and intact distal pulses.   No murmur heard. Pulmonary/Chest: Effort normal and breath sounds normal. She has no wheezes. She has no rales.  Abdominal: Soft. Bowel sounds are normal. She exhibits no distension and no mass. There is no tenderness. There is no rebound and no guarding.  Musculoskeletal: Normal range of motion. She exhibits no edema or tenderness.  Neurological: She is alert and oriented to person, place, and time. She has normal reflexes. No cranial nerve deficit. She exhibits normal muscle tone. Coordination normal.  Skin: Skin is warm and dry. No rash noted.  Psychiatric: She has a normal mood and affect. Her behavior is normal.          Assessment & Plan:   Preventive health exam Hypertension.  Controlled today.  Off therapy.  We'll continue to monitor  Exogenous obesity. Weight loss encouraged Dyslipidemia.  Patient does not meet current guidelines for treating with a CHD risk of 5%. strongly opposed to statin therapy  due to concern of precipitating diabetes

## 2014-08-17 NOTE — Progress Notes (Signed)
Pre visit review using our clinic review tool, if applicable. No additional management support is needed unless otherwise documented below in the visit note. 

## 2015-03-21 ENCOUNTER — Encounter: Payer: Self-pay | Admitting: Gynecology

## 2015-05-17 ENCOUNTER — Ambulatory Visit (INDEPENDENT_AMBULATORY_CARE_PROVIDER_SITE_OTHER): Payer: 59 | Admitting: Gynecology

## 2015-05-17 ENCOUNTER — Encounter: Payer: Self-pay | Admitting: Gynecology

## 2015-05-17 ENCOUNTER — Other Ambulatory Visit (HOSPITAL_COMMUNITY)
Admission: RE | Admit: 2015-05-17 | Discharge: 2015-05-17 | Disposition: A | Discharge status: Home / Self Care | Payer: 59 | Source: Ambulatory Visit | Attending: Gynecology | Admitting: Gynecology

## 2015-05-17 ENCOUNTER — Telehealth: Payer: Self-pay | Admitting: *Deleted

## 2015-05-17 VITALS — BP 130/80 | Ht 65.0 in | Wt 199.0 lb

## 2015-05-17 DIAGNOSIS — N952 Postmenopausal atrophic vaginitis: Secondary | ICD-10-CM

## 2015-05-17 DIAGNOSIS — Z01419 Encounter for gynecological examination (general) (routine) without abnormal findings: Secondary | ICD-10-CM | POA: Insufficient documentation

## 2015-05-17 DIAGNOSIS — L80 Vitiligo: Secondary | ICD-10-CM | POA: Diagnosis not present

## 2015-05-17 DIAGNOSIS — N898 Other specified noninflammatory disorders of vagina: Secondary | ICD-10-CM

## 2015-05-17 LAB — WET PREP FOR TRICH, YEAST, CLUE: Trich, Wet Prep: NONE SEEN

## 2015-05-17 MED ORDER — FLUCONAZOLE 150 MG PO TABS: 150.0000 mg | ORAL_TABLET | Freq: Once | ORAL | Status: DC

## 2015-05-17 NOTE — Telephone Encounter (Signed)
Pt informed with the below note, Rx sent. 

## 2015-05-17 NOTE — Progress Notes (Addendum)
Vanessa Price 23-Nov-1955 [id number]        60 y.o.  [id number]  for annual exam.  Several issues noted below.  Past medical history,surgical history, problem list, medications, allergies, family history and social history were all reviewed and documented as reviewed in the EPIC chart.  ROS:  Performed with pertinent positives and negatives included in the history, assessment and plan.   Additional significant findings :  none   Exam: [name] assistant Filed Vitals:   05/17/15 0818  BP: 130/80  Height: 5\' 5"  (1.651 m)  Weight: 199 lb (90.266 kg)   General appearance:  Normal affect, orientation and appearance. Skin: Grossly normal HEENT: Without gross lesions.  No cervical or supraclavicular adenopathy. Thyroid normal.  Lungs:  Clear without wheezing, rales or rhonchi Cardiac: RR, without RMG Abdominal:  Soft, nontender, without masses, guarding, rebound, organomegaly or hernia Breasts:  Examined lying and sitting without masses, retractions, discharge or axillary adenopathy. Pelvic:  Ext/BUS/vagina with atrophic changes. Symmetrical will undergo from periclitoral hood to perianal region. Does have some flattening of skinfolds in the periclitoral region to suggest possible lichen sclerosis.  Wet prep done. Slight white discharge noted  Adnexa without masses or tenderness    Anus and perineum normal   Rectovaginal normal sphincter tone without palpated masses or tenderness.    Assessment/Plan:  60 y.o. [id number] female for annual exam.   1. Status post TAH for menorrhagia by Dr. Ree Edman. No significant hot flashes, night sweats, vaginal dryness or dyspareunia. We'll continue to monitor. 2. Vitiligo encircling the introital opening. Question of lichen sclerosis in the periclitoral region. I discussed with the patient multiple times about biopsies for definitive diagnosis and she is declined in the past noting that this is always been this way. I strongly suggested that we go  ahead and biopsy a representative area just we have a definitive diagnoses. Possible increased risk of skin cancer with lichen sclerosis discussed. Patient agrees for biopsy go ahead and schedule this. 3. Slight vaginal discharge. History of Trichomonas 2013 Pap smear. Declined STD screening. Wet prep today that show slight yeast. Will treat with Diflucan 50 mg 1 dose. 4. Pap smear 2013.  Baseline Pap smear done today. Options to stop screening and she is status post hysterectomy reviewed. Will readdress on an annual basis. 5. DEXA never. We talked about doing a baseline study in the past and she has declined preferring to wait until age 13. She understands the risks of silent osteopenia/osteoporosis.  Increased calcium and vitamin D reviewed. 6. Mammography coming due in March and I reminded her to schedule this. SBE monthly reviewed. 7. Colonoscopy 2009 with reported repeat interval 10 years. 8. Health maintenance. No routine lab work done as patient reports this done at her primary physician's office.   Anastasio Auerbach MD, 9:01 AM 05/17/2015

## 2015-05-17 NOTE — Telephone Encounter (Signed)
-----   Message from Anastasio Auerbach, MD sent at 05/17/2015  9:07 AM EST ----- Tell patient I did a wet prep came back after she left it did show a little bit of yeast. Recommend Diflucan 150 mg 1 dose.

## 2015-05-17 NOTE — Patient Instructions (Signed)
Follow up for vulvar biopsy as scheduled.  You may obtain a copy of any labs that were done today by logging onto MyChart as outlined in the instructions provided with your AVS (after visit summary). The office will not call with normal lab results but certainly if there are any significant abnormalities then we will contact you.   Health Maintenance Adopting a healthy lifestyle and getting preventive care can go a long way to promote health and wellness. Talk with your health care provider about what schedule of regular examinations is right for you. This is a good chance for you to check in with your provider about disease prevention and staying healthy. In between checkups, there are plenty of things you can do on your own. Experts have done a lot of research about which lifestyle changes and preventive measures are most likely to keep you healthy. Ask your health care provider for more information. WEIGHT AND DIET  Eat a healthy diet  Be sure to include plenty of vegetables, fruits, low-fat dairy products, and lean protein.  Do not eat a lot of foods high in solid fats, added sugars, or salt.  Get regular exercise. This is one of the most important things you can do for your health.  Most adults should exercise for at least 150 minutes each week. The exercise should increase your heart rate and make you sweat (moderate-intensity exercise).  Most adults should also do strengthening exercises at least twice a week. This is in addition to the moderate-intensity exercise.  Maintain a healthy weight  Body mass index (BMI) is a measurement that can be used to identify possible weight problems. It estimates body fat based on height and weight. Your health care provider can help determine your BMI and help you achieve or maintain a healthy weight.  For females 39 years of age and older:   A BMI below 18.5 is considered underweight.  A BMI of 18.5 to 24.9 is normal.  A BMI of 25 to 29.9 is  considered overweight.  A BMI of 30 and above is considered obese.  Watch levels of cholesterol and blood lipids  You should start having your blood tested for lipids and cholesterol at 62 years of age, then have this test every 5 years.  You may need to have your cholesterol levels checked more often if:  Your lipid or cholesterol levels are high.  You are older than 62 years of age.  You are at high risk for heart disease.  CANCER SCREENING   Lung Cancer  Lung cancer screening is recommended for adults 49-36 years old who are at high risk for lung cancer because of a history of smoking.  A yearly low-dose CT scan of the lungs is recommended for people who:  Currently smoke.  Have quit within the past 15 years.  Have at least a 30-pack-year history of smoking. A pack year is smoking an average of one pack of cigarettes a day for 1 year.  Yearly screening should continue until it has been 15 years since you quit.  Yearly screening should stop if you develop a health problem that would prevent you from having lung cancer treatment.  Breast Cancer  Practice breast self-awareness. This means understanding how your breasts normally appear and feel.  It also means doing regular breast self-exams. Let your health care provider know about any changes, no matter how small.  If you are in your 20s or 30s, you should have a clinical breast exam (  CBE) by a health care provider every 1-3 years as part of a regular health exam.  If you are 56 or older, have a CBE every year. Also consider having a breast X-ray (mammogram) every year.  If you have a family history of breast cancer, talk to your health care provider about genetic screening.  If you are at high risk for breast cancer, talk to your health care provider about having an MRI and a mammogram every year.  Breast cancer gene (BRCA) assessment is recommended for women who have family members with BRCA-related cancers.  BRCA-related cancers include:  Breast.  Ovarian.  Tubal.  Peritoneal cancers.  Results of the assessment will determine the need for genetic counseling and BRCA1 and BRCA2 testing. Cervical Cancer Routine pelvic examinations to screen for cervical cancer are no longer recommended for nonpregnant women who are considered low risk for cancer of the pelvic organs (ovaries, uterus, and vagina) and who do not have symptoms. A pelvic examination may be necessary if you have symptoms including those associated with pelvic infections. Ask your health care provider if a screening pelvic exam is right for you.   The Pap test is the screening test for cervical cancer for women who are considered at risk.  If you had a hysterectomy for a problem that was not cancer or a condition that could lead to cancer, then you no longer need Pap tests.  If you are older than 65 years, and you have had normal Pap tests for the past 10 years, you no longer need to have Pap tests.  If you have had past treatment for cervical cancer or a condition that could lead to cancer, you need Pap tests and screening for cancer for at least 20 years after your treatment.  If you no longer get a Pap test, assess your risk factors if they change (such as having a new sexual partner). This can affect whether you should start being screened again.  Some women have medical problems that increase their chance of getting cervical cancer. If this is the case for you, your health care provider may recommend more frequent screening and Pap tests.  The human papillomavirus (HPV) test is another test that may be used for cervical cancer screening. The HPV test looks for the virus that can cause cell changes in the cervix. The cells collected during the Pap test can be tested for HPV.  The HPV test can be used to screen women 39 years of age and older. Getting tested for HPV can extend the interval between normal Pap tests from three to  five years.  An HPV test also should be used to screen women of any age who have unclear Pap test results.  After 62 years of age, women should have HPV testing as often as Pap tests.  Colorectal Cancer  This type of cancer can be detected and often prevented.  Routine colorectal cancer screening usually begins at 62 years of age and continues through 62 years of age.  Your health care provider may recommend screening at an earlier age if you have risk factors for colon cancer.  Your health care provider may also recommend using home test kits to check for hidden blood in the stool.  A small camera at the end of a tube can be used to examine your colon directly (sigmoidoscopy or colonoscopy). This is done to check for the earliest forms of colorectal cancer.  Routine screening usually begins at age 77.  Direct examination of the colon should be repeated every 5-10 years through 62 years of age. However, you may need to be screened more often if early forms of precancerous polyps or small growths are found. Skin Cancer  Check your skin from head to toe regularly.  Tell your health care provider about any new moles or changes in moles, especially if there is a change in a mole's shape or color.  Also tell your health care provider if you have a mole that is larger than the size of a pencil eraser.  Always use sunscreen. Apply sunscreen liberally and repeatedly throughout the day.  Protect yourself by wearing long sleeves, pants, a wide-brimmed hat, and sunglasses whenever you are outside. HEART DISEASE, DIABETES, AND HIGH BLOOD PRESSURE   Have your blood pressure checked at least every 1-2 years. High blood pressure causes heart disease and increases the risk of stroke.  If you are between 76 years and 55 years old, ask your health care provider if you should take aspirin to prevent strokes.  Have regular diabetes screenings. This involves taking a blood sample to check your  fasting blood sugar level.  If you are at a normal weight and have a low risk for diabetes, have this test once every three years after 62 years of age.  If you are overweight and have a high risk for diabetes, consider being tested at a younger age or more often. PREVENTING INFECTION  Hepatitis B  If you have a higher risk for hepatitis B, you should be screened for this virus. You are considered at high risk for hepatitis B if:  You were born in a country where hepatitis B is common. Ask your health care provider which countries are considered high risk.  Your parents were born in a high-risk country, and you have not been immunized against hepatitis B (hepatitis B vaccine).  You have HIV or AIDS.  You use needles to inject street drugs.  You live with someone who has hepatitis B.  You have had sex with someone who has hepatitis B.  You get hemodialysis treatment.  You take certain medicines for conditions, including cancer, organ transplantation, and autoimmune conditions. Hepatitis C  Blood testing is recommended for:  Everyone born from [birth year] through 1967.  Anyone with known risk factors for hepatitis C. Sexually transmitted infections (STIs)  You should be screened for sexually transmitted infections (STIs) including gonorrhea and chlamydia if:  You are sexually active and are younger than 62 years of age.  You are older than 62 years of age and your health care provider tells you that you are at risk for this type of infection.  Your sexual activity has changed since you were last screened and you are at an increased risk for chlamydia or gonorrhea. Ask your health care provider if you are at risk.  If you do not have HIV, but are at risk, it may be recommended that you take a prescription medicine daily to prevent HIV infection. This is called pre-exposure prophylaxis (PrEP). You are considered at risk if:  You are sexually active and do not regularly use condoms or  know the HIV status of your partner(s).  You take drugs by injection.  You are sexually active with a partner who has HIV. Talk with your health care provider about whether you are at high risk of being infected with HIV. If you choose to begin PrEP, you should first be tested for HIV. You should then be  tested every 3 months for as long as you are taking PrEP.  PREGNANCY   If you are premenopausal and you may become pregnant, ask your health care provider about preconception counseling.  If you may become pregnant, take 400 to 800 micrograms (mcg) of folic acid every day.  If you want to prevent pregnancy, talk to your health care provider about birth control (contraception). OSTEOPOROSIS AND MENOPAUSE   Osteoporosis is a disease in which the bones lose minerals and strength with aging. This can result in serious bone fractures. Your risk for osteoporosis can be identified using a bone density scan.  If you are 25 years of age or older, or if you are at risk for osteoporosis and fractures, ask your health care provider if you should be screened.  Ask your health care provider whether you should take a calcium or vitamin D supplement to lower your risk for osteoporosis.  Menopause may have certain physical symptoms and risks.  Hormone replacement therapy may reduce some of these symptoms and risks. Talk to your health care provider about whether hormone replacement therapy is right for you.  HOME CARE INSTRUCTIONS   Schedule regular health, dental, and eye exams.  Stay current with your immunizations.   Do not use any tobacco products including cigarettes, chewing tobacco, or electronic cigarettes.  If you are pregnant, do not drink alcohol.  If you are breastfeeding, limit how much and how often you drink alcohol.  Limit alcohol intake to no more than 1 drink per day for nonpregnant women. One drink equals 12 ounces of beer, 5 ounces of wine, or 1 ounces of hard liquor.  Do  not use street drugs.  Do not share needles.  Ask your health care provider for help if you need support or information about quitting drugs.  Tell your health care provider if you often feel depressed.  Tell your health care provider if you have ever been abused or do not feel safe at home. Document Released: 09/23/2010 Document Revised: 07/25/2013 Document Reviewed: 02/09/2013 [facility] Patient Information 2015 [place], [place]. This information is not intended to replace advice given to you by your health care provider. Make sure you discuss any questions you have with your health care provider.

## 2015-05-17 NOTE — Addendum Note (Signed)
Addended by: Nelva Nay on: 05/17/2015 09:36 AM   Modules accepted: Orders

## 2015-05-18 LAB — URINALYSIS W MICROSCOPIC + REFLEX CULTURE
Bilirubin Urine: NEGATIVE
Casts: NONE SEEN [LPF]
Glucose, UA: NEGATIVE
Hgb urine dipstick: NEGATIVE
Ketones, ur: NEGATIVE
Leukocytes, UA: NEGATIVE
Nitrite: POSITIVE — AB
Specific Gravity, Urine: 1.025 (ref 1.001–1.035)
Yeast: NONE SEEN [HPF]
pH: 5.5 (ref 5.0–8.0)

## 2015-05-18 LAB — CYTOLOGY - PAP

## 2015-05-21 ENCOUNTER — Other Ambulatory Visit: Payer: Self-pay | Admitting: Gynecology

## 2015-05-21 LAB — URINE CULTURE: Colony Count: >=100000

## 2015-05-21 MED ORDER — SULFAMETHOXAZOLE-TRIMETHOPRIM 800-160 MG PO TABS: 1.0000 | ORAL_TABLET | Freq: Two times a day (BID) | ORAL | Status: DC

## 2015-05-30 LAB — HM MAMMOGRAPHY

## 2015-05-31 ENCOUNTER — Encounter: Payer: Self-pay | Admitting: Internal Medicine

## 2015-05-31 ENCOUNTER — Encounter: Payer: Self-pay | Admitting: Gynecology

## 2015-06-21 ENCOUNTER — Telehealth: Payer: Self-pay | Admitting: Internal Medicine

## 2015-06-21 ENCOUNTER — Ambulatory Visit (INDEPENDENT_AMBULATORY_CARE_PROVIDER_SITE_OTHER): Payer: 59 | Admitting: Physician Assistant

## 2015-06-21 VITALS — BP 148/80 | HR 62 | Temp 98.9°F | Resp 16 | Ht 66.0 in | Wt 199.0 lb

## 2015-06-21 DIAGNOSIS — R059 Cough, unspecified: Secondary | ICD-10-CM

## 2015-06-21 DIAGNOSIS — R05 Cough: Secondary | ICD-10-CM | POA: Diagnosis not present

## 2015-06-21 DIAGNOSIS — M791 Myalgia, unspecified site: Secondary | ICD-10-CM

## 2015-06-21 LAB — POCT CBC
Granulocyte percent: 56.3 %G (ref 37–80)
HCT, POC: 38.2 % (ref 37.7–47.9)
Hemoglobin: 13.4 g/dL (ref 12.2–16.2)
Lymph, poc: 2.2 (ref 0.6–3.4)
MCH, POC: 28.7 pg (ref 27–31.2)
MCHC: 35.2 g/dL (ref 31.8–35.4)
MCV: 81.6 fL (ref 80–97)
MID (cbc): 0.5 (ref 0–0.9)
MPV: 7.2 fL (ref 0–99.8)
POC Granulocyte: 3.4 (ref 2–6.9)
POC LYMPH PERCENT: 36 %L (ref 10–50)
POC MID %: 7.7 %M (ref 0–12)
Platelet Count, POC: 243 10*3/uL (ref 142–424)
RBC: 4.68 M/uL (ref 4.04–5.48)
RDW, POC: 14.3 %
WBC: 6.1 10*3/uL (ref 4.6–10.2)

## 2015-06-21 MED ORDER — HYDROCODONE-HOMATROPINE 5-1.5 MG/5ML PO SYRP: 2.5000 mL | ORAL_SOLUTION | Freq: Every evening | ORAL | Status: DC | PRN

## 2015-06-21 NOTE — Telephone Encounter (Signed)
[facility] - Client [facility] Patient Name: Vanessa Price DOB: 18-May-1955 Initial Comment Caller stated she thinks she has flu Nurse Assessment Nurse: Martyn Ehrich, RN, Felicia Date/Time (Eastern Time): 06/21/2015 3:12:48 PM Confirm and document reason for call. If symptomatic, describe symptoms. You must click the next button to save text entered. ---PT thinks she has the flu. She started coughing onset less than one hour ago and had a nose bleed and coughed up blood at the same time. Chills off and on and malaise since Mon. Some congestion onset Monday. (no coughing up blood that was not related to nosebleed). 98.4 oral now. Started feeling better until she started coughing. She does not think she has had a fever today - she did perhaps earlier this week. Has the patient traveled out of the country within the last 30 days? ---No Does the patient have any new or worsening symptoms? ---Yes Will a triage be completed? ---Yes Related visit to physician within the last 2 weeks? ---NoDoes the PT have any chronic conditions? (i.e. diabetes, asthma, etc.) ---No Is this a behavioral health or substance abuse call? ---No Guidelines Guideline Title Affirmed Question Affirmed Notes Influenza - Seasonal [1] Probable mild influenza (no fever) or a common cold, with no complications (all triage questions negative) AND [2] NOT HIGH RISK Nosebleed [1] Mild-moderate nosebleed AND [2] bleeding stopped now (all triage questions negative) Asthma Attack [1] Mild wheezing is intermittent AND [2] persists > 3 days Final Disposition User See Physician within 4 Hours (or PCP triage) Martyn Ehrich, RN, Felicia Comments she is coughing pretty bad from what nurse can hear - can hear some wheeze - she said she has been coughing at night for 1 week. Pt also feels she has a wheeze Referrals [facility] Call Id:  [id number] Disagree/Comply: Comply

## 2015-06-21 NOTE — Progress Notes (Signed)
06/27/2015 12:32 PM   DOB: 11-Jan-1956 / MRN: [id number]  SUBJECTIVE:  Kenneshia Ordiway is a 60 y.o. female presenting for wheezing, cough, chills, and myalgia.  This symptoms have been present for 4 days.  She has tried several OTC medications without relief.    She is allergic to lisinopril and risperidone.   She  has a past medical history of ANXIETY (06/04/2009); GERD (08/14/2008); HYPERLIPIDEMIA (09/14/2006); HYPERTENSION (09/14/2006); MYOCARDIAL PERFUSION SCAN, WITH STRESS TEST, ABNORMAL (10/19/2008); PREMATURE VENTRICULAR CONTRACTIONS (08/14/2008); and Impaired glucose tolerance.    She  reports that she quit smoking about 22 years ago. She has never used smokeless tobacco. She reports that she drinks about 0.6 oz of alcohol per week. She reports that she does not use illicit drugs. She  reports that she currently engages in sexual activity. She reports using the following method of birth control/protection: Surgical. The patient  has past surgical history that includes Cesarean section and Abdominal hysterectomy (1999).  Her family history includes Arthritis in her mother; Diabetes in her mother; Heart disease in her mother; Mental illness in her father.  Review of Systems  Constitutional: Negative for fever (as of today) and chills.  Respiratory: Positive for cough and wheezing.   Cardiovascular: Negative for chest pain.  Gastrointestinal: Negative for nausea.  Genitourinary: Negative for dysuria.  Skin: Negative for rash.  Neurological: Negative for dizziness and headaches.    Problem list and medications reviewed and updated by myself where necessary, and exist elsewhere in the encounter.   OBJECTIVE:  BP 148/80 mmHg  Pulse 62  Temp(Src) 98.9 F (37.2 C) (Oral)  Resp 16  Ht 5\' 6"  (1.676 m)  Wt 199 lb (90.266 kg)  BMI 32.13 kg/m2  SpO2 98%  Physical Exam  Constitutional: She is oriented to person, place, and time. She appears well-developed.  HENT:  Right Ear:  Tympanic membrane normal.  Left Ear: Tympanic membrane normal.  Nose: Mucosal edema present.  Mouth/Throat: Uvula is midline, oropharynx is clear and moist and mucous membranes are normal.  Eyes: EOM are normal. Pupils are equal, round, and reactive to light.  Cardiovascular: Normal rate.   Pulmonary/Chest: Effort normal.  Abdominal: She exhibits no distension.  Musculoskeletal: Normal range of motion.  Neurological: She is alert and oriented to person, place, and time. No cranial nerve deficit.  Skin: Skin is warm and dry. She is not diaphoretic.  Psychiatric: She has a normal mood and affect.  Vitals reviewed.   Results for orders placed or performed in visit on 06/21/15  POCT CBC  Result Value Ref Range   WBC 6.1 4.6 - 10.2 K/uL   Lymph, poc 2.2 0.6 - 3.4   POC LYMPH PERCENT 36.0 10 - 50 %L   MID (cbc) 0.5 0 - 0.9   POC MID % 7.7 0 - 12 %M   POC Granulocyte 3.4 2 - 6.9   Granulocyte percent 56.3 37 - 80 %G   RBC 4.68 4.04 - 5.48 M/uL   Hemoglobin 13.4 12.2 - 16.2 g/dL   HCT, POC 38.2 37.7 - 47.9 %   MCV 81.6 80 - 97 fL   MCH, POC 28.7 27 - 31.2 pg   MCHC 35.2 31.8 - 35.4 g/dL   RDW, POC 14.3 %   Platelet Count, POC 243 142 - 424 K/uL   MPV 7.2 0 - 99.8 fL    ASSESSMENT AND PLAN  Charmel was seen today for wheezing, cough, chills and generalized body aches.  Diagnoses and all  orders for this visit:  Cough: Lungs clear.  CBC reassuring. This is likely a resolving flu.  Will start her on a short course of opioids for pain and cough.   -     POCT CBC -     HYDROcodone-homatropine (HYCODAN) 5-1.5 MG/5ML syrup; Take 2.5-5 mLs by mouth at bedtime as needed.  Myalgia: See problem one.      The patient was advised to call or return to clinic if she does not see an improvement in symptoms or to seek the care of the closest emergency department if she worsens with the above plan.   Philis Fendt, MHS, PA-C [facility] 06/27/2015 12:32 PM

## 2015-06-21 NOTE — Telephone Encounter (Signed)
Will monitor to make sure pt went to urgent care

## 2015-06-21 NOTE — Patient Instructions (Signed)
     IF you received an x-ray today, you will receive an invoice from [facility]. Please contact [facility] at [phone] with questions or concerns regarding your invoice.   IF you received labwork today, you will receive an invoice from [facility]. Please contact [facility] at [phone] with questions or concerns regarding your invoice.   Our billing staff will not be able to assist you with questions regarding bills from these companies.  You will be contacted with the lab results as soon as they are available. The fastest way to get your results is to activate your My Chart account. Instructions are located on the last page of this paperwork. If you have not heard from us regarding the results in 2 weeks, please contact this office.      

## 2015-06-21 NOTE — Telephone Encounter (Signed)
Vanessa Price to the pt and she stated she was at the doctor's office.  She did state there was a 2 hour wait.  Encouraged her to stay to be seen.

## 2015-06-22 NOTE — Telephone Encounter (Signed)
Noted, pt was seen.

## 2015-09-20 ENCOUNTER — Other Ambulatory Visit (INDEPENDENT_AMBULATORY_CARE_PROVIDER_SITE_OTHER): Payer: 59

## 2015-09-20 DIAGNOSIS — R7989 Other specified abnormal findings of blood chemistry: Secondary | ICD-10-CM | POA: Diagnosis not present

## 2015-09-20 DIAGNOSIS — Z0000 Encounter for general adult medical examination without abnormal findings: Secondary | ICD-10-CM

## 2015-09-20 LAB — CBC WITH DIFFERENTIAL/PLATELET
Basophils Absolute: 0 10*3/uL (ref 0.0–0.1)
Basophils Relative: 0.6 % (ref 0.0–3.0)
Eosinophils Absolute: 0.1 10*3/uL (ref 0.0–0.7)
Eosinophils Relative: 2.3 % (ref 0.0–5.0)
HCT: 40.2 % (ref 36.0–46.0)
Hemoglobin: 13.5 g/dL (ref 12.0–15.0)
Lymphocytes Relative: 43.2 % (ref 12.0–46.0)
Lymphs Abs: 2.3 10*3/uL (ref 0.7–4.0)
MCHC: 33.6 g/dL (ref 30.0–36.0)
MCV: 82.1 fl (ref 78.0–100.0)
Monocytes Absolute: 0.3 10*3/uL (ref 0.1–1.0)
Monocytes Relative: 4.9 % (ref 3.0–12.0)
Neutro Abs: 2.6 10*3/uL (ref 1.4–7.7)
Neutrophils Relative %: 49 % (ref 43.0–77.0)
Platelets: 227 10*3/uL (ref 150.0–400.0)
RBC: 4.89 Mil/uL (ref 3.87–5.11)
RDW: 15.4 % (ref 11.5–15.5)
WBC: 5.3 10*3/uL (ref 4.0–10.5)

## 2015-09-20 LAB — LIPID PANEL
Cholesterol: 303 mg/dL — ABNORMAL HIGH (ref 0–200)
HDL: 34.7 mg/dL — ABNORMAL LOW (ref >39.00)
Total CHOL/HDL Ratio: 9
Triglycerides: 774 mg/dL — ABNORMAL HIGH (ref 0.0–149.0)

## 2015-09-20 LAB — BASIC METABOLIC PANEL
BUN: 18 mg/dL (ref 6–23)
CO2: 29 mEq/L (ref 19–32)
Calcium: 9.5 mg/dL (ref 8.4–10.5)
Chloride: 104 mEq/L (ref 96–112)
Creatinine, Ser: 0.75 mg/dL (ref 0.40–1.20)
GFR: 100.74 mL/min (ref >60.00)
Glucose, Bld: 111 mg/dL — ABNORMAL HIGH (ref 70–99)
Potassium: 3.6 mEq/L (ref 3.5–5.1)
Sodium: 140 mEq/L (ref 135–145)

## 2015-09-20 LAB — HEPATIC FUNCTION PANEL
ALT: 15 U/L (ref 0–35)
AST: 15 U/L (ref 0–37)
Albumin: 4.2 g/dL (ref 3.5–5.2)
Alkaline Phosphatase: 76 U/L (ref 39–117)
Bilirubin, Direct: 0 mg/dL (ref 0.0–0.3)
Total Bilirubin: 0.3 mg/dL (ref 0.2–1.2)
Total Protein: 7 g/dL (ref 6.0–8.3)

## 2015-09-20 LAB — TSH: TSH: 1.97 u[IU]/mL (ref 0.35–4.50)

## 2015-09-20 LAB — POC URINALSYSI DIPSTICK (AUTOMATED)
Bilirubin, UA: NEGATIVE
Glucose, UA: NEGATIVE
Ketones, UA: NEGATIVE
Leukocytes, UA: NEGATIVE
Nitrite, UA: NEGATIVE
Spec Grav, UA: >=1.03
Urobilinogen, UA: 0.2
pH, UA: 5

## 2015-09-20 LAB — LDL CHOLESTEROL, DIRECT: Direct LDL: 156 mg/dL

## 2015-10-02 ENCOUNTER — Ambulatory Visit (INDEPENDENT_AMBULATORY_CARE_PROVIDER_SITE_OTHER): Payer: 59 | Admitting: Internal Medicine

## 2015-10-02 ENCOUNTER — Encounter: Payer: Self-pay | Admitting: Internal Medicine

## 2015-10-02 VITALS — BP 162/90 | HR 96 | Temp 98.4°F | Ht 66.25 in | Wt 201.0 lb

## 2015-10-02 DIAGNOSIS — E785 Hyperlipidemia, unspecified: Secondary | ICD-10-CM | POA: Diagnosis not present

## 2015-10-02 DIAGNOSIS — E049 Nontoxic goiter, unspecified: Secondary | ICD-10-CM | POA: Diagnosis not present

## 2015-10-02 DIAGNOSIS — R7302 Impaired glucose tolerance (oral): Secondary | ICD-10-CM | POA: Diagnosis not present

## 2015-10-02 DIAGNOSIS — I10 Essential (primary) hypertension: Secondary | ICD-10-CM

## 2015-10-02 NOTE — Progress Notes (Signed)
Pre visit review using our clinic review tool, if applicable. No additional management support is needed unless otherwise documented below in the visit note. 

## 2015-10-02 NOTE — Progress Notes (Signed)
Subjective:    Patient ID: Vanessa Price, female    DOB: June 25, 1955, 60 y.o.   MRN: [id number]  HPI  Pre-visit discussion using our clinic review tool. No additional management support is needed unless otherwise documented below in the visit note.  60  year-old patient who is seen today for a wellness exam.  She has a history of hypertension but presently managed off medication. She does monitor home blood pressure readings. She has been seen recently by gynecology. She has a history of dyslipidemia and has been treated with atorvastatin in the past she is fearful of statin side effects. She does have a history of exogenous obesity.  Wt Readings from Last 3 Encounters:  10/02/15 201 lb (91.173 kg)  06/21/15 199 lb (90.266 kg)  05/17/15 199 lb (90.266 kg)    Past Medical History  Diagnosis Date  . ANXIETY 06/04/2009  . GERD 08/14/2008  . HYPERLIPIDEMIA 09/14/2006  . HYPERTENSION 09/14/2006  . MYOCARDIAL PERFUSION SCAN, WITH STRESS TEST, ABNORMAL 10/19/2008  . PREMATURE VENTRICULAR CONTRACTIONS 08/14/2008  . Impaired glucose tolerance     Social History   Social History  . Marital Status: Married    Spouse Name: N/A  . Number of Children: N/A  . Years of Education: N/A   Occupational History  . Not on file.   Social History Main Topics  . Smoking status: Former Smoker    Quit date: 03/24/1993  . Smokeless tobacco: Never Used  . Alcohol Use: 0.6 oz/week    1 Standard drinks or equivalent per week     Comment: rare  . Drug Use: No  . Sexual Activity: Yes    Birth Control/ Protection: Surgical     Comment: HYst-1st intercourse 60 yo-More than 5 partners   Other Topics Concern  . Not on file   Social History Narrative    Past Surgical History  Procedure Laterality Date  . Cesarean section    . Abdominal hysterectomy  1999    menorrhagia    Family History  Problem Relation Age of Onset  . Arthritis Mother   . Diabetes Mother   . Heart disease Mother    . Mental illness Father     Allergies  Allergen Reactions  . Lisinopril     REACTION: cough  . Risperidone     REACTION: unsure of dose; pt describes reaction of bad cough    Current Outpatient Prescriptions on File Prior to Visit  Medication Sig Dispense Refill  . Multiple Vitamin (MULTIVITAMIN) tablet Take 1 tablet by mouth daily.     No current facility-administered medications on file prior to visit.    BP 162/90 mmHg  Pulse 96  Temp(Src) 98.4 F (36.9 C) (Oral)  Ht 5' 6.25" (1.683 m)  Wt 201 lb (91.173 kg)  BMI 32.19 kg/m2  SpO2 97%       Review of Systems  Constitutional: Negative for fever, appetite change, fatigue and unexpected weight change.  HENT: Negative for congestion, dental problem, ear pain, hearing loss, mouth sores, nosebleeds, sinus pressure, sore throat, tinnitus, trouble swallowing and voice change.   Eyes: Negative for photophobia, pain, redness and visual disturbance.  Respiratory: Negative for cough, chest tightness and shortness of breath.   Cardiovascular: Negative for chest pain, palpitations and leg swelling.  Gastrointestinal: Negative for nausea, vomiting, abdominal pain, diarrhea, constipation, blood in stool, abdominal distention and rectal pain.  Genitourinary: Negative for dysuria, urgency, frequency, hematuria, flank pain, vaginal bleeding, vaginal discharge, difficulty urinating, genital sores, vaginal  pain, menstrual problem and pelvic pain.  Musculoskeletal: Negative for back pain, arthralgias and neck stiffness.  Skin: Negative for rash.  Neurological: Negative for dizziness, syncope, speech difficulty, weakness, light-headedness, numbness and headaches.  Hematological: Negative for adenopathy. Does not bruise/bleed easily.  Psychiatric/Behavioral: Negative for suicidal ideas, behavioral problems, self-injury, dysphoric mood and agitation. The patient is not nervous/anxious.        Objective:   Physical Exam  Constitutional:  She is oriented to person, place, and time. She appears well-developed and well-nourished.  HENT:  Head: Normocephalic and atraumatic.  Right Ear: External ear normal.  Left Ear: External ear normal.  Mouth/Throat: Oropharynx is clear and moist.  Eyes: Conjunctivae and EOM are normal.  Neck: Normal range of motion. Neck supple. No JVD present. No thyromegaly present.  Cardiovascular: Normal rate, regular rhythm, normal heart sounds and intact distal pulses.   No murmur heard. Pulmonary/Chest: Effort normal and breath sounds normal. She has no wheezes. She has no rales.  Abdominal: Soft. Bowel sounds are normal. She exhibits no distension and no mass. There is no tenderness. There is no rebound and no guarding.  Musculoskeletal: Normal range of motion. She exhibits no edema or tenderness.  Neurological: She is alert and oriented to person, place, and time. She has normal reflexes. No cranial nerve deficit. She exhibits normal muscle tone. Coordination normal.  Skin: Skin is warm and dry. No rash noted.  Psychiatric: She has a normal mood and affect. Her behavior is normal.          Assessment & Plan:   Preventive health exam Hypertension.  Controlled today.  Off therapy.  We'll continue to monitor  Exogenous obesity. Weight loss encouraged Dyslipidemia.  Patient does not meet current guidelines for treating with a CHD risk of 5%. strongly opposed to statin therapy  due to concern of precipitating diabetes

## 2015-10-02 NOTE — Patient Instructions (Addendum)
Limit your sodium (Salt) intake    It is important that you exercise regularly, at least 20 minutes 3 to 4 times per week.  If you develop chest pain or shortness of breath seek  medical attention.  You need to lose weight.  Consider a lower calorie diet and regular exercise.  Please check your blood pressure on a regular basis.  If it is consistently greater than 150/90, please make an office appointment.  Menopause is a normal process in which your reproductive ability comes to an end. This process happens gradually over a span of months to years, usually between the ages of 83 and 5. Menopause is complete when you have missed 12 consecutive menstrual periods. It is important to talk with your health care provider about some of the most common conditions that affect postmenopausal women, such as heart disease, cancer, and bone loss (osteoporosis). Adopting a healthy lifestyle and getting preventive care can help to promote your health and wellness. Those actions can also lower your chances of developing some of these common conditions. WHAT SHOULD I KNOW ABOUT MENOPAUSE? During menopause, you may experience a number of symptoms, such as:  Moderate-to-severe hot flashes.  Night sweats.  Decrease in sex drive.  Mood swings.  Headaches.  Tiredness.  Irritability.  Memory problems.  Insomnia. Choosing to treat or not to treat menopausal changes is an individual decision that you make with your health care provider. WHAT SHOULD I KNOW ABOUT HORMONE REPLACEMENT THERAPY AND SUPPLEMENTS? Hormone therapy products are effective for treating symptoms that are associated with menopause, such as hot flashes and night sweats. Hormone replacement carries certain risks, especially as you become older. If you are thinking about using estrogen or estrogen with progestin treatments, discuss the benefits and risks with your health care provider. WHAT SHOULD I KNOW ABOUT HEART DISEASE AND  STROKE? Heart disease, heart attack, and stroke become more likely as you age. This may be due, in part, to the hormonal changes that your body experiences during menopause. These can affect how your body processes dietary fats, triglycerides, and cholesterol. Heart attack and stroke are both medical emergencies. There are many things that you can do to help prevent heart disease and stroke:  Have your blood pressure checked at least every 1-2 years. High blood pressure causes heart disease and increases the risk of stroke.  If you are 52-94 years old, ask your health care provider if you should take aspirin to prevent a heart attack or a stroke.  Do not use any tobacco products, including cigarettes, chewing tobacco, or electronic cigarettes. If you need help quitting, ask your health care provider.  It is important to eat a healthy diet and maintain a healthy weight.  Be sure to include plenty of vegetables, fruits, low-fat dairy products, and lean protein.  Avoid eating foods that are high in solid fats, added sugars, or salt (sodium).  Get regular exercise. This is one of the most important things that you can do for your health.  Try to exercise for at least 150 minutes each week. The type of exercise that you do should increase your heart rate and make you sweat. This is known as moderate-intensity exercise.  Try to do strengthening exercises at least twice each week. Do these in addition to the moderate-intensity exercise.  Know your numbers.Ask your health care provider to check your cholesterol and your blood glucose. Continue to have your blood tested as directed by your health care provider. WHAT SHOULD I  KNOW ABOUT CANCER SCREENING? There are several types of cancer. Take the following steps to reduce your risk and to catch any cancer development as early as possible. Breast Cancer  Practice breast self-awareness.  This means understanding how your breasts normally appear  and feel.  It also means doing regular breast self-exams. Let your health care provider know about any changes, no matter how small.  If you are 40 or older, have a clinician do a breast exam (clinical breast exam or CBE) every year. Depending on your age, family history, and medical history, it may be recommended that you also have a yearly breast X-ray (mammogram).  If you have a family history of breast cancer, talk with your health care provider about genetic screening.  If you are at high risk for breast cancer, talk with your health care provider about having an MRI and a mammogram every year.  Breast cancer (BRCA) gene test is recommended for women who have family members with BRCA-related cancers. Results of the assessment will determine the need for genetic counseling and BRCA1 and for BRCA2 testing. BRCA-related cancers include these types:  Breast. This occurs in males or females.  Ovarian.  Tubal. This may also be called fallopian tube cancer.  Cancer of the abdominal or pelvic lining (peritoneal cancer).  Prostate.  Pancreatic. Cervical, Uterine, and Ovarian Cancer Your health care provider may recommend that you be screened regularly for cancer of the pelvic organs. These include your ovaries, uterus, and vagina. This screening involves a pelvic exam, which includes checking for microscopic changes to the surface of your cervix (Pap test).  For women ages 21-65, health care providers may recommend a pelvic exam and a Pap test every three years. For women ages 42-65, they may recommend the Pap test and pelvic exam, combined with testing for human papilloma virus (HPV), every five years. Some types of HPV increase your risk of cervical cancer. Testing for HPV may also be done on women of any age who have unclear Pap test results.  Other health care providers may not recommend any screening for nonpregnant women who are considered low risk for pelvic cancer and have no  symptoms. Ask your health care provider if a screening pelvic exam is right for you.  If you have had past treatment for cervical cancer or a condition that could lead to cancer, you need Pap tests and screening for cancer for at least 20 years after your treatment. If Pap tests have been discontinued for you, your risk factors (such as having a new sexual partner) need to be reassessed to determine if you should start having screenings again. Some women have medical problems that increase the chance of getting cervical cancer. In these cases, your health care provider may recommend that you have screening and Pap tests more often.  If you have a family history of uterine cancer or ovarian cancer, talk with your health care provider about genetic screening.  If you have vaginal bleeding after reaching menopause, tell your health care provider.  There are currently no reliable tests available to screen for ovarian cancer. Lung Cancer Lung cancer screening is recommended for adults 5-21 years old who are at high risk for lung cancer because of a history of smoking. A yearly low-dose CT scan of the lungs is recommended if you:  Currently smoke.  Have a history of at least 30 pack-years of smoking and you currently smoke or have quit within the past 15 years. A pack-year is  smoking an average of one pack of cigarettes per day for one year. Yearly screening should:  Continue until it has been 15 years since you quit.  Stop if you develop a health problem that would prevent you from having lung cancer treatment. Colorectal Cancer  This type of cancer can be detected and can often be prevented.  Routine colorectal cancer screening usually begins at age 54 and continues through age 65.  If you have risk factors for colon cancer, your health care provider may recommend that you be screened at an earlier age.  If you have a family history of colorectal cancer, talk with your health care provider  about genetic screening.  Your health care provider may also recommend using home test kits to check for hidden blood in your stool.  A small camera at the end of a tube can be used to examine your colon directly (sigmoidoscopy or colonoscopy). This is done to check for the earliest forms of colorectal cancer.  Direct examination of the colon should be repeated every 5-10 years until age 77. However, if early forms of precancerous polyps or small growths are found or if you have a family history or genetic risk for colorectal cancer, you may need to be screened more often. Skin Cancer  Check your skin from head to toe regularly.  Monitor any moles. Be sure to tell your health care provider:  About any new moles or changes in moles, especially if there is a change in a mole's shape or color.  If you have a mole that is larger than the size of a pencil eraser.  If any of your family members has a history of skin cancer, especially at a young age, talk with your health care provider about genetic screening.  Always use sunscreen. Apply sunscreen liberally and repeatedly throughout the day.  Whenever you are outside, protect yourself by wearing long sleeves, pants, a wide-brimmed hat, and sunglasses. WHAT SHOULD I KNOW ABOUT OSTEOPOROSIS? Osteoporosis is a condition in which bone destruction happens more quickly than new bone creation. After menopause, you may be at an increased risk for osteoporosis. To help prevent osteoporosis or the bone fractures that can happen because of osteoporosis, the following is recommended:  If you are 46-50 years old, get at least 1,000 mg of calcium and at least 600 mg of vitamin D per day.  If you are older than age 65 but younger than age 82, get at least 1,200 mg of calcium and at least 600 mg of vitamin D per day.  If you are older than age 47, get at least 1,200 mg of calcium and at least 800 mg of vitamin D per day. Smoking and excessive alcohol intake  increase the risk of osteoporosis. Eat foods that are rich in calcium and vitamin D, and do weight-bearing exercises several times each week as directed by your health care provider. WHAT SHOULD I KNOW ABOUT HOW MENOPAUSE AFFECTS [place]? Depression may occur at any age, but it is more common as you become older. Common symptoms of depression include:  Low or sad mood.  Changes in sleep patterns.  Changes in appetite or eating patterns.  Feeling an overall lack of motivation or enjoyment of activities that you previously enjoyed.  Frequent crying spells. Talk with your health care provider if you think that you are experiencing depression. WHAT SHOULD I KNOW ABOUT IMMUNIZATIONS? It is important that you get and maintain your immunizations. These include:  Tetanus,  diphtheria, and pertussis (Tdap) booster vaccine.  Influenza every year before the flu season begins.  Pneumonia vaccine.  Shingles vaccine. Your health care provider may also recommend other immunizations.   This information is not intended to replace advice given to you by your health care provider. Make sure you discuss any questions you have with your health care provider.   Document Released: 05/02/2005 Document Revised: 03/31/2014 Document Reviewed: 11/10/2013 Elsevier Interactive Patient Education [employer].

## 2016-06-04 DIAGNOSIS — Z1231 Encounter for screening mammogram for malignant neoplasm of breast: Secondary | ICD-10-CM | POA: Diagnosis not present

## 2016-06-04 DIAGNOSIS — Z803 Family history of malignant neoplasm of breast: Secondary | ICD-10-CM | POA: Diagnosis not present

## 2016-06-04 LAB — HM MAMMOGRAPHY

## 2016-06-09 ENCOUNTER — Encounter: Payer: Self-pay | Admitting: Family Medicine

## 2016-06-25 ENCOUNTER — Telehealth: Payer: Self-pay | Admitting: Internal Medicine

## 2016-06-25 NOTE — Telephone Encounter (Signed)
Pt states she has been a long time pt and prefers to have her cpe labs done in advance. Also prefers Dr Raliegh Ip to go over labs at her visit.   Is that ok for the CPX labs to be put in prior to visit?

## 2016-06-30 ENCOUNTER — Other Ambulatory Visit: Payer: Self-pay | Admitting: Internal Medicine

## 2016-06-30 DIAGNOSIS — E7849 Other hyperlipidemia: Secondary | ICD-10-CM

## 2016-06-30 DIAGNOSIS — N183 Chronic kidney disease, stage 3 unspecified: Secondary | ICD-10-CM

## 2016-06-30 DIAGNOSIS — E785 Hyperlipidemia, unspecified: Secondary | ICD-10-CM

## 2016-06-30 DIAGNOSIS — I119 Hypertensive heart disease without heart failure: Secondary | ICD-10-CM

## 2016-06-30 NOTE — Telephone Encounter (Signed)
Lab orders placed in epic. Pt called and made aware, CPE appt on 10/03/16

## 2016-09-18 ENCOUNTER — Other Ambulatory Visit (INDEPENDENT_AMBULATORY_CARE_PROVIDER_SITE_OTHER): Payer: 59

## 2016-09-18 DIAGNOSIS — E784 Other hyperlipidemia: Secondary | ICD-10-CM

## 2016-09-18 DIAGNOSIS — N183 Chronic kidney disease, stage 3 unspecified: Secondary | ICD-10-CM

## 2016-09-18 DIAGNOSIS — I119 Hypertensive heart disease without heart failure: Secondary | ICD-10-CM

## 2016-09-18 DIAGNOSIS — E785 Hyperlipidemia, unspecified: Secondary | ICD-10-CM

## 2016-09-18 DIAGNOSIS — E7849 Other hyperlipidemia: Secondary | ICD-10-CM

## 2016-09-18 LAB — BASIC METABOLIC PANEL
BUN: 13 mg/dL (ref 6–23)
CO2: 25 mEq/L (ref 19–32)
Calcium: 9.4 mg/dL (ref 8.4–10.5)
Chloride: 107 mEq/L (ref 96–112)
Creatinine, Ser: 0.65 mg/dL (ref 0.40–1.20)
GFR: 118.44 mL/min (ref >60.00)
Glucose, Bld: 111 mg/dL — ABNORMAL HIGH (ref 70–99)
Potassium: 4 mEq/L (ref 3.5–5.1)
Sodium: 141 mEq/L (ref 135–145)

## 2016-09-18 LAB — LDL CHOLESTEROL, DIRECT: Direct LDL: 142 mg/dL

## 2016-09-18 LAB — CBC
HCT: 41.4 % (ref 36.0–46.0)
Hemoglobin: 14 g/dL (ref 12.0–15.0)
MCHC: 33.8 g/dL (ref 30.0–36.0)
MCV: 82.8 fl (ref 78.0–100.0)
Platelets: 221 10*3/uL (ref 150.0–400.0)
RBC: 5 Mil/uL (ref 3.87–5.11)
RDW: 14.9 % (ref 11.5–15.5)
WBC: 4.8 10*3/uL (ref 4.0–10.5)

## 2016-09-18 LAB — LIPID PANEL
Cholesterol: 290 mg/dL — ABNORMAL HIGH (ref 0–200)
HDL: 35.1 mg/dL — ABNORMAL LOW (ref >39.00)
Total CHOL/HDL Ratio: 8
Triglycerides: 629 mg/dL — ABNORMAL HIGH (ref 0.0–149.0)

## 2016-09-18 LAB — HEPATIC FUNCTION PANEL
ALT: 14 U/L (ref 0–35)
AST: 13 U/L (ref 0–37)
Albumin: 4.3 g/dL (ref 3.5–5.2)
Alkaline Phosphatase: 78 U/L (ref 39–117)
Bilirubin, Direct: 0.1 mg/dL (ref 0.0–0.3)
Total Bilirubin: 0.3 mg/dL (ref 0.2–1.2)
Total Protein: 6.5 g/dL (ref 6.0–8.3)

## 2016-09-18 LAB — TSH: TSH: 1.71 u[IU]/mL (ref 0.35–4.50)

## 2016-09-19 ENCOUNTER — Other Ambulatory Visit: Payer: 59

## 2016-10-03 ENCOUNTER — Ambulatory Visit (INDEPENDENT_AMBULATORY_CARE_PROVIDER_SITE_OTHER): Payer: 59 | Admitting: Internal Medicine

## 2016-10-03 ENCOUNTER — Encounter: Payer: Self-pay | Admitting: Internal Medicine

## 2016-10-03 VITALS — BP 122/78 | HR 86 | Temp 98.2°F | Ht 64.0 in | Wt 200.0 lb

## 2016-10-03 DIAGNOSIS — R7302 Impaired glucose tolerance (oral): Secondary | ICD-10-CM

## 2016-10-03 DIAGNOSIS — Z6379 Other stressful life events affecting family and household: Secondary | ICD-10-CM

## 2016-10-03 DIAGNOSIS — I10 Essential (primary) hypertension: Secondary | ICD-10-CM

## 2016-10-03 DIAGNOSIS — Z0001 Encounter for general adult medical examination with abnormal findings: Secondary | ICD-10-CM

## 2016-10-03 DIAGNOSIS — Z0000 Encounter for general adult medical examination without abnormal findings: Secondary | ICD-10-CM

## 2016-10-03 DIAGNOSIS — E785 Hyperlipidemia, unspecified: Secondary | ICD-10-CM

## 2016-10-03 MED ORDER — ATORVASTATIN CALCIUM 20 MG PO TABS: 20.0000 mg | ORAL_TABLET | Freq: Every day | ORAL | 3 refills | Status: DC

## 2016-10-03 MED ORDER — LORAZEPAM 0.5 MG PO TABS: 0.5000 mg | ORAL_TABLET | Freq: Two times a day (BID) | ORAL | 1 refills | Status: DC | PRN

## 2016-10-03 NOTE — Progress Notes (Signed)
Subjective:    Patient ID: Vanessa Price, female    DOB: 29-Apr-1955, 61 y.o.   MRN: [id number]  HPI  Wt Readings from Last 3 Encounters:  10/03/16 200 lb (90.7 kg)  10/02/15 201 lb (91.2 kg)  06/21/15 199 lb (90.3 kg)    BP Readings from Last 3 Encounters:  10/03/16 122/78  10/02/15 (!) 162/90  06/21/15 (!) 69/21   61 year old patient who is seen today for a preventive health examination. Medical problems include exogenous obesity, essential hypertension and impaired glucose tolerance.  She has a history of dyslipidemia.  She has been on statin therapy in the past, but this was self discontinued due toconstipation.  Issues She has been under considerable situational stress due to the poor health of a brother with lung cancer.  She is primary caregiver.  Laboratory studies reviewed No current medications Last colonoscopy 2009 Patient does have annual eye examinations and mammograms.  Past Medical History:  Diagnosis Date  . ANXIETY 06/04/2009  . GERD 08/14/2008  . HYPERLIPIDEMIA 09/14/2006  . HYPERTENSION 09/14/2006  . Impaired glucose tolerance   . MYOCARDIAL PERFUSION SCAN, WITH STRESS TEST, ABNORMAL 10/19/2008  . PREMATURE VENTRICULAR CONTRACTIONS 08/14/2008     Social History   Social History  . Marital status: Married    Spouse name: N/A  . Number of children: N/A  . Years of education: N/A   Occupational History  . Not on file.   Social History Main Topics  . Smoking status: Former Smoker    Quit date: 03/24/1993  . Smokeless tobacco: Never Used  . Alcohol use 0.6 oz/week    1 Standard drinks or equivalent per week     Comment: rare  . Drug use: No  . Sexual activity: Yes    Birth control/ protection: Surgical     Comment: HYst-1st intercourse 61 yo-More than 5 partners   Other Topics Concern  . Not on file   Social History Narrative  . No narrative on file    Past Surgical History:  Procedure Laterality Date  . ABDOMINAL HYSTERECTOMY   1999   menorrhagia  . CESAREAN SECTION      Family History  Problem Relation Age of Onset  . Arthritis Mother   . Diabetes Mother   . Heart disease Mother   . Mental illness Father     Allergies  Allergen Reactions  . Lisinopril     REACTION: cough  . Risperidone     REACTION: unsure of dose; pt describes reaction of bad cough    Current Outpatient Prescriptions on File Prior to Visit  Medication Sig Dispense Refill  . Multiple Vitamin (MULTIVITAMIN) tablet Take 1 tablet by mouth daily.     No current facility-administered medications on file prior to visit.     BP 122/78 (BP Location: Left Arm, Patient Position: Sitting, Cuff Size: Normal)   Pulse 86   Temp 98.2 F (36.8 C) (Oral)   Ht 5\' 4"  (1.626 m)   Wt 200 lb (90.7 kg)   SpO2 98%   BMI 34.33 kg/m     Review of Systems  Constitutional: Negative.   HENT: Negative for congestion, dental problem, hearing loss, rhinorrhea, sinus pressure, sore throat and tinnitus.   Eyes: Negative for pain, discharge and visual disturbance.  Respiratory: Negative for cough and shortness of breath.   Cardiovascular: Negative for chest pain, palpitations and leg swelling.  Gastrointestinal: Negative for abdominal distention, abdominal pain, blood in stool, constipation, diarrhea, nausea and vomiting.  Genitourinary: Negative for difficulty urinating, dysuria, flank pain, frequency, hematuria, pelvic pain, urgency, vaginal bleeding, vaginal discharge and vaginal pain.  Musculoskeletal: Negative for arthralgias, gait problem and joint swelling.  Skin: Negative for rash.  Neurological: Negative for dizziness, syncope, speech difficulty, weakness, numbness and headaches.  Hematological: Negative for adenopathy.  Psychiatric/Behavioral: Negative for agitation, behavioral problems and dysphoric mood. The patient is nervous/anxious.        Objective:   Physical Exam  Constitutional: She is oriented to person, place, and time. She  appears well-developed and well-nourished.  Repeat blood pressure 140/90 Weight 200 pounds  HENT:  Head: Normocephalic and atraumatic.  Right Ear: External ear normal.  Left Ear: External ear normal.  Mouth/Throat: Oropharynx is clear and moist.  Eyes: Conjunctivae and EOM are normal.  Neck: Normal range of motion. Neck supple. No JVD present. Thyromegaly present.  Cardiovascular: Normal rate, regular rhythm, normal heart sounds and intact distal pulses.   No murmur heard. Pulmonary/Chest: Effort normal and breath sounds normal. She has no wheezes. She has no rales.  Abdominal: Soft. Bowel sounds are normal. She exhibits no distension and no mass. There is no tenderness. There is no rebound and no guarding.  Genitourinary: Vagina normal.  Musculoskeletal: Normal range of motion. She exhibits no edema or tenderness.  Neurological: She is alert and oriented to person, place, and time. She has normal reflexes. No cranial nerve deficit. She exhibits normal muscle tone. Coordination normal.  Skin: Skin is warm and dry. No rash noted.  Psychiatric: She has a normal mood and affect. Her behavior is normal.          Assessment & Plan:   Preventive health exam Essential hypertension.  Presently off medications.  Weight loss encouraged.  Will place on DASH diet Impaired glucose tolerance Dyslipidemia/ Framingham risk, 11%.  Will place on low intensity statin therapy Situational stress.  Patient request a refill of lorazepam, which was helpful.  A number of years ago  Home blood pressure monitoring.  Encouraged Weight loss encouraged  Follow-up 6 months  Sarena Jezek Pilar Plate

## 2016-10-03 NOTE — Patient Instructions (Addendum)
WE NOW OFFER   [facility] FAST TRACK!!!  SAME DAY Appointments for ACUTE CARE  Such as: Sprains, Injuries, cuts, abrasions, rashes, muscle pain, joint pain, back pain Colds, flu, sore throats, headache, allergies, cough, fever  Ear pain, sinus and eye infections Abdominal pain, nausea, vomiting, diarrhea, upset stomach Animal/insect bites  3 Easy Ways to Schedule: Walk-In Scheduling Call in scheduling Mychart Sign-up: [email or web address]  Limit your sodium (Salt) intake    It is important that you exercise regularly, at least 20 minutes 3 to 4 times per week.  If you develop chest pain or shortness of breath seek  medical attention.  You need to lose weight.  Consider a lower calorie diet and regular exercise.         DASH Eating Plan DASH stands for "Dietary Approaches to Stop Hypertension." The DASH eating plan is a healthy eating plan that has been shown to reduce high blood pressure (hypertension). It may also reduce your risk for type 2 diabetes, heart disease, and stroke. The DASH eating plan may also help with weight loss. What are tips for following this plan? General guidelines  Avoid eating more than 2,300 mg (milligrams) of salt (sodium) a day. If you have hypertension, you may need to reduce your sodium intake to 1,500 mg a day.  Limit alcohol intake to no more than 1 drink a day for nonpregnant women and 2 drinks a day for men. One drink equals 12 oz of beer, 5 oz of wine, or 1 oz of hard liquor.  Work with your health care provider to maintain a healthy body weight or to lose weight. Ask what an ideal weight is for you.  Get at least 30 minutes of exercise that causes your heart to beat faster (aerobic exercise) most days of the week. Activities may include walking, swimming, or biking.  Work with your health care provider or diet and nutrition specialist (dietitian) to adjust your eating plan to your individual calorie needs. Reading  food labels  Check food labels for the amount of sodium per serving. Choose foods with less than 5 percent of the Daily Value of sodium. Generally, foods with less than 300 mg of sodium per serving fit into this eating plan.  To find whole grains, look for the word "whole" as the first word in the ingredient list. Shopping  Buy products labeled as "low-sodium" or "no salt added."  Buy fresh foods. Avoid canned foods and premade or frozen meals. Cooking  Avoid adding salt when cooking. Use salt-free seasonings or herbs instead of table salt or sea salt. Check with your health care provider or pharmacist before using salt substitutes.  Do not fry foods. Cook foods using healthy methods such as baking, boiling, grilling, and broiling instead.  Cook with heart-healthy oils, such as olive, canola, soybean, or sunflower oil. Meal planning   Eat a balanced diet that includes: ? 5 or more servings of fruits and vegetables each day. At each meal, try to fill half of your plate with fruits and vegetables. ? Up to 6-8 servings of whole grains each day. ? Less than 6 oz of lean meat, poultry, or fish each day. A 3-oz serving of meat is about the same size as a deck of cards. One egg equals 1 oz. ? 2 servings of low-fat dairy each day. ? A serving of nuts, seeds, or beans 5 times each week. ? Heart-healthy fats. Healthy fats called Omega-3 fatty acids are found in foods such as  flaxseeds and coldwater fish, like sardines, salmon, and mackerel.  Limit how much you eat of the following: ? Canned or prepackaged foods. ? Food that is high in trans fat, such as fried foods. ? Food that is high in saturated fat, such as fatty meat. ? Sweets, desserts, sugary drinks, and other foods with added sugar. ? Full-fat dairy products.  Do not salt foods before eating.  Try to eat at least 2 vegetarian meals each week.  Eat more home-cooked food and less restaurant, buffet, and fast food.  When eating at  a restaurant, ask that your food be prepared with less salt or no salt, if possible. What foods are recommended? The items listed may not be a complete list. Talk with your dietitian about what dietary choices are best for you. Grains Whole-grain or whole-wheat bread. Whole-grain or whole-wheat pasta. Brown rice. [place]row. Bulgur. Whole-grain and low-sodium cereals. Pita bread. Low-fat, low-sodium crackers. Whole-wheat flour tortillas. Vegetables Fresh or frozen vegetables (raw, steamed, roasted, or grilled). Low-sodium or reduced-sodium tomato and vegetable juice. Low-sodium or reduced-sodium tomato sauce and tomato paste. Low-sodium or reduced-sodium canned vegetables. Fruits All fresh, dried, or frozen fruit. Canned fruit in natural juice (without added sugar). Meat and other protein foods Skinless chicken or [place]. Ground chicken or [place]. Pork with fat trimmed off. Fish and seafood. Egg whites. Dried beans, peas, or lentils. Unsalted nuts, nut butters, and seeds. Unsalted canned beans. Lean cuts of beef with fat trimmed off. Low-sodium, lean deli meat. Dairy Low-fat (1%) or fat-free (skim) milk. Fat-free, low-fat, or reduced-fat cheeses. Nonfat, low-sodium ricotta or cottage cheese. Low-fat or nonfat yogurt. Low-fat, low-sodium cheese. Fats and oils Soft margarine without trans fats. Vegetable oil. Low-fat, reduced-fat, or light mayonnaise and salad dressings (reduced-sodium). Canola, safflower, olive, soybean, and sunflower oils. Avocado. Seasoning and other foods Herbs. Spices. Seasoning mixes without salt. Unsalted popcorn and pretzels. Fat-free sweets. What foods are not recommended? The items listed may not be a complete list. Talk with your dietitian about what dietary choices are best for you. Grains Baked goods made with fat, such as croissants, muffins, or some breads. Dry pasta or rice meal packs. Vegetables Creamed or fried vegetables. Vegetables in a cheese sauce.  Regular canned vegetables (not low-sodium or reduced-sodium). Regular canned tomato sauce and paste (not low-sodium or reduced-sodium). Regular tomato and vegetable juice (not low-sodium or reduced-sodium). Angie Fava. Olives. Fruits Canned fruit in a light or heavy syrup. Fried fruit. Fruit in cream or butter sauce. Meat and other protein foods Fatty cuts of meat. Ribs. Fried meat. Berniece Salines. Sausage. Bologna and other processed lunch meats. Salami. Fatback. Hotdogs. Bratwurst. Salted nuts and seeds. Canned beans with added salt. Canned or smoked fish. Whole eggs or egg yolks. Chicken or [place] with skin. Dairy Whole or 2% milk, cream, and half-and-half. Whole or full-fat cream cheese. Whole-fat or sweetened yogurt. Full-fat cheese. Nondairy creamers. Whipped toppings. Processed cheese and cheese spreads. Fats and oils Butter. Stick margarine. Lard. Shortening. Ghee. Bacon fat. Tropical oils, such as coconut, palm kernel, or palm oil. Seasoning and other foods Salted popcorn and pretzels. Onion salt, garlic salt, seasoned salt, table salt, and sea salt. Worcestershire sauce. Tartar sauce. Barbecue sauce. Teriyaki sauce. Soy sauce, including reduced-sodium. Steak sauce. Canned and packaged gravies. Fish sauce. Oyster sauce. Cocktail sauce. Horseradish that you find on the shelf. Ketchup. Mustard. Meat flavorings and tenderizers. Bouillon cubes. Hot sauce and Tabasco sauce. Premade or packaged marinades. Premade or packaged taco seasonings. Relishes. Regular salad dressings. Where  to find more information:  National Heart, Lung, and [place]: https://wilson-eaton.com/  American Heart Association: www.heart.org Summary  The DASH eating plan is a healthy eating plan that has been shown to reduce high blood pressure (hypertension). It may also reduce your risk for type 2 diabetes, heart disease, and stroke.  With the DASH eating plan, you should limit salt (sodium) intake to 2,300 mg a day. If you have  hypertension, you may need to reduce your sodium intake to 1,500 mg a day.  When on the DASH eating plan, aim to eat more fresh fruits and vegetables, whole grains, lean proteins, low-fat dairy, and heart-healthy fats.  Work with your health care provider or diet and nutrition specialist (dietitian) to adjust your eating plan to your individual calorie needs. This information is not intended to replace advice given to you by your health care provider. Make sure you discuss any questions you have with your health care provider. Document Released: 02/27/2011 Document Revised: 03/03/2016 Document Reviewed: 03/03/2016 Elsevier Interactive Patient Education  2017 [employer].

## 2016-12-11 ENCOUNTER — Encounter: Payer: Self-pay | Admitting: Internal Medicine

## 2016-12-29 ENCOUNTER — Ambulatory Visit (INDEPENDENT_AMBULATORY_CARE_PROVIDER_SITE_OTHER): Payer: 59

## 2016-12-29 ENCOUNTER — Encounter (INDEPENDENT_AMBULATORY_CARE_PROVIDER_SITE_OTHER): Payer: Self-pay | Admitting: Orthopedic Surgery

## 2016-12-29 ENCOUNTER — Ambulatory Visit (INDEPENDENT_AMBULATORY_CARE_PROVIDER_SITE_OTHER): Payer: 59 | Admitting: Orthopedic Surgery

## 2016-12-29 DIAGNOSIS — M545 Low back pain: Secondary | ICD-10-CM

## 2016-12-29 MED ORDER — PREDNISONE 10 MG PO TABS: 20.0000 mg | ORAL_TABLET | Freq: Every day | ORAL | 0 refills | Status: DC

## 2016-12-29 NOTE — Progress Notes (Signed)
Office Visit Note   Patient: Vanessa Price           Date of Birth: 18-Feb-1956           MRN: [id number] Visit Date: 12/29/2016              Requested by: Marletta Lor, MD [address] [place], [place] [address] PCP: Marletta Lor, MD  Chief Complaint  Patient presents with  . Lower Back - Pain  . Right Leg - Pain      HPI: Patient is a 61 year old woman who presents with over 1 month history of radicular pain from the right buttocks down the lateral aspect of the right leg to the right foot. She has tried Aleve with only temporary relief. Patient states she sits for prolonged periods at work. Occasionally she has had sharp pain that goes down to her heel. Patient states the pain is worse with walking.  Assessment & Plan: Visit Diagnoses:  1. Low back pain, unspecified back pain laterality, unspecified chronicity, with sciatica presence unspecified   2. Right low back pain, unspecified chronicity, with sciatica presence unspecified     Plan: We will start her on a low-dose prednisone follow-up in 4 weeks. Discussed that if she is not better at follow-up we'll obtain an MRI scan for evaluation for epidural steroid injection. She will decrease from 2 pills in the morning to 1 pill as symptoms resolve. Discussed the importance of weaning off the medicine.  Follow-Up Instructions: Return in about 4 weeks (around 01/26/2017).   Ortho Exam  Patient is alert, oriented, no adenopathy, well-dressed, normal affect, normal respiratory effort. Examination patient has a normal gait she has no abductor lurch. There is no pain with range of motion of the hip near or ankle. She has a negative straight leg raise. She has good motor strength in the right lower extremity with no focal motor deficits.  Imaging: Xr Lumbar Spine 2-3 Views  Result Date: 12/29/2016 Two-view radiographs of lumbar spine shows bone spurs throughout the lumbar spine. There is a  degenerative scoliosis as well as calcification of the aorta without aneurysm. Disc space narrowing through the lower lumbar spine.  No images are attached to the encounter.  Labs: Lab Results  Component Value Date   LABORGA KLEBSIELLA PNEUMONIAE 05/17/2015    Orders:  Orders Placed This Encounter  Procedures  . XR Lumbar Spine 2-3 Views   Meds ordered this encounter  Medications  . predniSONE (DELTASONE) 10 MG tablet    Sig: Take 2 tablets (20 mg total) by mouth daily with breakfast.    Dispense:  60 tablet    Refill:  0     Procedures: No procedures performed  Clinical Data: No additional findings.  ROS:  All other systems negative, except as noted in the HPI. Review of Systems  Objective: Vital Signs: There were no vitals taken for this visit.  Specialty Comments:  No specialty comments available.  PMFS History: Patient Active Problem List   Diagnosis Date Noted  . Impaired glucose tolerance 10/02/2015  . ANXIETY 06/04/2009  . MYOCARDIAL PERFUSION SCAN, WITH STRESS TEST, ABNORMAL 10/19/2008  . PREMATURE VENTRICULAR CONTRACTIONS 08/14/2008  . GERD 08/14/2008  . Goiter 01/04/2008  . Dyslipidemia 09/14/2006  . Essential hypertension 09/14/2006   Past Medical History:  Diagnosis Date  . ANXIETY 06/04/2009  . GERD 08/14/2008  . HYPERLIPIDEMIA 09/14/2006  . HYPERTENSION 09/14/2006  . Impaired glucose tolerance   . MYOCARDIAL PERFUSION SCAN, WITH  STRESS TEST, ABNORMAL 10/19/2008  . PREMATURE VENTRICULAR CONTRACTIONS 08/14/2008    Family History  Problem Relation Age of Onset  . Arthritis Mother   . Diabetes Mother   . Heart disease Mother   . Mental illness Father     Past Surgical History:  Procedure Laterality Date  . ABDOMINAL HYSTERECTOMY  1999   menorrhagia  . CESAREAN SECTION     Social History   Occupational History  . Not on file.   Social History Main Topics  . Smoking status: Former Smoker    Quit date: 03/24/1993  . Smokeless tobacco:  Never Used  . Alcohol use 0.6 oz/week    1 Standard drinks or equivalent per week     Comment: rare  . Drug use: No  . Sexual activity: Yes    Birth control/ protection: Surgical     Comment: HYst-1st intercourse 61 yo-More than 5 partners

## 2017-01-26 ENCOUNTER — Ambulatory Visit (INDEPENDENT_AMBULATORY_CARE_PROVIDER_SITE_OTHER): Payer: 59 | Admitting: Orthopedic Surgery

## 2017-01-26 ENCOUNTER — Encounter (INDEPENDENT_AMBULATORY_CARE_PROVIDER_SITE_OTHER): Payer: Self-pay | Admitting: Orthopedic Surgery

## 2017-01-26 VITALS — Ht 64.0 in | Wt 200.0 lb

## 2017-01-26 DIAGNOSIS — M545 Low back pain: Secondary | ICD-10-CM | POA: Diagnosis not present

## 2017-01-26 MED ORDER — PREDNISONE 10 MG PO TABS: 20.0000 mg | ORAL_TABLET | Freq: Every day | ORAL | 0 refills | Status: DC

## 2017-01-26 NOTE — Progress Notes (Signed)
Office Visit Note   Patient: Vanessa Price           Date of Birth: 11/08/1955           MRN: [id number] Visit Date: 01/26/2017              Requested by: Marletta Lor, MD [address] [place], [place] [address] PCP: Marletta Lor, MD  Chief Complaint  Patient presents with  . Lower Back - Follow-up    RLE rad pain to shin. 4 week f/u finished pred taper.       HPI: Patient is a 61 year old woman who presents for follow-up status post lower back pain with right lower extremity radicular symptoms to the right thigh and right lateral calf.  Patient states that her pain has subsided she states she does have pain with increased walking but otherwise she states she is pain-free.  Assessment & Plan: Visit Diagnoses:  1. Right low back pain, unspecified chronicity, with sciatica presence unspecified     Plan: Patient is given a refill prescription for the prednisone recommended weaning down to 10 mg and then eventually weaning off.  Discussed the importance of not using nonsteroidals with the prednisone.  Discussed that if her symptoms worsen we could consider an MRI scan with the potential for epidural steroid injection.  Follow-Up Instructions: Return if symptoms worsen or fail to improve.   Ortho Exam  Patient is alert, oriented, no adenopathy, well-dressed, normal affect, normal respiratory effort. Examination patient has a normal gait.  She has a negative straight leg raise there is no focal motor weakness either lower extremity.  Imaging: No results found. No images are attached to the encounter.  Labs: Lab Results  Component Value Date   LABORGA KLEBSIELLA PNEUMONIAE 05/17/2015    Orders:  No orders of the defined types were placed in this encounter.  Meds ordered this encounter  Medications  . predniSONE (DELTASONE) 10 MG tablet    Sig: Take 2 tablets (20 mg total) daily with breakfast by mouth.    Dispense:  60 tablet   Refill:  0     Procedures: No procedures performed  Clinical Data: No additional findings.  ROS:  All other systems negative, except as noted in the HPI. Review of Systems  Objective: Vital Signs: Ht 5\' 4"  (1.626 m)   Wt 200 lb (90.7 kg)   BMI 34.33 kg/m   Specialty Comments:  No specialty comments available.  PMFS History: Patient Active Problem List   Diagnosis Date Noted  . Impaired glucose tolerance 10/02/2015  . ANXIETY 06/04/2009  . MYOCARDIAL PERFUSION SCAN, WITH STRESS TEST, ABNORMAL 10/19/2008  . PREMATURE VENTRICULAR CONTRACTIONS 08/14/2008  . GERD 08/14/2008  . Goiter 01/04/2008  . Dyslipidemia 09/14/2006  . Essential hypertension 09/14/2006   Past Medical History:  Diagnosis Date  . ANXIETY 06/04/2009  . GERD 08/14/2008  . HYPERLIPIDEMIA 09/14/2006  . HYPERTENSION 09/14/2006  . Impaired glucose tolerance   . MYOCARDIAL PERFUSION SCAN, WITH STRESS TEST, ABNORMAL 10/19/2008  . PREMATURE VENTRICULAR CONTRACTIONS 08/14/2008    Family History  Problem Relation Age of Onset  . Arthritis Mother   . Diabetes Mother   . Heart disease Mother   . Mental illness Father     Past Surgical History:  Procedure Laterality Date  . ABDOMINAL HYSTERECTOMY  1999   menorrhagia  . CESAREAN SECTION     Social History   Occupational History  . Not on file  Tobacco Use  .  Smoking status: Former Smoker    Last attempt to quit: 03/24/1993    Years since quitting: 23.8  . Smokeless tobacco: Never Used  Substance and Sexual Activity  . Alcohol use: Yes    Alcohol/week: 0.6 oz    Types: 1 Standard drinks or equivalent per week    Comment: rare  . Drug use: No  . Sexual activity: Yes    Birth control/protection: Surgical    Comment: HYst-1st intercourse 61 yo-More than 5 partners

## 2017-03-26 ENCOUNTER — Ambulatory Visit (INDEPENDENT_AMBULATORY_CARE_PROVIDER_SITE_OTHER): Payer: 59 | Admitting: Orthopedic Surgery

## 2017-06-09 DIAGNOSIS — Z1231 Encounter for screening mammogram for malignant neoplasm of breast: Secondary | ICD-10-CM | POA: Diagnosis not present

## 2017-10-08 ENCOUNTER — Encounter: Payer: 59 | Admitting: Internal Medicine

## 2017-10-13 ENCOUNTER — Ambulatory Visit (INDEPENDENT_AMBULATORY_CARE_PROVIDER_SITE_OTHER): Payer: 59 | Admitting: Internal Medicine

## 2017-10-13 ENCOUNTER — Encounter: Payer: Self-pay | Admitting: Internal Medicine

## 2017-10-13 VITALS — BP 142/90 | HR 86 | Temp 98.2°F | Ht 64.5 in | Wt 194.4 lb

## 2017-10-13 DIAGNOSIS — E049 Nontoxic goiter, unspecified: Secondary | ICD-10-CM

## 2017-10-13 DIAGNOSIS — E785 Hyperlipidemia, unspecified: Secondary | ICD-10-CM

## 2017-10-13 DIAGNOSIS — Z0000 Encounter for general adult medical examination without abnormal findings: Secondary | ICD-10-CM | POA: Diagnosis not present

## 2017-10-13 DIAGNOSIS — I10 Essential (primary) hypertension: Secondary | ICD-10-CM

## 2017-10-13 DIAGNOSIS — R7302 Impaired glucose tolerance (oral): Secondary | ICD-10-CM

## 2017-10-13 LAB — CBC WITH DIFFERENTIAL/PLATELET
Basophils Absolute: 0 10*3/uL (ref 0.0–0.1)
Basophils Relative: 0.7 % (ref 0.0–3.0)
Eosinophils Absolute: 0.2 10*3/uL (ref 0.0–0.7)
Eosinophils Relative: 3.7 % (ref 0.0–5.0)
HCT: 42.4 % (ref 36.0–46.0)
Hemoglobin: 14.2 g/dL (ref 12.0–15.0)
Lymphocytes Relative: 43.2 % (ref 12.0–46.0)
Lymphs Abs: 1.8 10*3/uL (ref 0.7–4.0)
MCHC: 33.3 g/dL (ref 30.0–36.0)
MCV: 83.8 fl (ref 78.0–100.0)
Monocytes Absolute: 0.2 10*3/uL (ref 0.1–1.0)
Monocytes Relative: 5.8 % (ref 3.0–12.0)
Neutro Abs: 2 10*3/uL (ref 1.4–7.7)
Neutrophils Relative %: 46.6 % (ref 43.0–77.0)
Platelets: 224 10*3/uL (ref 150.0–400.0)
RBC: 5.07 Mil/uL (ref 3.87–5.11)
RDW: 14.7 % (ref 11.5–15.5)
WBC: 4.2 10*3/uL (ref 4.0–10.5)

## 2017-10-13 LAB — LIPID PANEL
Cholesterol: 298 mg/dL — ABNORMAL HIGH (ref 0–200)
HDL: 37.9 mg/dL — ABNORMAL LOW (ref >39.00)
NonHDL: 260.51
Total CHOL/HDL Ratio: 8
Triglycerides: 313 mg/dL — ABNORMAL HIGH (ref 0.0–149.0)
VLDL: 62.6 mg/dL — ABNORMAL HIGH (ref 0.0–40.0)

## 2017-10-13 LAB — COMPREHENSIVE METABOLIC PANEL
ALT: 17 U/L (ref 0–35)
AST: 13 U/L (ref 0–37)
Albumin: 4.5 g/dL (ref 3.5–5.2)
Alkaline Phosphatase: 75 U/L (ref 39–117)
BUN: 14 mg/dL (ref 6–23)
CO2: 26 mEq/L (ref 19–32)
Calcium: 9.4 mg/dL (ref 8.4–10.5)
Chloride: 106 mEq/L (ref 96–112)
Creatinine, Ser: 0.76 mg/dL (ref 0.40–1.20)
GFR: 98.55 mL/min (ref >60.00)
Glucose, Bld: 107 mg/dL — ABNORMAL HIGH (ref 70–99)
Potassium: 4.2 mEq/L (ref 3.5–5.1)
Sodium: 141 mEq/L (ref 135–145)
Total Bilirubin: 0.3 mg/dL (ref 0.2–1.2)
Total Protein: 7.1 g/dL (ref 6.0–8.3)

## 2017-10-13 LAB — TSH: TSH: 1.87 u[IU]/mL (ref 0.35–4.50)

## 2017-10-13 LAB — LDL CHOLESTEROL, DIRECT: Direct LDL: 182 mg/dL

## 2017-10-13 NOTE — Progress Notes (Signed)
Subjective:    Patient ID: Vanessa Price, female    DOB: 08/17/1955, 62 y.o.   MRN: [id number]  HPI  62 year old patient who is seen today for a preventive health examination She takes no chronic medications. She does have a history of dyslipidemia but has been intolerant of statin therapy.  She states that years ago she had myalgias.  Last year she was given a new prescription for low-dose atorvastatin which she never took due to fear of side effects.  She has a history of borderline hypertension and impaired glucose tolerance as well as obesity. She has a long history of goiter.  Social history.  She continues to work 30 hours/week non-smoker Family history.  Father died at age 4 probable esophageal cancer.  Mother died at 55 with a history of congestive heart failure and diabetes.  One brother died one year ago with lung cancer  Last colonoscopy 2009 Does have annual eye examinations and mammograms  Past Medical History:  Diagnosis Date  . ANXIETY 06/04/2009  . GERD 08/14/2008  . HYPERLIPIDEMIA 09/14/2006  . HYPERTENSION 09/14/2006  . Impaired glucose tolerance   . MYOCARDIAL PERFUSION SCAN, WITH STRESS TEST, ABNORMAL 10/19/2008  . PREMATURE VENTRICULAR CONTRACTIONS 08/14/2008     Social History   Socioeconomic History  . Marital status: Married    Spouse name: Not on file  . Number of children: Not on file  . Years of education: Not on file  . Highest education level: Not on file  Occupational History  . Not on file  Social Needs  . Financial resource strain: Not on file  . Food insecurity:    Worry: Not on file    Inability: Not on file  . Transportation needs:    Medical: Not on file    Non-medical: Not on file  Tobacco Use  . Smoking status: Former Smoker    Last attempt to quit: 03/24/1993    Years since quitting: 24.5  . Smokeless tobacco: Never Used  Substance and Sexual Activity  . Alcohol use: Yes    Alcohol/week: 0.6 oz    Types: 1 Standard  drinks or equivalent per week    Comment: rare  . Drug use: No  . Sexual activity: Yes    Birth control/protection: Surgical    Comment: HYst-1st intercourse 62 yo-More than 5 partners  Lifestyle  . Physical activity:    Days per week: Not on file    Minutes per session: Not on file  . Stress: Not on file  Relationships  . Social connections:    Talks on phone: Not on file    Gets together: Not on file    Attends religious service: Not on file    Active member of club or organization: Not on file    Attends meetings of clubs or organizations: Not on file    Relationship status: Not on file  . Intimate partner violence:    Fear of current or ex partner: Not on file    Emotionally abused: Not on file    Physically abused: Not on file    Forced sexual activity: Not on file  Other Topics Concern  . Not on file  Social History Narrative  . Not on file    Past Surgical History:  Procedure Laterality Date  . ABDOMINAL HYSTERECTOMY  1999   menorrhagia  . CESAREAN SECTION      Family History  Problem Relation Age of Onset  . Arthritis Mother   . Diabetes  Mother   . Heart disease Mother   . Mental illness Father     Allergies  Allergen Reactions  . Lisinopril     REACTION: cough  . Risperidone     REACTION: unsure of dose; pt describes reaction of bad cough    Current Outpatient Medications on File Prior to Visit  Medication Sig Dispense Refill  . atorvastatin (LIPITOR) 20 MG tablet Take 1 tablet (20 mg total) by mouth daily. (Patient not taking: Reported on 10/13/2017) 90 tablet 3   No current facility-administered medications on file prior to visit.     BP (!) 142/90 (BP Location: Left Arm, Patient Position: Sitting, Cuff Size: Normal)   Pulse 86   Temp 98.2 F (36.8 C) (Oral)   Ht 5' 4.5" (1.638 m)   Wt 194 lb 6.4 oz (88.2 kg)   SpO2 97%   BMI 32.85 kg/m      Review of Systems  Constitutional: Negative.   HENT: Negative for congestion, dental  problem, hearing loss, rhinorrhea, sinus pressure, sore throat and tinnitus.   Eyes: Negative for pain, discharge and visual disturbance.  Respiratory: Negative for cough and shortness of breath.   Cardiovascular: Negative for chest pain, palpitations and leg swelling.  Gastrointestinal: Negative for abdominal distention, abdominal pain, blood in stool, constipation, diarrhea, nausea and vomiting.  Genitourinary: Negative for difficulty urinating, dysuria, flank pain, frequency, hematuria, pelvic pain, urgency, vaginal bleeding, vaginal discharge and vaginal pain.  Musculoskeletal: Negative for arthralgias, gait problem and joint swelling.       Right shoulder pain  Skin: Negative for rash.  Neurological: Negative for dizziness, syncope, speech difficulty, weakness, numbness and headaches.  Hematological: Negative for adenopathy.  Psychiatric/Behavioral: Negative for agitation, behavioral problems and dysphoric mood. The patient is not nervous/anxious.        Objective:   Physical Exam  Constitutional: She is oriented to person, place, and time. She appears well-developed and well-nourished.  Blood pressure on arrival 140/90 Lowest blood pressure 136/70  Weight 194  HENT:  Head: Normocephalic and atraumatic.  Right Ear: External ear normal.  Left Ear: External ear normal.  Mouth/Throat: Oropharynx is clear and moist.  Eyes: Conjunctivae and EOM are normal.  Neck: Normal range of motion. Neck supple. No JVD present. Thyromegaly present.  Cardiovascular: Normal rate, regular rhythm, normal heart sounds and intact distal pulses.  No murmur heard. Pulmonary/Chest: Effort normal and breath sounds normal. She has no wheezes. She has no rales.  Abdominal: Soft. Bowel sounds are normal. She exhibits no distension and no mass. There is no tenderness. There is no rebound and no guarding.  Genitourinary: Vagina normal.  Musculoskeletal: Normal range of motion. She exhibits no edema or  tenderness.  Neurological: She is alert and oriented to person, place, and time. She has normal reflexes. She displays normal reflexes. No cranial nerve deficit. She exhibits normal muscle tone. Coordination normal.  Skin: Skin is warm and dry. No rash noted.  Psychiatric: She has a normal mood and affect. Her behavior is normal.          Assessment & Plan:   Preventive health examination Impaired glucose tolerance. Goiter.  Will review TSH Borderline hypertension.  Nonpharmacologic issues stressed we will continue to monitor Dyslipidemia.  Patient has not tolerated statins well in the past.  Framingham score today 5%.  Last year was calculated at 11% and patient placed on low-dose statin therapy which she never took.  Will review lipid profile  Scheduled 10-year colonoscopy Review lab  Ongoing weight loss and exercise encouraged  Follow-up 1 year  Marletta Lor

## 2017-10-13 NOTE — Patient Instructions (Addendum)
Health Maintenance for Postmenopausal Women Menopause is a normal process in which your reproductive ability comes to an end. This process happens gradually over a span of months to years, usually between the ages of 22 and 9. Menopause is complete when you have missed 12 consecutive menstrual periods. It is important to talk with your health care provider about some of the most common conditions that affect postmenopausal women, such as heart disease, cancer, and bone loss (osteoporosis). Adopting a healthy lifestyle and getting preventive care can help to promote your health and wellness. Those actions can also lower your chances of developing some of these common conditions. What should I know about menopause? During menopause, you may experience a number of symptoms, such as:  Moderate-to-severe hot flashes.  Night sweats.  Decrease in sex drive.  Mood swings.  Headaches.  Tiredness.  Irritability.  Memory problems.  Insomnia.  Choosing to treat or not to treat menopausal changes is an individual decision that you make with your health care provider. What should I know about hormone replacement therapy and supplements? Hormone therapy products are effective for treating symptoms that are associated with menopause, such as hot flashes and night sweats. Hormone replacement carries certain risks, especially as you become older. If you are thinking about using estrogen or estrogen with progestin treatments, discuss the benefits and risks with your health care provider. What should I know about heart disease and stroke? Heart disease, heart attack, and stroke become more likely as you age. This may be due, in part, to the hormonal changes that your body experiences during menopause. These can affect how your body processes dietary fats, triglycerides, and cholesterol. Heart attack and stroke are both medical emergencies. There are many things that you can do to help prevent heart disease  and stroke:  Have your blood pressure checked at least every 1-2 years. High blood pressure causes heart disease and increases the risk of stroke.  If you are 53-22 years old, ask your health care provider if you should take aspirin to prevent a heart attack or a stroke.  Do not use any tobacco products, including cigarettes, chewing tobacco, or electronic cigarettes. If you need help quitting, ask your health care provider.  It is important to eat a healthy diet and maintain a healthy weight. ? Be sure to include plenty of vegetables, fruits, low-fat dairy products, and lean protein. ? Avoid eating foods that are high in solid fats, added sugars, or salt (sodium).  Get regular exercise. This is one of the most important things that you can do for your health. ? Try to exercise for at least 150 minutes each week. The type of exercise that you do should increase your heart rate and make you sweat. This is known as moderate-intensity exercise. ? Try to do strengthening exercises at least twice each week. Do these in addition to the moderate-intensity exercise.  Know your numbers.Ask your health care provider to check your cholesterol and your blood glucose. Continue to have your blood tested as directed by your health care provider.  What should I know about cancer screening? There are several types of cancer. Take the following steps to reduce your risk and to catch any cancer development as early as possible. Breast Cancer  Practice breast self-awareness. ? This means understanding how your breasts normally appear and feel. ? It also means doing regular breast self-exams. Let your health care provider know about any changes, no matter how small.  If you are 40  or older, have a clinician do a breast exam (clinical breast exam or CBE) every year. Depending on your age, family history, and medical history, it may be recommended that you also have a yearly breast X-ray (mammogram).  If you  have a family history of breast cancer, talk with your health care provider about genetic screening.  If you are at high risk for breast cancer, talk with your health care provider about having an MRI and a mammogram every year.  Breast cancer (BRCA) gene test is recommended for women who have family members with BRCA-related cancers. Results of the assessment will determine the need for genetic counseling and BRCA1 and for BRCA2 testing. BRCA-related cancers include these types: ? Breast. This occurs in males or females. ? Ovarian. ? Tubal. This may also be called fallopian tube cancer. ? Cancer of the abdominal or pelvic lining (peritoneal cancer). ? Prostate. ? Pancreatic.  Cervical, Uterine, and Ovarian Cancer Your health care provider may recommend that you be screened regularly for cancer of the pelvic organs. These include your ovaries, uterus, and vagina. This screening involves a pelvic exam, which includes checking for microscopic changes to the surface of your cervix (Pap test).  For women ages 21-65, health care providers may recommend a pelvic exam and a Pap test every three years. For women ages 62-65, they may recommend the Pap test and pelvic exam, combined with testing for human papilloma virus (HPV), every five years. Some types of HPV increase your risk of cervical cancer. Testing for HPV may also be done on women of any age who have unclear Pap test results.  Other health care providers may not recommend any screening for nonpregnant women who are considered low risk for pelvic cancer and have no symptoms. Ask your health care provider if a screening pelvic exam is right for you.  If you have had past treatment for cervical cancer or a condition that could lead to cancer, you need Pap tests and screening for cancer for at least 20 years after your treatment. If Pap tests have been discontinued for you, your risk factors (such as having a new sexual partner) need to be  reassessed to determine if you should start having screenings again. Some women have medical problems that increase the chance of getting cervical cancer. In these cases, your health care provider may recommend that you have screening and Pap tests more often.  If you have a family history of uterine cancer or ovarian cancer, talk with your health care provider about genetic screening.  If you have vaginal bleeding after reaching menopause, tell your health care provider.  There are currently no reliable tests available to screen for ovarian cancer.  Lung Cancer Lung cancer screening is recommended for adults 74-43 years old who are at high risk for lung cancer because of a history of smoking. A yearly low-dose CT scan of the lungs is recommended if you:  Currently smoke.  Have a history of at least 30 pack-years of smoking and you currently smoke or have quit within the past 15 years. A pack-year is smoking an average of one pack of cigarettes per day for one year.  Yearly screening should:  Continue until it has been 15 years since you quit.  Stop if you develop a health problem that would prevent you from having lung cancer treatment.  Colorectal Cancer  This type of cancer can be detected and can often be prevented.  Routine colorectal cancer screening usually begins at  age 42 and continues through age 45.  If you have risk factors for colon cancer, your health care provider may recommend that you be screened at an earlier age.  If you have a family history of colorectal cancer, talk with your health care provider about genetic screening.  Your health care provider may also recommend using home test kits to check for hidden blood in your stool.  A small camera at the end of a tube can be used to examine your colon directly (sigmoidoscopy or colonoscopy). This is done to check for the earliest forms of colorectal cancer.  Direct examination of the colon should be repeated every  5-10 years until age 71. However, if early forms of precancerous polyps or small growths are found or if you have a family history or genetic risk for colorectal cancer, you may need to be screened more often.  Skin Cancer  Check your skin from head to toe regularly.  Monitor any moles. Be sure to tell your health care provider: ? About any new moles or changes in moles, especially if there is a change in a mole's shape or color. ? If you have a mole that is larger than the size of a pencil eraser.  If any of your family members has a history of skin cancer, especially at a young age, talk with your health care provider about genetic screening.  Always use sunscreen. Apply sunscreen liberally and repeatedly throughout the day.  Whenever you are outside, protect yourself by wearing long sleeves, pants, a wide-brimmed hat, and sunglasses.  What should I know about osteoporosis? Osteoporosis is a condition in which bone destruction happens more quickly than new bone creation. After menopause, you may be at an increased risk for osteoporosis. To help prevent osteoporosis or the bone fractures that can happen because of osteoporosis, the following is recommended:  If you are 46-71 years old, get at least 1,000 mg of calcium and at least 600 mg of vitamin D per day.  If you are older than age 55 but younger than age 65, get at least 1,200 mg of calcium and at least 600 mg of vitamin D per day.  If you are older than age 54, get at least 1,200 mg of calcium and at least 800 mg of vitamin D per day.  Smoking and excessive alcohol intake increase the risk of osteoporosis. Eat foods that are rich in calcium and vitamin D, and do weight-bearing exercises several times each week as directed by your health care provider. What should I know about how menopause affects my mental health? Depression may occur at any age, but it is more common as you become older. Common symptoms of depression  include:  Low or sad mood.  Changes in sleep patterns.  Changes in appetite or eating patterns.  Feeling an overall lack of motivation or enjoyment of activities that you previously enjoyed.  Frequent crying spells.  Talk with your health care provider if you think that you are experiencing depression. What should I know about immunizations? It is important that you get and maintain your immunizations. These include:  Tetanus, diphtheria, and pertussis (Tdap) booster vaccine.  Influenza every year before the flu season begins.  Pneumonia vaccine.  Shingles vaccine.  Your health care provider may also recommend other immunizations. This information is not intended to replace advice given to you by your health care provider. Make sure you discuss any questions you have with your health care provider. Document Released: 05/02/2005  Document Revised: 09/28/2015 Document Reviewed: 12/12/2014 Elsevier Interactive Patient Education  2018 [place].  Shoulder Impingement Syndrome Rehab Ask your health care provider which exercises are safe for you. Do exercises exactly as told by your health care provider and adjust them as directed. It is normal to feel mild stretching, pulling, tightness, or discomfort as you do these exercises, but you should stop right away if you feel sudden pain or your pain gets worse.Do not begin these exercises until told by your health care provider. Stretching and range of motion exercise This exercise warms up your muscles and joints and improves the movement and flexibility of your shoulder. This exercise also helps to relieve pain and stiffness. Exercise A: Passive horizontal adduction  1. Sit or stand and pull your left / right elbow across your chest, toward your other shoulder. Stop when you feel a gentle stretch in the back of your shoulder and upper arm. ? Keep your arm at shoulder height. ? Keep your arm as close to your body as you comfortably  can. 2. Hold for __________ seconds. 3. Slowly return to the starting position. Repeat __________ times. Complete this exercise __________ times a day. Strengthening exercises These exercises build strength and endurance in your shoulder. Endurance is the ability to use your muscles for a long time, even after they get tired. Exercise B: External rotation, isometric 1. Stand or sit in a doorway, facing the door frame. 2. Bend your left / right elbow and place the back of your wrist against the door frame. Only your wrist should be touching the frame. Keep your upper arm at your side. 3. Gently press your wrist against the door frame, as if you are trying to push your arm away from your abdomen. ? Avoid shrugging your shoulder while you press your hand against the door frame. Keep your shoulder blade tucked down toward the middle of your back. 4. Hold for __________ seconds. 5. Slowly release the tension, and relax your muscles completely before you do the exercise again. Repeat __________ times. Complete this exercise __________ times a day. Exercise C: Internal rotation, isometric  1. Stand or sit in a doorway, facing the door frame. 2. Bend your left / right elbow and place the inside of your wrist against the door frame. Only your wrist should be touching the frame. Keep your upper arm at your side. 3. Gently press your wrist against the door frame, as if you are trying to push your arm toward your abdomen. ? Avoid shrugging your shoulder while you press your hand against the door frame. Keep your shoulder blade tucked down toward the middle of your back. 4. Hold for __________ seconds. 5. Slowly release the tension, and relax your muscles completely before you do the exercise again. Repeat __________ times. Complete this exercise __________ times a day. Exercise D: Scapular protraction, supine  1. Lie on your back on a firm surface. Hold a __________ weight in your left / right  hand. 2. Raise your left / right arm straight into the air so your hand is directly above your shoulder joint. 3. Push the weight into the air so your shoulder lifts off of the surface that you are lying on. Do not move your head, neck, or back. 4. Hold for __________ seconds. 5. Slowly return to the starting position. Let your muscles relax completely before you repeat this exercise. Repeat __________ times. Complete this exercise __________ times a day. Exercise E: Scapular retraction  1. Sit  in a stable chair without armrests, or stand. 2. Secure an exercise band to a stable object in front of you so the band is at shoulder height. 3. Hold one end of the exercise band in each hand. Your palms should face down. 4. Squeeze your shoulder blades together and move your elbows slightly behind you. Do not shrug your shoulders while you do this. 5. Hold for __________ seconds. 6. Slowly return to the starting position. Repeat __________ times. Complete this exercise __________ times a day. Exercise F: Shoulder extension  1. Sit in a stable chair without armrests, or stand. 2. Secure an exercise band to a stable object in front of you where the band is above shoulder height. 3. Hold one end of the exercise band in each hand. 4. Straighten your elbows and lift your hands up to shoulder height. 5. Squeeze your shoulder blades together and pull your hands down to the sides of your thighs. Stop when your hands are straight down by your sides. Do not let your hands go behind your body. 6. Hold for __________ seconds. 7. Slowly return to the starting position. Repeat __________ times. Complete this exercise __________ times a day. This information is not intended to replace advice given to you by your health care provider. Make sure you discuss any questions you have with your health care provider. Document Released: 03/10/2005 Document Revised: 11/15/2015 Document Reviewed: 02/10/2015 Elsevier  Interactive Patient Education  [employer].

## 2017-10-14 LAB — HEPATITIS C ANTIBODY
Hepatitis C Ab: NONREACTIVE
SIGNAL TO CUT-OFF: 0.01 (ref <1.00)

## 2018-01-20 ENCOUNTER — Encounter: Payer: Self-pay | Admitting: Internal Medicine

## 2018-02-09 NOTE — Progress Notes (Signed)
Established Patient Office Visit     CC/Reason for Visit: To establish care with me as her provider and for chronic comorbidity follow-up  HPI: Vanessa Price is a 62 y.o. female who is coming in today for the above mentioned reasons. She was seen in July 2019 for her annual physical. Past Medical History is significant for: HTN, HLD (not on statin due to myalgias), hypertriglyceridemia, IGT, obesity.  She has no complaints today.  She has a history of hypertension but has not been on blood pressure medications, blood pressure today is well controlled.  She however has an ambulatory blood pressure cuff and she states that her readings are as high as 160/100 at times.  Her LDL and triglycerides remain elevated, likely due to the fact that she is intolerant to statins due to myalgias.  She also has a history of impaired glucose tolerance and is due for an A1c today.   Past Medical/Surgical History: Past Medical History:  Diagnosis Date  . ANXIETY 06/04/2009  . GERD 08/14/2008  . HYPERLIPIDEMIA 09/14/2006  . HYPERTENSION 09/14/2006  . Impaired glucose tolerance   . MYOCARDIAL PERFUSION SCAN, WITH STRESS TEST, ABNORMAL 10/19/2008  . PREMATURE VENTRICULAR CONTRACTIONS 08/14/2008    Past Surgical History:  Procedure Laterality Date  . ABDOMINAL HYSTERECTOMY  1999   menorrhagia  . CESAREAN SECTION      Social History:  reports that she quit smoking about 24 years ago. She has never used smokeless tobacco. She reports that she drinks about 1.0 standard drinks of alcohol per week. She reports that she does not use drugs.  Allergies: Allergies  Allergen Reactions  . Lisinopril     REACTION: cough  . Risperidone     REACTION: unsure of dose; pt describes reaction of bad cough    Family History:  Family History  Problem Relation Age of Onset  . Arthritis Mother   . Diabetes Mother   . Heart disease Mother   . Mental illness Father      Current Outpatient  Medications:  .  fenofibrate (TRICOR) 145 MG tablet, Take 1 tablet (145 mg total) by mouth daily., Disp: 30 tablet, Rfl: 2  Review of Systems:  Constitutional: Denies fever, chills, diaphoresis, appetite change and fatigue.  HEENT: Denies photophobia, eye pain, redness, hearing loss, ear pain, congestion, sore throat, rhinorrhea, sneezing, mouth sores, trouble swallowing, neck pain, neck stiffness and tinnitus.   Respiratory: Denies SOB, DOE, cough, chest tightness,  and wheezing.   Cardiovascular: Denies chest pain, palpitations and leg swelling.  Gastrointestinal: Denies nausea, vomiting, abdominal pain, diarrhea, constipation, blood in stool and abdominal distention.  Genitourinary: Denies dysuria, urgency, frequency, hematuria, flank pain and difficulty urinating.  Endocrine: Denies: hot or cold intolerance, sweats, changes in hair or nails, polyuria, polydipsia. Musculoskeletal: Denies myalgias, back pain, joint swelling, arthralgias and gait problem.  Skin: Denies pallor, rash and wound.  Neurological: Denies dizziness, seizures, syncope, weakness, light-headedness, numbness and headaches.  Hematological: Denies adenopathy. Easy bruising, personal or family bleeding history  Psychiatric/Behavioral: Denies suicidal ideation, mood changes, confusion, nervousness, sleep disturbance and agitation    Physical Exam: Vitals:   02/10/18 0706  BP: 130/80  Pulse: 86  Temp: 98.8 F (37.1 C)  TempSrc: Oral  SpO2: 96%  Weight: 196 lb 12.8 oz (89.3 kg)    Body mass index is 33.26 kg/m.   Constitutional: NAD, calm, comfortable Eyes: PERRL, lids and conjunctivae normal ENMT: Mucous membranes are moist. Posterior pharynx clear of any exudate or  lesions. Normal dentition. Tympanic membrane is pearly white, no erythema or bulging. Neck: normal, supple, no masses, no thyromegaly Respiratory: clear to auscultation bilaterally, no wheezing, no crackles. Normal respiratory effort. No accessory  muscle use.  Cardiovascular: Regular rate and rhythm, no murmurs / rubs / gallops. No extremity edema. 2+ pedal pulses. No carotid bruits.  Abdomen: no tenderness, no masses palpated. No hepatosplenomegaly. Bowel sounds positive.  Musculoskeletal: no clubbing / cyanosis. No joint deformity upper and lower extremities. Good ROM, no contractures. Normal muscle tone.  Skin: no rashes, lesions, ulcers. No induration Neurologic: CN 2-12 grossly intact. Sensation intact, DTR normal. Strength 5/5 in all 4.  Psychiatric: Normal judgment and insight. Alert and oriented x 3. Normal mood.    Impression and Plan:  Dyslipidemia -No statin due to myalgias. Refuses to try even low dose. -LDL 182 in 7/19. -Atorvastatin was on her medication profile but she is not using this.  Will discontinue in EMR.  Essential hypertension -Well-controlled today.  Continue to follow. -Not on meds. -Asked to bring ambulatory BP measurements to next visit.  Impaired glucose tolerance -Check A1C today. -Discussed healthy eating and lifestyle modifications.  Hypertriglyceridemia -313 in 7/19. -Since unable to tolerate statin, will start fenofibrate today. -Patient will need to return in 3 months to recheck cholesterol panel.  She has been asked to come fasting to that visit.  Obesity -Counseled on lifestyle modification.  Constipation -She takes Metamucil 2 or 3 times a week. -Have advised her to take Metamucil daily and to incorporate MiraLAX every third day that she has not had a bowel movement until she is able to. -She is interested in [place] prescription.  Have told her that we will try above measures first.     Patient Instructions  BEFORE YOU LEAVE: -Labs  We have ordered labs or studies at this visit. It can take up to 1-2 weeks for results and processing. IF results require follow up or explanation, we will call you with instructions. Clinically stable results will be released to your [facility]. If  you have not heard from [place] or cannot find your results in [facility] in 2 weeks please contact our office at [phone].  If you are not yet signed up for [facility], please consider signing up.    -follow up: 3 to 4 months.  Please come fasting at that time.  -We have started you on a medicine called fenofibrate/TriCor for your high triglycerides.  You will take 1 tablet daily. Mediterranean Diet A Mediterranean diet refers to food and lifestyle choices that are based on the traditions of countries located on the [employer]. This way of eating has been shown to help prevent certain conditions and improve outcomes for people who have chronic diseases, like kidney disease and heart disease. What are tips for following this plan? Lifestyle  Cook and eat meals together with your family, when possible.  Drink enough fluid to keep your urine clear or pale yellow.  Be physically active every day. This includes: ? Aerobic exercise like running or swimming. ? Leisure activities like gardening, walking, or housework.  Get 7-8 hours of sleep each night.  If recommended by your health care provider, drink red wine in moderation. This means 1 glass a day for nonpregnant women and 2 glasses a day for men. A glass of wine equals 5 oz (150 mL). Reading food labels  Check the serving size of packaged foods. For foods such as rice and pasta, the serving size refers to the  amount of cooked product, not dry.  Check the total fat in packaged foods. Avoid foods that have saturated fat or trans fats.  Check the ingredients list for added sugars, such as corn syrup. Shopping  At the grocery store, buy most of your food from the areas near the walls of the store. This includes: ? Fresh fruits and vegetables (produce). ? Grains, beans, nuts, and seeds. Some of these may be available in unpackaged forms or large amounts (in bulk). ? Fresh seafood. ? Poultry and eggs. ? Low-fat dairy products.  Buy  whole ingredients instead of prepackaged foods.  Buy fresh fruits and vegetables in-season from local farmers markets.  Buy frozen fruits and vegetables in resealable bags.  If you do not have access to quality fresh seafood, buy precooked frozen shrimp or canned fish, such as tuna, salmon, or sardines.  Buy small amounts of raw or cooked vegetables, salads, or olives from the deli or salad bar at your store.  Stock your pantry so you always have certain foods on hand, such as olive oil, canned tuna, canned tomatoes, rice, pasta, and beans. Cooking  Cook foods with extra-virgin olive oil instead of using butter or other vegetable oils.  Have meat as a side dish, and have vegetables or grains as your main dish. This means having meat in small portions or adding small amounts of meat to foods like pasta or stew.  Use beans or vegetables instead of meat in common dishes like chili or lasagna.  Experiment with different cooking methods. Try roasting or broiling vegetables instead of steaming or sauteing them.  Add frozen vegetables to soups, stews, pasta, or rice.  Add nuts or seeds for added healthy fat at each meal. You can add these to yogurt, salads, or vegetable dishes.  Marinate fish or vegetables using olive oil, lemon juice, garlic, and fresh herbs. Meal planning  Plan to eat 1 vegetarian meal one day each week. Try to work up to 2 vegetarian meals, if possible.  Eat seafood 2 or more times a week.  Have healthy snacks readily available, such as: ? Vegetable sticks with hummus. ? [place] yogurt. ? Fruit and nut trail mix.  Eat balanced meals throughout the week. This includes: ? Fruit: 2-3 servings a day ? Vegetables: 4-5 servings a day ? Low-fat dairy: 2 servings a day ? Fish, poultry, or lean meat: 1 serving a day ? Beans and legumes: 2 or more servings a week ? Nuts and seeds: 1-2 servings a day ? Whole grains: 6-8 servings a day ? Extra-virgin olive oil: 3-4  servings a day  Limit red meat and sweets to only a few servings a month What are my food choices?  Mediterranean diet ? Recommended ? Grains: Whole-grain pasta. Brown rice. Bulgar wheat. Polenta. Couscous. Whole-wheat bread. [place]. ? Vegetables: Artichokes. Beets. Broccoli. Cabbage. Carrots. Eggplant. Green beans. Chard. Kale. Spinach. Onions. Leeks. Peas. Squash. Tomatoes. Peppers. Radishes. ? Fruits: Apples. Apricots. Avocado. Berries. Bananas. Cherries. Dates. Figs. Grapes. Lemons. Melon. Oranges. Peaches. Plums. Pomegranate. ? Meats and other protein foods: Beans. Almonds. Sunflower seeds. Pine nuts. Peanuts. [place]. Salmon. Scallops. Shrimp. [place]. Tilapia. Clams. Oysters. Eggs. ? Dairy: Low-fat milk. Cheese. Greek yogurt. ? Beverages: Water. Red wine. Herbal tea. ? Fats and oils: Extra virgin olive oil. Avocado oil. Grape seed oil. ? Sweets and desserts: [place] yogurt with honey. Baked apples. Poached pears. Trail mix. ? Seasoning and other foods: Basil. Cilantro. Coriander. Cumin. Mint. Parsley. Sage. Rosemary. Tarragon. Garlic. Oregano.  Thyme. Pepper. Balsalmic vinegar. Tahini. Hummus. Tomato sauce. Olives. Mushrooms. ? Limit these ? Grains: Prepackaged pasta or rice dishes. Prepackaged cereal with added sugar. ? Vegetables: Deep fried potatoes (french fries). ? Fruits: Fruit canned in syrup. ? Meats and other protein foods: Beef. Pork. Lamb. Poultry with skin. Hot dogs. Berniece Salines. ? Dairy: Ice cream. Sour cream. Whole milk. ? Beverages: Juice. Sugar-sweetened soft drinks. Beer. Liquor and spirits. ? Fats and oils: Butter. Canola oil. Vegetable oil. Beef fat (tallow). Lard. ? Sweets and desserts: Cookies. Cakes. Pies. Candy. ? Seasoning and other foods: Mayonnaise. Premade sauces and marinades. ? The items listed may not be a complete list. Talk with your dietitian about what dietary choices are right for you. Summary  The Mediterranean diet includes both food and lifestyle  choices.  Eat a variety of fresh fruits and vegetables, beans, nuts, seeds, and whole grains.  Limit the amount of red meat and sweets that you eat.  Talk with your health care provider about whether it is safe for you to drink red wine in moderation. This means 1 glass a day for nonpregnant women and 2 glasses a day for men. A glass of wine equals 5 oz (150 mL). This information is not intended to replace advice given to you by your health care provider. Make sure you discuss any questions you have with your health care provider. Document Released: 11/01/2015 Document Revised: 12/04/2015 Document Reviewed: 11/01/2015 Elsevier Interactive Patient Education  2018 [place], [place] Jacklynn Ganong

## 2018-02-10 ENCOUNTER — Encounter: Payer: Self-pay | Admitting: Internal Medicine

## 2018-02-10 ENCOUNTER — Ambulatory Visit (INDEPENDENT_AMBULATORY_CARE_PROVIDER_SITE_OTHER): Payer: 59 | Admitting: Internal Medicine

## 2018-02-10 VITALS — BP 130/80 | HR 86 | Temp 98.8°F | Wt 196.8 lb

## 2018-02-10 DIAGNOSIS — R7302 Impaired glucose tolerance (oral): Secondary | ICD-10-CM | POA: Diagnosis not present

## 2018-02-10 DIAGNOSIS — K5900 Constipation, unspecified: Secondary | ICD-10-CM

## 2018-02-10 DIAGNOSIS — I10 Essential (primary) hypertension: Secondary | ICD-10-CM | POA: Diagnosis not present

## 2018-02-10 DIAGNOSIS — E785 Hyperlipidemia, unspecified: Secondary | ICD-10-CM | POA: Diagnosis not present

## 2018-02-10 DIAGNOSIS — Z6833 Body mass index (BMI) 33.0-33.9, adult: Secondary | ICD-10-CM

## 2018-02-10 DIAGNOSIS — E669 Obesity, unspecified: Secondary | ICD-10-CM

## 2018-02-10 DIAGNOSIS — E781 Pure hyperglyceridemia: Secondary | ICD-10-CM | POA: Diagnosis not present

## 2018-02-10 LAB — HEMOGLOBIN A1C: Hgb A1c MFr Bld: 6.1 % (ref 4.6–6.5)

## 2018-02-10 MED ORDER — FENOFIBRATE 145 MG PO TABS: 145.0000 mg | ORAL_TABLET | Freq: Every day | ORAL | 2 refills | Status: DC

## 2018-02-10 NOTE — Patient Instructions (Signed)
BEFORE YOU LEAVE: -Labs  We have ordered labs or studies at this visit. It can take up to 1-2 weeks for results and processing. IF results require follow up or explanation, we will call you with instructions. Clinically stable results will be released to your [facility]. If you have not heard from [place] or cannot find your results in [facility] in 2 weeks please contact our office at [phone].  If you are not yet signed up for [facility], please consider signing up.    -follow up: 3 to 4 months.  Please come fasting at that time.  -We have started you on a medicine called fenofibrate/TriCor for your high triglycerides.  You will take 1 tablet daily. Mediterranean Diet A Mediterranean diet refers to food and lifestyle choices that are based on the traditions of countries located on the [employer]. This way of eating has been shown to help prevent certain conditions and improve outcomes for people who have chronic diseases, like kidney disease and heart disease. What are tips for following this plan? Lifestyle  Cook and eat meals together with your family, when possible.  Drink enough fluid to keep your urine clear or pale yellow.  Be physically active every day. This includes: ? Aerobic exercise like running or swimming. ? Leisure activities like gardening, walking, or housework.  Get 7-8 hours of sleep each night.  If recommended by your health care provider, drink red wine in moderation. This means 1 glass a day for nonpregnant women and 2 glasses a day for men. A glass of wine equals 5 oz (150 mL). Reading food labels  Check the serving size of packaged foods. For foods such as rice and pasta, the serving size refers to the amount of cooked product, not dry.  Check the total fat in packaged foods. Avoid foods that have saturated fat or trans fats.  Check the ingredients list for added sugars, such as corn syrup. Shopping  At the grocery store, buy most of your food from the areas  near the walls of the store. This includes: ? Fresh fruits and vegetables (produce). ? Grains, beans, nuts, and seeds. Some of these may be available in unpackaged forms or large amounts (in bulk). ? Fresh seafood. ? Poultry and eggs. ? Low-fat dairy products.  Buy whole ingredients instead of prepackaged foods.  Buy fresh fruits and vegetables in-season from local farmers markets.  Buy frozen fruits and vegetables in resealable bags.  If you do not have access to quality fresh seafood, buy precooked frozen shrimp or canned fish, such as tuna, salmon, or sardines.  Buy small amounts of raw or cooked vegetables, salads, or olives from the deli or salad bar at your store.  Stock your pantry so you always have certain foods on hand, such as olive oil, canned tuna, canned tomatoes, rice, pasta, and beans. Cooking  Cook foods with extra-virgin olive oil instead of using butter or other vegetable oils.  Have meat as a side dish, and have vegetables or grains as your main dish. This means having meat in small portions or adding small amounts of meat to foods like pasta or stew.  Use beans or vegetables instead of meat in common dishes like chili or lasagna.  Experiment with different cooking methods. Try roasting or broiling vegetables instead of steaming or sauteing them.  Add frozen vegetables to soups, stews, pasta, or rice.  Add nuts or seeds for added healthy fat at each meal. You can add these to yogurt, salads,  or vegetable dishes.  Marinate fish or vegetables using olive oil, lemon juice, garlic, and fresh herbs. Meal planning  Plan to eat 1 vegetarian meal one day each week. Try to work up to 2 vegetarian meals, if possible.  Eat seafood 2 or more times a week.  Have healthy snacks readily available, such as: ? Vegetable sticks with hummus. ? [place] yogurt. ? Fruit and nut trail mix.  Eat balanced meals throughout the week. This includes: ? Fruit: 2-3 servings a  day ? Vegetables: 4-5 servings a day ? Low-fat dairy: 2 servings a day ? Fish, poultry, or lean meat: 1 serving a day ? Beans and legumes: 2 or more servings a week ? Nuts and seeds: 1-2 servings a day ? Whole grains: 6-8 servings a day ? Extra-virgin olive oil: 3-4 servings a day  Limit red meat and sweets to only a few servings a month What are my food choices?  Mediterranean diet ? Recommended ? Grains: Whole-grain pasta. Brown rice. Bulgar wheat. Polenta. Couscous. Whole-wheat bread. [place]. ? Vegetables: Artichokes. Beets. Broccoli. Cabbage. Carrots. Eggplant. Green beans. Chard. Kale. Spinach. Onions. Leeks. Peas. Squash. Tomatoes. Peppers. Radishes. ? Fruits: Apples. Apricots. Avocado. Berries. Bananas. Cherries. Dates. Figs. Grapes. Lemons. Melon. Oranges. Peaches. Plums. Pomegranate. ? Meats and other protein foods: Beans. Almonds. Sunflower seeds. Pine nuts. Peanuts. [place]. Salmon. Scallops. Shrimp. [place]. Tilapia. Clams. Oysters. Eggs. ? Dairy: Low-fat milk. Cheese. Greek yogurt. ? Beverages: Water. Red wine. Herbal tea. ? Fats and oils: Extra virgin olive oil. Avocado oil. Grape seed oil. ? Sweets and desserts: [place] yogurt with honey. Baked apples. Poached pears. Trail mix. ? Seasoning and other foods: Basil. Cilantro. Coriander. Cumin. Mint. Parsley. Sage. Rosemary. Tarragon. Garlic. Oregano. Thyme. Pepper. Balsalmic vinegar. Tahini. Hummus. Tomato sauce. Olives. Mushrooms. ? Limit these ? Grains: Prepackaged pasta or rice dishes. Prepackaged cereal with added sugar. ? Vegetables: Deep fried potatoes (french fries). ? Fruits: Fruit canned in syrup. ? Meats and other protein foods: Beef. Pork. Lamb. Poultry with skin. Hot dogs. [place] Salines. ? Dairy: Ice cream. Sour cream. Whole milk. ? Beverages: Juice. Sugar-sweetened soft drinks. Beer. Liquor and spirits. ? Fats and oils: Butter. Canola oil. Vegetable oil. Beef fat (tallow). Lard. ? Sweets and desserts: Cookies. Cakes.  Pies. Candy. ? Seasoning and other foods: Mayonnaise. Premade sauces and marinades. ? The items listed may not be a complete list. Talk with your dietitian about what dietary choices are right for you. Summary  The Mediterranean diet includes both food and lifestyle choices.  Eat a variety of fresh fruits and vegetables, beans, nuts, seeds, and whole grains.  Limit the amount of red meat and sweets that you eat.  Talk with your health care provider about whether it is safe for you to drink red wine in moderation. This means 1 glass a day for nonpregnant women and 2 glasses a day for men. A glass of wine equals 5 oz (150 mL). This information is not intended to replace advice given to you by your health care provider. Make sure you discuss any questions you have with your health care provider. Document Released: 11/01/2015 Document Revised: 12/04/2015 Document Reviewed: 11/01/2015 Elsevier Interactive Patient Education  [employer].

## 2018-02-25 ENCOUNTER — Encounter: Payer: Self-pay | Admitting: Internal Medicine

## 2018-02-25 ENCOUNTER — Ambulatory Visit (AMBULATORY_SURGERY_CENTER): Payer: Self-pay

## 2018-02-25 VITALS — Ht 64.0 in | Wt 197.0 lb

## 2018-02-25 DIAGNOSIS — Z1211 Encounter for screening for malignant neoplasm of colon: Secondary | ICD-10-CM

## 2018-02-25 NOTE — Progress Notes (Signed)
Per pt, no allergies to soy or egg products.Pt not taking any weight loss meds or using  O2 at home.  Pt refused emmi video. 

## 2018-03-03 ENCOUNTER — Ambulatory Visit (AMBULATORY_SURGERY_CENTER): Payer: 59 | Admitting: Internal Medicine

## 2018-03-03 ENCOUNTER — Encounter: Payer: Self-pay | Admitting: Internal Medicine

## 2018-03-03 VITALS — BP 155/76 | HR 76 | Temp 96.8°F | Resp 17 | Ht 64.0 in | Wt 197.0 lb

## 2018-03-03 DIAGNOSIS — Z1211 Encounter for screening for malignant neoplasm of colon: Secondary | ICD-10-CM

## 2018-03-03 DIAGNOSIS — D123 Benign neoplasm of transverse colon: Secondary | ICD-10-CM | POA: Diagnosis not present

## 2018-03-03 DIAGNOSIS — D125 Benign neoplasm of sigmoid colon: Secondary | ICD-10-CM

## 2018-03-03 MED ORDER — SODIUM CHLORIDE 0.9 % IV SOLN: 500.0000 mL | INTRAVENOUS | Status: DC

## 2018-03-03 NOTE — Patient Instructions (Addendum)
I found and removed 2 tiny polyps. All else ok.  I will let you know pathology results and when to have another routine colonoscopy by mail and/or My Chart.  I appreciate the opportunity to care for you. Gatha Mayer, MD, FACG    YOU HAD AN ENDOSCOPIC PROCEDURE TODAY AT [facility]:   Refer to the procedure report that was given to you for any specific questions about what was found during the examination.  If the procedure report does not answer your questions, please call your gastroenterologist to clarify.  If you requested that your care partner not be given the details of your procedure findings, then the procedure report has been included in a sealed envelope for you to review at your convenience later.  YOU SHOULD EXPECT: Some feelings of bloating in the abdomen. Passage of more gas than usual.  Walking can help get rid of the air that was put into your GI tract during the procedure and reduce the bloating. If you had a lower endoscopy (such as a colonoscopy or flexible sigmoidoscopy) you may notice spotting of blood in your stool or on the toilet paper. If you underwent a bowel prep for your procedure, you may not have a normal bowel movement for a few days.  Please Note:  You might notice some irritation and congestion in your nose or some drainage.  This is from the oxygen used during your procedure.  There is no need for concern and it should clear up in a day or so.  SYMPTOMS TO REPORT IMMEDIATELY:   Following lower endoscopy (colonoscopy or flexible sigmoidoscopy):  Excessive amounts of blood in the stool  Significant tenderness or worsening of abdominal pains  Swelling of the abdomen that is new, acute  Fever of 100F or higher   For urgent or emergent issues, a gastroenterologist can be reached at any hour by calling [phone].   DIET:  We do recommend a small meal at first, but then you may proceed to your regular diet.  Drink plenty of  fluids but you should avoid alcoholic beverages for 24 hours.  ACTIVITY:  You should plan to take it easy for the rest of today and you should NOT DRIVE or use heavy machinery until tomorrow (because of the sedation medicines used during the test).    FOLLOW UP: Our staff will call the number listed on your records the next business day following your procedure to check on you and address any questions or concerns that you may have regarding the information given to you following your procedure. If we do not reach you, we will leave a message.  However, if you are feeling well and you are not experiencing any problems, there is no need to return our call.  We will assume that you have returned to your regular daily activities without incident.  If any biopsies were taken you will be contacted by phone or by letter within the next 1-3 weeks.  Please call us at [phone] if you have not heard about the biopsies in 3 weeks.    SIGNATURES/CONFIDENTIALITY: You and/or your care partner have signed paperwork which will be entered into your electronic medical record.  These signatures attest to the fact that that the information above on your After Visit Summary has been reviewed and is understood.  Full responsibility of the confidentiality of this discharge information lies with you and/or your care-partner.    Handout was given to your  care partner on polyps. You may resume your current medications today. Await biopsy results. Please call if any questions or concerns.

## 2018-03-03 NOTE — Progress Notes (Signed)
Alert and oriented x3, pleased with MAC, report to RN  

## 2018-03-03 NOTE — Op Note (Signed)
[facility] Patient Name: Vanessa Price Procedure Date: 03/03/2018 1:28 PM MRN: [id number] Endoscopist: Gatha Mayer , MD Age: 62 Referring MD:  Date of Birth: 03-13-1956 Gender: Female Account #: [id number] Procedure:                Colonoscopy Indications:              Screening for colorectal malignant neoplasm, Last                            colonoscopy: 2009 Medicines:                Propofol per Anesthesia, Monitored Anesthesia Care Procedure:                Pre-Anesthesia Assessment:                           - Prior to the procedure, a History and Physical                            was performed, and patient medications and                            allergies were reviewed. The patient's tolerance of                            previous anesthesia was also reviewed. The risks                            and benefits of the procedure and the sedation                            options and risks were discussed with the patient.                            All questions were answered, and informed consent                            was obtained. Prior Anticoagulants: The patient has                            taken no previous anticoagulant or antiplatelet                            agents. ASA Grade Assessment: II - A patient with                            mild systemic disease. After reviewing the risks                            and benefits, the patient was deemed in                            satisfactory condition to undergo the procedure.  After obtaining informed consent, the colonoscope                            was passed under direct vision. Throughout the                            procedure, the patient's blood pressure, pulse, and                            oxygen saturations were monitored continuously. The                            Colonoscope was introduced through the anus and   advanced to the the cecum, identified by                            appendiceal orifice and ileocecal valve. The                            colonoscopy was somewhat difficult due to                            restricted mobility of the colon. Successful                            completion of the procedure was aided by changing                            the patient to a supine position and applying                            abdominal pressure. The patient tolerated the                            procedure well. The quality of the bowel                            preparation was excellent. The bowel preparation                            used was Miralax. Scope In: 1:38:08 PM Scope Out: 1:57:27 PM Scope Withdrawal Time: 0 hours 9 minutes 59 seconds  Total Procedure Duration: 0 hours 19 minutes 19 seconds  Findings:                 The perianal and digital rectal examinations were                            normal.                           Two sessile polyps were found in the sigmoid colon                            and transverse colon. The polyps were diminutive in  size. These polyps were removed with a cold snare.                            Resection and retrieval were complete. Verification                            of patient identification for the specimen was                            done. Estimated blood loss was minimal.                           The exam was otherwise without abnormality on                            direct and retroflexion views. Complications:            No immediate complications. Estimated Blood Loss:     Estimated blood loss was minimal. Impression:               - Two diminutive polyps in the sigmoid colon and in                            the transverse colon, removed with a cold snare.                            Resected and retrieved.                           - The examination was otherwise normal on direct                             and retroflexion views. Recommendation:           - Patient has a contact number available for                            emergencies. The signs and symptoms of potential                            delayed complications were discussed with the                            patient. Return to normal activities tomorrow.                            Written discharge instructions were provided to the                            patient.                           - Continue present medications.                           - Resume previous diet.                           -  Repeat colonoscopy is recommended. The                            colonoscopy date will be determined after pathology                            results from today's exam become available for                            review. Gatha Mayer, MD 03/03/2018 2:05:21 PM This report has been signed electronically.

## 2018-03-03 NOTE — Progress Notes (Signed)
No problems noted in the recovery room. maw 

## 2018-03-04 ENCOUNTER — Telehealth: Payer: Self-pay | Admitting: *Deleted

## 2018-03-04 NOTE — Telephone Encounter (Signed)
  Follow up Call-  Call back number 03/03/2018  Post procedure Call Back phone  # [phone]  Permission to leave phone message Yes  Some recent data might be hidden     Patient questions:  Do you have a fever, pain , or abdominal swelling? No. Pain Score  0 *  Have you tolerated food without any problems? Yes.    Have you been able to return to your normal activities? Yes.    Do you have any questions about your discharge instructions: Diet   No. Medications  No. Follow up visit  No.  Do you have questions or concerns about your Care? No.  Actions: * If pain score is 4 or above: No action needed, pain <4.

## 2018-03-11 ENCOUNTER — Encounter: Payer: Self-pay | Admitting: Internal Medicine

## 2018-03-11 DIAGNOSIS — Z860101 Personal history of adenomatous and serrated colon polyps: Secondary | ICD-10-CM | POA: Insufficient documentation

## 2018-03-11 DIAGNOSIS — Z86010 Personal history of colonic polyps: Secondary | ICD-10-CM

## 2018-03-11 HISTORY — DX: Personal history of colonic polyps: Z86.010

## 2018-03-11 HISTORY — DX: Personal history of adenomatous and serrated colon polyps: Z86.0101

## 2018-03-11 NOTE — Progress Notes (Signed)
1 adenoma diminutive 1 benign mucosa Recall 03/2025 - 7 years  My Chart

## 2018-03-12 ENCOUNTER — Other Ambulatory Visit: Payer: Self-pay | Admitting: Internal Medicine

## 2018-03-12 DIAGNOSIS — E785 Hyperlipidemia, unspecified: Secondary | ICD-10-CM

## 2018-03-12 DIAGNOSIS — E781 Pure hyperglyceridemia: Secondary | ICD-10-CM

## 2018-03-12 MED ORDER — FENOFIBRATE 145 MG PO TABS: 145.0000 mg | ORAL_TABLET | Freq: Every day | ORAL | 2 refills | Status: DC

## 2018-03-12 NOTE — Telephone Encounter (Signed)
Requested Prescriptions  Pending Prescriptions Disp Refills  . fenofibrate (TRICOR) 145 MG tablet 30 tablet 2    Sig: Take 1 tablet (145 mg total) by mouth daily.     Cardiovascular:  Antilipid - Fibric Acid Derivatives Failed - 03/12/2018 10:06 AM      Failed - Total Cholesterol in normal range and within 360 days    Cholesterol  Date Value Ref Range Status  10/13/2017 298 (H) 0 - 200 mg/dL Final    Comment:    ATP III Classification       Desirable:  < 200 mg/dL               Borderline High:  200 - 239 mg/dL          High:  > = 240 mg/dL         Failed - LDL in normal range and within 360 days    LDL Cholesterol  Date Value Ref Range Status  05/28/2009 111 (H) 0 - 99 mg/dL Final         Failed - HDL in normal range and within 360 days    HDL  Date Value Ref Range Status  10/13/2017 37.90 (L) >39.00 mg/dL Final         Failed - Triglycerides in normal range and within 360 days    Triglycerides  Date Value Ref Range Status  10/13/2017 313.0 (H) 0.0 - 149.0 mg/dL Final    Comment:    Normal:  <150 mg/dLBorderline High:  150 - 199 mg/dL   Triglyceride fasting, serum  Date Value Ref Range Status  03/03/2006 142 0 - 149 mg/dL Final    Comment:    See lab report for associated comment(s)         Passed - ALT in normal range and within 180 days    ALT  Date Value Ref Range Status  10/13/2017 17 0 - 35 U/L Final         Passed - AST in normal range and within 180 days    AST  Date Value Ref Range Status  10/13/2017 13 0 - 37 U/L Final         Passed - Cr in normal range and within 180 days    Creatinine, Ser  Date Value Ref Range Status  10/13/2017 0.76 0.40 - 1.20 mg/dL Final         Passed - eGFR in normal range and within 180 days    GFR calc Af Amer  Date Value Ref Range Status  01/04/2008 96 mL/min Final   GFR calc non Af Amer  Date Value Ref Range Status  05/28/2009 111.49 >60 mL/min Final   GFR  Date Value Ref Range Status  10/13/2017 98.55  >60.00 mL/min Final         Passed - Valid encounter within last 12 months    Recent Outpatient Visits          1 month ago Dyslipidemia   [facility], [name], MD   5 months ago Preventative health care   [facility] [place], Doretha Sou, MD   1 year ago Encounter for preventive health examination   [facility] [place], Doretha Sou, MD   2 years ago Essential hypertension   [facility], Doretha Sou, MD   2 years ago Cough   [facility], Audrie Lia, [place]      Future  Appointments            In 3 months [name], [name], MD [employer] [facility] [place], [facility]

## 2018-03-12 NOTE — Telephone Encounter (Signed)
Copied from [place] [phone]. Topic: Quick Communication - See Telephone Encounter >> Mar 12, 2018  9:00 AM Hewitt Shorts wrote: Pt is needing a 90 day supply of fenofidrate-she states she only got 30 and needs it changed to 90  [facility] [address]   Best number [phone]

## 2018-03-19 ENCOUNTER — Telehealth: Payer: Self-pay | Admitting: Internal Medicine

## 2018-03-19 NOTE — Telephone Encounter (Signed)
Results letter reviewed with pt and questions were answered.

## 2018-03-19 NOTE — Telephone Encounter (Signed)
Pt called in wanting to speak with the nurse about the labs results from colon

## 2018-03-29 ENCOUNTER — Telehealth: Payer: Self-pay | Admitting: Internal Medicine

## 2018-03-29 NOTE — Telephone Encounter (Signed)
Pt called in wanting to let the nurse know that she did not recv her lab results through the mail like she was informed and want that mailed out to her.

## 2018-03-29 NOTE — Telephone Encounter (Signed)
Results mailed 

## 2018-06-11 ENCOUNTER — Ambulatory Visit: Payer: 59 | Admitting: Internal Medicine

## 2018-06-22 ENCOUNTER — Other Ambulatory Visit: Payer: Self-pay | Admitting: Internal Medicine

## 2018-06-22 DIAGNOSIS — E781 Pure hyperglyceridemia: Secondary | ICD-10-CM

## 2018-06-22 DIAGNOSIS — E785 Hyperlipidemia, unspecified: Secondary | ICD-10-CM

## 2018-06-22 MED ORDER — FENOFIBRATE 145 MG PO TABS: 145.0000 mg | ORAL_TABLET | Freq: Every day | ORAL | 2 refills | Status: DC

## 2018-06-29 ENCOUNTER — Encounter: Payer: Self-pay | Admitting: Internal Medicine

## 2018-06-29 ENCOUNTER — Other Ambulatory Visit: Payer: Self-pay | Admitting: Internal Medicine

## 2018-06-29 DIAGNOSIS — E785 Hyperlipidemia, unspecified: Secondary | ICD-10-CM

## 2018-06-29 DIAGNOSIS — E781 Pure hyperglyceridemia: Secondary | ICD-10-CM

## 2018-06-29 MED ORDER — FENOFIBRATE 145 MG PO TABS: 145.0000 mg | ORAL_TABLET | Freq: Every day | ORAL | 1 refills | Status: DC

## 2018-10-25 DIAGNOSIS — Z1231 Encounter for screening mammogram for malignant neoplasm of breast: Secondary | ICD-10-CM | POA: Diagnosis not present

## 2018-10-25 LAB — HM MAMMOGRAPHY

## 2018-10-26 ENCOUNTER — Other Ambulatory Visit: Payer: Self-pay | Admitting: Internal Medicine

## 2018-10-26 ENCOUNTER — Ambulatory Visit (INDEPENDENT_AMBULATORY_CARE_PROVIDER_SITE_OTHER): Payer: Medicare Other | Admitting: Internal Medicine

## 2018-10-26 ENCOUNTER — Encounter: Payer: Self-pay | Admitting: Internal Medicine

## 2018-10-26 VITALS — BP 150/90 | HR 93 | Temp 97.3°F | Ht 64.5 in | Wt 198.9 lb

## 2018-10-26 DIAGNOSIS — Z86010 Personal history of colonic polyps: Secondary | ICD-10-CM

## 2018-10-26 DIAGNOSIS — I10 Essential (primary) hypertension: Secondary | ICD-10-CM

## 2018-10-26 DIAGNOSIS — Z23 Encounter for immunization: Secondary | ICD-10-CM

## 2018-10-26 DIAGNOSIS — Z0000 Encounter for general adult medical examination without abnormal findings: Secondary | ICD-10-CM | POA: Diagnosis not present

## 2018-10-26 DIAGNOSIS — E785 Hyperlipidemia, unspecified: Secondary | ICD-10-CM | POA: Diagnosis not present

## 2018-10-26 DIAGNOSIS — R7302 Impaired glucose tolerance (oral): Secondary | ICD-10-CM

## 2018-10-26 DIAGNOSIS — E559 Vitamin D deficiency, unspecified: Secondary | ICD-10-CM

## 2018-10-26 DIAGNOSIS — G8929 Other chronic pain: Secondary | ICD-10-CM

## 2018-10-26 DIAGNOSIS — K219 Gastro-esophageal reflux disease without esophagitis: Secondary | ICD-10-CM

## 2018-10-26 DIAGNOSIS — M25511 Pain in right shoulder: Secondary | ICD-10-CM

## 2018-10-26 LAB — HEMOGLOBIN A1C: Hgb A1c MFr Bld: 6.3 % (ref 4.6–6.5)

## 2018-10-26 LAB — LIPID PANEL
Cholesterol: 249 mg/dL — ABNORMAL HIGH (ref 0–200)
HDL: 39.8 mg/dL (ref >39.00)
NonHDL: 208.77
Total CHOL/HDL Ratio: 6
Triglycerides: 293 mg/dL — ABNORMAL HIGH (ref 0.0–149.0)
VLDL: 58.6 mg/dL — ABNORMAL HIGH (ref 0.0–40.0)

## 2018-10-26 LAB — CBC WITH DIFFERENTIAL/PLATELET
Basophils Absolute: 0 10*3/uL (ref 0.0–0.1)
Basophils Relative: 1 % (ref 0.0–3.0)
Eosinophils Absolute: 0.2 10*3/uL (ref 0.0–0.7)
Eosinophils Relative: 4 % (ref 0.0–5.0)
HCT: 41.8 % (ref 36.0–46.0)
Hemoglobin: 13.6 g/dL (ref 12.0–15.0)
Lymphocytes Relative: 39.6 % (ref 12.0–46.0)
Lymphs Abs: 1.9 10*3/uL (ref 0.7–4.0)
MCHC: 32.6 g/dL (ref 30.0–36.0)
MCV: 85.3 fl (ref 78.0–100.0)
Monocytes Absolute: 0.2 10*3/uL (ref 0.1–1.0)
Monocytes Relative: 5.2 % (ref 3.0–12.0)
Neutro Abs: 2.4 10*3/uL (ref 1.4–7.7)
Neutrophils Relative %: 50.2 % (ref 43.0–77.0)
Platelets: 230 10*3/uL (ref 150.0–400.0)
RBC: 4.9 Mil/uL (ref 3.87–5.11)
RDW: 15.1 % (ref 11.5–15.5)
WBC: 4.8 10*3/uL (ref 4.0–10.5)

## 2018-10-26 LAB — COMPREHENSIVE METABOLIC PANEL
ALT: 22 U/L (ref 0–35)
AST: 18 U/L (ref 0–37)
Albumin: 4.5 g/dL (ref 3.5–5.2)
Alkaline Phosphatase: 49 U/L (ref 39–117)
BUN: 15 mg/dL (ref 6–23)
CO2: 27 mEq/L (ref 19–32)
Calcium: 9.7 mg/dL (ref 8.4–10.5)
Chloride: 107 mEq/L (ref 96–112)
Creatinine, Ser: 0.77 mg/dL (ref 0.40–1.20)
GFR: 91.04 mL/min (ref >60.00)
Glucose, Bld: 111 mg/dL — ABNORMAL HIGH (ref 70–99)
Potassium: 4 mEq/L (ref 3.5–5.1)
Sodium: 141 mEq/L (ref 135–145)
Total Bilirubin: 0.2 mg/dL (ref 0.2–1.2)
Total Protein: 6.8 g/dL (ref 6.0–8.3)

## 2018-10-26 LAB — VITAMIN D 25 HYDROXY (VIT D DEFICIENCY, FRACTURES): VITD: 28.1 ng/mL — ABNORMAL LOW (ref 30.00–100.00)

## 2018-10-26 LAB — TSH: TSH: 1.65 u[IU]/mL (ref 0.35–4.50)

## 2018-10-26 LAB — LDL CHOLESTEROL, DIRECT: Direct LDL: 174 mg/dL

## 2018-10-26 LAB — VITAMIN B12: Vitamin B-12: 796 pg/mL (ref 211–911)

## 2018-10-26 MED ORDER — EZETIMIBE 10 MG PO TABS: 10.0000 mg | ORAL_TABLET | Freq: Every day | ORAL | 3 refills | Status: DC

## 2018-10-26 MED ORDER — VITAMIN D (ERGOCALCIFEROL) 1.25 MG (50000 UNIT) PO CAPS: 50000.0000 [IU] | ORAL_CAPSULE | ORAL | 0 refills | Status: AC

## 2018-10-26 MED ORDER — HYDROCHLOROTHIAZIDE 25 MG PO TABS: 25.0000 mg | ORAL_TABLET | Freq: Every day | ORAL | 3 refills | Status: DC

## 2018-10-26 NOTE — Addendum Note (Signed)
Addended by: Westley Hummer B on: 10/26/2018 05:22 PM   Modules accepted: Orders

## 2018-10-26 NOTE — Progress Notes (Signed)
Established Patient Office Visit     CC/Reason for Visit: Annual preventive exam  HPI: Vanessa Price is a 63 y.o. female who is coming in today for the above mentioned reasons. Past Medical History is significant for: Uncontrolled hypertension not on medications, hyperlipidemia not on statin due to intolerance with myalgias, started on fenofibrate in November 2019 due to hypertriglyceridemia and has yet to have a repeat cholesterol panel, obesity, chronic constipation and impaired glucose tolerance.  She has some mild right shoulder pain today but otherwise has no symptoms.  She has a follow-up with her GYN coming up in the next month.  Is up-to-date on other cancer screening.  She has routine eye and dental care.  She mentions some right shoulder pain that has been bothering her for months.  She states it is not constant is kind of an achy pain more prominent at nighttime while she is sleeping on the shoulder.  No limitation in range of motion.   Past Medical/Surgical History: Past Medical History:  Diagnosis Date  . ANXIETY 06/04/2009  . GERD 08/14/2008   occasional  . Hx of adenomatous polyp of colon 03/11/2018  . HYPERLIPIDEMIA 09/14/2006  . HYPERTENSION 09/14/2006  . Impaired glucose tolerance   . MYOCARDIAL PERFUSION SCAN, WITH STRESS TEST, ABNORMAL 10/19/2008  . PREMATURE VENTRICULAR CONTRACTIONS 08/14/2008    Past Surgical History:  Procedure Laterality Date  . ABDOMINAL HYSTERECTOMY  1999   menorrhagia/partial  . CESAREAN SECTION     2 times    Social History:  reports that she quit smoking about 25 years ago. She has never used smokeless tobacco. She reports current alcohol use of about 1.0 standard drinks of alcohol per week. She reports that she does not use drugs.  Allergies: Allergies  Allergen Reactions  . Lisinopril     REACTION: cough  . Risperidone     REACTION: unsure of dose; pt describes reaction of bad cough    Family History:   Family History  Problem Relation Age of Onset  . Arthritis Mother   . Diabetes Mother   . Heart disease Mother   . Mental illness Father      Current Outpatient Medications:  .  fenofibrate (TRICOR) 145 MG tablet, Take 1 tablet (145 mg total) by mouth daily., Disp: 90 tablet, Rfl: 1 .  Multiple Vitamins-Minerals (ONE DAILY WOMENS 50 PLUS) TABS, Take by mouth. VitaCost Womens 50 plus MVI 2000 units-Take 2 daily, Disp: , Rfl:  .  naproxen sodium (ALEVE) 220 MG tablet, Take 220 mg by mouth. Take 2 pills prn, Disp: , Rfl:  .  psyllium (METAMUCIL) 58.6 % powder, Take 1 packet by mouth daily. Take 2 tablespoons daily, Disp: , Rfl:  .  hydrochlorothiazide (HYDRODIURIL) 25 MG tablet, Take 1 tablet (25 mg total) by mouth daily., Disp: 90 tablet, Rfl: 3  Review of Systems:  Constitutional: Denies fever, chills, diaphoresis, appetite change and fatigue.  HEENT: Denies photophobia, eye pain, redness, hearing loss, ear pain, congestion, sore throat, rhinorrhea, sneezing, mouth sores, trouble swallowing, neck pain, neck stiffness and tinnitus.   Respiratory: Denies SOB, DOE, cough, chest tightness,  and wheezing.   Cardiovascular: Denies chest pain, palpitations and leg swelling.  Gastrointestinal: Denies nausea, vomiting, abdominal pain, diarrhea, constipation, blood in stool and abdominal distention.  Genitourinary: Denies dysuria, urgency, frequency, hematuria, flank pain and difficulty urinating.  Endocrine: Denies: hot or cold intolerance, sweats, changes in hair or nails, polyuria, polydipsia. Musculoskeletal: Denies myalgias, back pain, joint swelling,  arthralgias and gait problem.  Skin: Denies pallor, rash and wound.  Neurological: Denies dizziness, seizures, syncope, weakness, light-headedness, numbness and headaches.  Hematological: Denies adenopathy. Easy bruising, personal or family bleeding history  Psychiatric/Behavioral: Denies suicidal ideation, mood changes, confusion, nervousness,  sleep disturbance and agitation    Physical Exam: Vitals:   10/26/18 0706  BP: (!) 150/90  Pulse: 93  Temp: (!) 97.3 F (36.3 C)  TempSrc: Temporal  SpO2: 96%  Weight: 198 lb 14.4 oz (90.2 kg)  Height: 5' 4.5" (1.638 m)    Body mass index is 33.61 kg/m.   Constitutional: NAD, calm, comfortable Eyes: PERRL, lids and conjunctivae normal ENMT: Mucous membranes are moist. Tympanic membrane is pearly white, no erythema or bulging. Neck: normal, supple, no masses, no thyromegaly Respiratory: clear to auscultation bilaterally, no wheezing, no crackles. Normal respiratory effort. No accessory muscle use.  Cardiovascular: Regular rate and rhythm, no murmurs / rubs / gallops. No extremity edema. 2+ pedal pulses. No carotid bruits.  Abdomen: no tenderness, no masses palpated. No hepatosplenomegaly. Bowel sounds positive.  Musculoskeletal: no clubbing / cyanosis. No joint deformity upper and lower extremities. Good ROM, no contractures. Normal muscle tone.  Skin: no rashes, lesions, ulcers. No induration Neurologic: CN 2-12 grossly intact. Sensation intact, DTR normal. Strength 5/5 in all 4.  Psychiatric: Normal judgment and insight. Alert and oriented x 3. Normal mood.    Impression and Plan:  Encounter for preventive health examination  -Has routine eye and dental care. -Received first in shingles vaccination series today, otherwise vaccinations are up-to-date.  She is not interested in receiving flu vaccination when available. -Healthy lifestyle has been discussed in detail. -Screening labs to be performed today. -She had a colonoscopy in 2019 and is a 5-year callback. -Had a mammogram on August 3 that has been interpreted as normal. -Has follow-up scheduled with her GYN, Dr. Phineas Real, he keeps track of her Pap smears.  Dyslipidemia  -Last LDL was 182 in July 2019, she has severe intolerance to statin. -Started on fenofibrate due to elevated triglycerides of 313, repeat lipids  today for follow-up.  Consider addition of Zetia.  Essential hypertension  -Uncontrolled. -Start hydrochlorothiazide 25 mg daily. -Will return in 6 to 8 weeks for follow-up.  Impaired glucose tolerance  -Last A1c was 6.1 in November 2019. -Recheck A1c today.  Hx of adenomatous polyp of colon  -Is on a 5-year callback schedule for colonoscopy, will be due in 2024.  Gastroesophageal reflux disease without esophagitis -Well-controlled not on medication.  Chronic right shoulder pain  -She does not have limitation of range of motion, no pain to direct palpation of the joint. -Based on her description suspect this is likely acromial bursitis. -She will ice and ibuprofen, if no significant improvement can consider sports medicine referral for cortisone injection.    Patient Instructions  -Nice seeing you today!!  -Lab work today; will notify you once results are available.  -Start HCTZ 25 mg daily for your blood pressure.  -Check BP at home 2-3 times per week, and bring those numbers in to your next visit.  -Consider shingles vaccination series.  -Schedule follow up in 6-8 weeks.   Preventive Care 75-15 Years Old, Female Preventive care refers to visits with your health care provider and lifestyle choices that can promote health and wellness. This includes:  A yearly physical exam. This may also be called an annual well check.  Regular dental visits and eye exams.  Immunizations.  Screening for certain conditions.  Healthy  lifestyle choices, such as eating a healthy diet, getting regular exercise, not using drugs or products that contain nicotine and tobacco, and limiting alcohol use. What can I expect for my preventive care visit? Physical exam Your health care provider will check your:  Height and weight. This may be used to calculate body mass index (BMI), which tells if you are at a healthy weight.  Heart rate and blood pressure.  Skin for abnormal spots.  Counseling Your health care provider may ask you questions about your:  Alcohol, tobacco, and drug use.  Emotional well-being.  Home and relationship well-being.  Sexual activity.  Eating habits.  Work and work Statistician.  Method of birth control.  Menstrual cycle.  Pregnancy history. What immunizations do I need?  Influenza (flu) vaccine  This is recommended every year. Tetanus, diphtheria, and pertussis (Tdap) vaccine  You may need a Td booster every 10 years. Varicella (chickenpox) vaccine  You may need this if you have not been vaccinated. Zoster (shingles) vaccine  You may need this after age 78. Measles, mumps, and rubella (MMR) vaccine  You may need at least one dose of MMR if you were born in 1957 or later. You may also need a second dose. Pneumococcal conjugate (PCV13) vaccine  You may need this if you have certain conditions and were not previously vaccinated. Pneumococcal polysaccharide (PPSV23) vaccine  You may need one or two doses if you smoke cigarettes or if you have certain conditions. Meningococcal conjugate (MenACWY) vaccine  You may need this if you have certain conditions. Hepatitis A vaccine  You may need this if you have certain conditions or if you travel or work in places where you may be exposed to hepatitis A. Hepatitis B vaccine  You may need this if you have certain conditions or if you travel or work in places where you may be exposed to hepatitis B. Haemophilus influenzae type b (Hib) vaccine  You may need this if you have certain conditions. Human papillomavirus (HPV) vaccine  If recommended by your health care provider, you may need three doses over 6 months. You may receive vaccines as individual doses or as more than one vaccine together in one shot (combination vaccines). Talk with your health care provider about the risks and benefits of combination vaccines. What tests do I need? Blood tests  Lipid and cholesterol  levels. These may be checked every 5 years, or more frequently if you are over 56 years old.  Hepatitis C test.  Hepatitis B test. Screening  Lung cancer screening. You may have this screening every year starting at age 41 if you have a 30-pack-year history of smoking and currently smoke or have quit within the past 15 years.  Colorectal cancer screening. All adults should have this screening starting at age 25 and continuing until age 79. Your health care provider may recommend screening at age 30 if you are at increased risk. You will have tests every 1-10 years, depending on your results and the type of screening test.  Diabetes screening. This is done by checking your blood sugar (glucose) after you have not eaten for a while (fasting). You may have this done every 1-3 years.  Mammogram. This may be done every 1-2 years. Talk with your health care provider about when you should start having regular mammograms. This may depend on whether you have a family history of breast cancer.  BRCA-related cancer screening. This may be done if you have a family history of breast,  ovarian, tubal, or peritoneal cancers.  Pelvic exam and Pap test. This may be done every 3 years starting at age 33. Starting at age 65, this may be done every 5 years if you have a Pap test in combination with an HPV test. Other tests  Sexually transmitted disease (STD) testing.  Bone density scan. This is done to screen for osteoporosis. You may have this scan if you are at high risk for osteoporosis. Follow these instructions at home: Eating and drinking  Eat a diet that includes fresh fruits and vegetables, whole grains, lean protein, and low-fat dairy.  Take vitamin and mineral supplements as recommended by your health care provider.  Do not drink alcohol if: ? Your health care provider tells you not to drink. ? You are pregnant, may be pregnant, or are planning to become pregnant.  If you drink alcohol: ?  Limit how much you have to 0-1 drink a day. ? Be aware of how much alcohol is in your drink. In the [place] one drink equals one 12 oz bottle of beer (355 mL), one 5 oz glass of wine (148 mL), or one 1 oz glass of hard liquor (44 mL). Lifestyle  Take daily care of your teeth and gums.  Stay active. Exercise for at least 30 minutes on 5 or more days each week.  Do not use any products that contain nicotine or tobacco, such as cigarettes, e-cigarettes, and chewing tobacco. If you need help quitting, ask your health care provider.  If you are sexually active, practice safe sex. Use a condom or other form of birth control (contraception) in order to prevent pregnancy and STIs (sexually transmitted infections).  If told by your health care provider, take low-dose aspirin daily starting at age 43. What's next?  Visit your health care provider once a year for a well check visit.  Ask your health care provider how often you should have your eyes and teeth checked.  Stay up to date on all vaccines. This information is not intended to replace advice given to you by your health care provider. Make sure you discuss any questions you have with your health care provider. Document Released: 04/06/2015 Document Revised: 11/19/2017 Document Reviewed: 11/19/2017 Elsevier Patient Education  2020 [place], [place] [facility]

## 2018-10-26 NOTE — Addendum Note (Signed)
Addended by: Elmer Picker on: 10/26/2018 08:03 AM   Modules accepted: Orders

## 2018-10-26 NOTE — Patient Instructions (Signed)
-Nice seeing you today!!  -Lab work today; will notify you once results are available.  -Start HCTZ 25 mg daily for your blood pressure.  -Check BP at home 2-3 times per week, and bring those numbers in to your next visit.  -Consider shingles vaccination series.  -Schedule follow up in 6-8 weeks.   Preventive Care 35-65 Years Old, Female Preventive care refers to visits with your health care provider and lifestyle choices that can promote health and wellness. This includes:  A yearly physical exam. This may also be called an annual well check.  Regular dental visits and eye exams.  Immunizations.  Screening for certain conditions.  Healthy lifestyle choices, such as eating a healthy diet, getting regular exercise, not using drugs or products that contain nicotine and tobacco, and limiting alcohol use. What can I expect for my preventive care visit? Physical exam Your health care provider will check your:  Height and weight. This may be used to calculate body mass index (BMI), which tells if you are at a healthy weight.  Heart rate and blood pressure.  Skin for abnormal spots. Counseling Your health care provider may ask you questions about your:  Alcohol, tobacco, and drug use.  Emotional well-being.  Home and relationship well-being.  Sexual activity.  Eating habits.  Work and work Statistician.  Method of birth control.  Menstrual cycle.  Pregnancy history. What immunizations do I need?  Influenza (flu) vaccine  This is recommended every year. Tetanus, diphtheria, and pertussis (Tdap) vaccine  You may need a Td booster every 10 years. Varicella (chickenpox) vaccine  You may need this if you have not been vaccinated. Zoster (shingles) vaccine  You may need this after age 74. Measles, mumps, and rubella (MMR) vaccine  You may need at least one dose of MMR if you were born in 1957 or later. You may also need a second dose. Pneumococcal conjugate  (PCV13) vaccine  You may need this if you have certain conditions and were not previously vaccinated. Pneumococcal polysaccharide (PPSV23) vaccine  You may need one or two doses if you smoke cigarettes or if you have certain conditions. Meningococcal conjugate (MenACWY) vaccine  You may need this if you have certain conditions. Hepatitis A vaccine  You may need this if you have certain conditions or if you travel or work in places where you may be exposed to hepatitis A. Hepatitis B vaccine  You may need this if you have certain conditions or if you travel or work in places where you may be exposed to hepatitis B. Haemophilus influenzae type b (Hib) vaccine  You may need this if you have certain conditions. Human papillomavirus (HPV) vaccine  If recommended by your health care provider, you may need three doses over 6 months. You may receive vaccines as individual doses or as more than one vaccine together in one shot (combination vaccines). Talk with your health care provider about the risks and benefits of combination vaccines. What tests do I need? Blood tests  Lipid and cholesterol levels. These may be checked every 5 years, or more frequently if you are over 28 years old.  Hepatitis C test.  Hepatitis B test. Screening  Lung cancer screening. You may have this screening every year starting at age 17 if you have a 30-pack-year history of smoking and currently smoke or have quit within the past 15 years.  Colorectal cancer screening. All adults should have this screening starting at age 30 and continuing until age 46. Your  health care provider may recommend screening at age 39 if you are at increased risk. You will have tests every 1-10 years, depending on your results and the type of screening test.  Diabetes screening. This is done by checking your blood sugar (glucose) after you have not eaten for a while (fasting). You may have this done every 1-3 years.  Mammogram. This  may be done every 1-2 years. Talk with your health care provider about when you should start having regular mammograms. This may depend on whether you have a family history of breast cancer.  BRCA-related cancer screening. This may be done if you have a family history of breast, ovarian, tubal, or peritoneal cancers.  Pelvic exam and Pap test. This may be done every 3 years starting at age 65. Starting at age 49, this may be done every 5 years if you have a Pap test in combination with an HPV test. Other tests  Sexually transmitted disease (STD) testing.  Bone density scan. This is done to screen for osteoporosis. You may have this scan if you are at high risk for osteoporosis. Follow these instructions at home: Eating and drinking  Eat a diet that includes fresh fruits and vegetables, whole grains, lean protein, and low-fat dairy.  Take vitamin and mineral supplements as recommended by your health care provider.  Do not drink alcohol if: ? Your health care provider tells you not to drink. ? You are pregnant, may be pregnant, or are planning to become pregnant.  If you drink alcohol: ? Limit how much you have to 0-1 drink a day. ? Be aware of how much alcohol is in your drink. In the [place] one drink equals one 12 oz bottle of beer (355 mL), one 5 oz glass of wine (148 mL), or one 1 oz glass of hard liquor (44 mL). Lifestyle  Take daily care of your teeth and gums.  Stay active. Exercise for at least 30 minutes on 5 or more days each week.  Do not use any products that contain nicotine or tobacco, such as cigarettes, e-cigarettes, and chewing tobacco. If you need help quitting, ask your health care provider.  If you are sexually active, practice safe sex. Use a condom or other form of birth control (contraception) in order to prevent pregnancy and STIs (sexually transmitted infections).  If told by your health care provider, take low-dose aspirin daily starting at age 26. What's  next?  Visit your health care provider once a year for a well check visit.  Ask your health care provider how often you should have your eyes and teeth checked.  Stay up to date on all vaccines. This information is not intended to replace advice given to you by your health care provider. Make sure you discuss any questions you have with your health care provider. Document Released: 04/06/2015 Document Revised: 11/19/2017 Document Reviewed: 11/19/2017 Elsevier Patient Education  2020 [employer].

## 2018-10-28 ENCOUNTER — Other Ambulatory Visit: Payer: Self-pay

## 2018-10-29 ENCOUNTER — Encounter: Payer: Self-pay | Admitting: Gynecology

## 2018-10-29 ENCOUNTER — Ambulatory Visit (INDEPENDENT_AMBULATORY_CARE_PROVIDER_SITE_OTHER): Payer: Medicare Other | Admitting: Gynecology

## 2018-10-29 VITALS — BP 138/88 | Ht 65.0 in | Wt 199.0 lb

## 2018-10-29 DIAGNOSIS — N952 Postmenopausal atrophic vaginitis: Secondary | ICD-10-CM

## 2018-10-29 DIAGNOSIS — Z01419 Encounter for gynecological examination (general) (routine) without abnormal findings: Secondary | ICD-10-CM | POA: Diagnosis not present

## 2018-10-29 NOTE — Progress Notes (Signed)
    Vanessa Price 1956-02-19 [id number]        63 y.o.  [id number] for annual gynecologic exam.  Without gynecologic complaints.  Has not been in since 2017.  Past medical history,surgical history, problem list, medications, allergies, family history and social history were all reviewed and documented as reviewed in the EPIC chart.  ROS:  Performed with pertinent positives and negatives included in the history, assessment and plan.   Additional significant findings : None   Exam: Vanessa Price assistant Vitals:   10/29/18 1053  BP: 138/88  Weight: 199 lb (90.3 kg)  Height: 5\' 5"  (1.651 m)   Body mass index is 33.12 kg/m.  General appearance:  Normal affect, orientation and appearance. Skin: Grossly normal HEENT: Without gross lesions.  No cervical or supraclavicular adenopathy. Thyroid normal.  Lungs:  Clear without wheezing, rales or rhonchi Cardiac: RR, without RMG Abdominal:  Soft, nontender, without masses, guarding, rebound, organomegaly or hernia Breasts:  Examined lying and sitting without masses, retractions, discharge or axillary adenopathy. Pelvic:  Ext, BUS, Vagina: With atrophic changes.  Vitiligo changes from periclitoral to perianal region.  Stable from prior exam  Adnexa: Without masses or tenderness    Anus and perineum: Normal   Rectovaginal: Normal sphincter tone without palpated masses or tenderness.    Assessment/Plan:  63 y.o. [id number] female for annual gynecologic exam.  Status post TAH for menorrhagia by Dr. Ree Edman.  1. Postmenopausal.  No significant menopausal symptoms. 2. Vitiligo in the vulvar region.  Unchanged from prior exam.  Asymptomatic to the patient without symptoms of lichen sclerosis. 3. Colonoscopy 2019.  Repeat at their recommended interval. 4. DEXA never.  Recommended baseline DEXA now at age 63.  Patient will schedule in follow-up for this.  5. Mammography 10/2018.  Continue with annual mammography next year.  Breast exam normal  today. 6. Pap smear 2017.  Pap smear done today.  No history of significant abnormal Pap smears.  Options to stop screening per current screening guidelines reviewed. 7. Health maintenance.  No routine lab work done as patient does this elsewhere.  Follow-up 1 year, sooner as needed.   Vanessa Auerbach MD, 11:19 AM 10/29/2018

## 2018-10-29 NOTE — Addendum Note (Signed)
Addended by: Nelva Nay on: 10/29/2018 11:29 AM   Modules accepted: Orders

## 2018-10-29 NOTE — Patient Instructions (Signed)
Follow-up for the bone density as scheduled. 

## 2018-11-01 LAB — PAP IG W/ RFLX HPV ASCU

## 2018-12-13 ENCOUNTER — Telehealth: Payer: Self-pay | Admitting: Internal Medicine

## 2018-12-13 ENCOUNTER — Other Ambulatory Visit: Payer: Self-pay | Admitting: *Deleted

## 2018-12-13 DIAGNOSIS — E785 Hyperlipidemia, unspecified: Secondary | ICD-10-CM

## 2018-12-13 DIAGNOSIS — E781 Pure hyperglyceridemia: Secondary | ICD-10-CM

## 2018-12-13 MED ORDER — FENOFIBRATE 145 MG PO TABS: 145.0000 mg | ORAL_TABLET | Freq: Every day | ORAL | 1 refills | Status: DC

## 2018-12-13 NOTE — Telephone Encounter (Signed)
Medication: fenofibrate (TRICOR) 145 MG tablet [id number]   Has the patient contacted their pharmacy? Yes  (Agent: If no, request that the patient contact the pharmacy for the refill.) (Agent: If yes, when and what did the pharmacy advise?)  Preferred Pharmacy (with phone number or street name): [facility] [phone] [address] [place], [address]  Agent: Please be advised that RX refills may take up to 3 business days. We ask that you follow-up with your pharmacy.

## 2018-12-13 NOTE — Telephone Encounter (Signed)
Clinic RN spoke with patient. Informed patient per lab note 8/4 Dr. Jerilee Hoh reported hertriglycerides have improved on fenofibrate. Add Zetia as she has an intolerance to statins and her LDL is still quite high. Informed patient she will need to return a couple weeks after she finishes last Vitamin D pill to have her levels rechecked. Patient verbalized she has an appointment 01/2019 and will have it done then. Dr. Jerilee Hoh please advise if this was incorrect

## 2018-12-13 NOTE — Telephone Encounter (Signed)
Rx sent to [facility] per patient request. Nothing further needed

## 2018-12-13 NOTE — Telephone Encounter (Signed)
Patient is calling to ask Dr. Jerilee Hoh does she want her to continue on the fenofibrate (TRICOR) 145 MG tablet [id number]  and the Vitamin D, Ergocalciferol, (DRISDOL) 1.25 MG (50000 UT) CAPS capsule [id number]  Please advise [id number]

## 2018-12-14 NOTE — Telephone Encounter (Signed)
All good, thanks!

## 2018-12-28 ENCOUNTER — Other Ambulatory Visit: Payer: Self-pay | Admitting: Critical Care Medicine

## 2018-12-28 ENCOUNTER — Ambulatory Visit: Payer: Medicare Other | Admitting: Internal Medicine

## 2018-12-28 DIAGNOSIS — Z20822 Contact with and (suspected) exposure to covid-19: Secondary | ICD-10-CM

## 2018-12-29 ENCOUNTER — Encounter: Payer: Self-pay | Admitting: Gynecology

## 2018-12-31 LAB — SPECIMEN STATUS REPORT

## 2018-12-31 LAB — NOVEL CORONAVIRUS, NAA: SARS-CoV-2, NAA: NOT DETECTED

## 2019-01-10 ENCOUNTER — Encounter: Payer: Self-pay | Admitting: Internal Medicine

## 2019-01-10 ENCOUNTER — Other Ambulatory Visit: Payer: Self-pay

## 2019-01-11 ENCOUNTER — Other Ambulatory Visit: Payer: Self-pay | Admitting: Gynecology

## 2019-01-11 ENCOUNTER — Encounter: Payer: Self-pay | Admitting: Gynecology

## 2019-01-11 ENCOUNTER — Ambulatory Visit (INDEPENDENT_AMBULATORY_CARE_PROVIDER_SITE_OTHER): Payer: Medicare Other

## 2019-01-11 DIAGNOSIS — Z780 Asymptomatic menopausal state: Secondary | ICD-10-CM

## 2019-01-11 DIAGNOSIS — Z01419 Encounter for gynecological examination (general) (routine) without abnormal findings: Secondary | ICD-10-CM

## 2019-01-24 DIAGNOSIS — H2513 Age-related nuclear cataract, bilateral: Secondary | ICD-10-CM | POA: Diagnosis not present

## 2019-01-24 DIAGNOSIS — Z0100 Encounter for examination of eyes and vision without abnormal findings: Secondary | ICD-10-CM | POA: Diagnosis not present

## 2019-01-24 DIAGNOSIS — H25013 Cortical age-related cataract, bilateral: Secondary | ICD-10-CM | POA: Diagnosis not present

## 2019-01-24 DIAGNOSIS — H524 Presbyopia: Secondary | ICD-10-CM | POA: Diagnosis not present

## 2019-01-24 DIAGNOSIS — H5213 Myopia, bilateral: Secondary | ICD-10-CM | POA: Diagnosis not present

## 2019-01-27 ENCOUNTER — Telehealth: Payer: Self-pay | Admitting: Internal Medicine

## 2019-01-27 ENCOUNTER — Ambulatory Visit: Payer: Self-pay | Admitting: Internal Medicine

## 2019-01-27 NOTE — Telephone Encounter (Signed)
There was a confusion on patient's appt this morning.  The appt was canceled via automated call, however the patient showed up for her appointment,  Patient was upset and wants a call back.

## 2019-01-27 NOTE — Telephone Encounter (Signed)
Per the Phone Tree the patient answered the automated reminder call and pressed "2" to cancel her appointment.   Rill, Rodneisha Ans by Person *2 01/24/2019 11:38 AM [phone] [id number] 01/27/19 07:00am [id number]  Spoke with patient and she states she did not understand the automated system. She did not mean to cancel her appointment. She states she called later that same day and asked about her appointment and even made sure the lab would be open that time of the morning so she assumed her appointment was still valid. I let her know the automated system does not cycle through until midnight so it may have appeared her appointment was still scheduled. I also advised her if she has trouble with the automated system to just not answer it in the future.   Patient has been rescheduled. Nothing further needed.

## 2019-02-09 ENCOUNTER — Other Ambulatory Visit: Payer: Self-pay

## 2019-02-10 ENCOUNTER — Encounter: Payer: Self-pay | Admitting: Internal Medicine

## 2019-02-10 ENCOUNTER — Ambulatory Visit (INDEPENDENT_AMBULATORY_CARE_PROVIDER_SITE_OTHER): Payer: Medicare Other | Admitting: Internal Medicine

## 2019-02-10 VITALS — BP 122/80 | HR 88 | Temp 97.9°F | Wt 206.2 lb

## 2019-02-10 DIAGNOSIS — R7302 Impaired glucose tolerance (oral): Secondary | ICD-10-CM | POA: Diagnosis not present

## 2019-02-10 DIAGNOSIS — E785 Hyperlipidemia, unspecified: Secondary | ICD-10-CM

## 2019-02-10 DIAGNOSIS — Z23 Encounter for immunization: Secondary | ICD-10-CM | POA: Diagnosis not present

## 2019-02-10 DIAGNOSIS — E559 Vitamin D deficiency, unspecified: Secondary | ICD-10-CM

## 2019-02-10 DIAGNOSIS — I10 Essential (primary) hypertension: Secondary | ICD-10-CM

## 2019-02-10 DIAGNOSIS — E669 Obesity, unspecified: Secondary | ICD-10-CM

## 2019-02-10 LAB — VITAMIN D 25 HYDROXY (VIT D DEFICIENCY, FRACTURES): VITD: 37.47 ng/mL (ref 30.00–100.00)

## 2019-02-10 LAB — BASIC METABOLIC PANEL
BUN: 20 mg/dL (ref 6–23)
CO2: 30 mEq/L (ref 19–32)
Calcium: 9.9 mg/dL (ref 8.4–10.5)
Chloride: 103 mEq/L (ref 96–112)
Creatinine, Ser: 0.93 mg/dL (ref 0.40–1.20)
GFR: 73.15 mL/min (ref >60.00)
Glucose, Bld: 118 mg/dL — ABNORMAL HIGH (ref 70–99)
Potassium: 3.6 mEq/L (ref 3.5–5.1)
Sodium: 140 mEq/L (ref 135–145)

## 2019-02-10 LAB — POCT GLYCOSYLATED HEMOGLOBIN (HGB A1C): Hemoglobin A1C: 6.2 % — AB (ref 4.0–5.6)

## 2019-02-10 NOTE — Addendum Note (Signed)
Addended by: Westley Hummer B on: 02/10/2019 04:36 PM   Modules accepted: Orders

## 2019-02-10 NOTE — Progress Notes (Signed)
Established Patient Office Visit     CC/Reason for Visit: Follow-up chronic conditions  HPI: Vanessa Price is a 63 y.o. female who is coming in today for the above mentioned reasons. Past Medical History is significant for: Hypertension that has not been well controlled in the past, hyperlipidemia for which she is intolerant of statins, in August she was started on ezetimibe which she stopped after 2 weeks due to myalgias as well, she has a history of impaired glucose tolerance with an A1c of 6.3 in August and obesity.  At last visit in August we added hydrochlorothiazide as her blood pressure has been uncontrolled.  She has no acute complaints today.  She has gained 6 pounds since I last saw her.  She is requesting her second shingles vaccination today.   Past Medical/Surgical History: Past Medical History:  Diagnosis Date  . ANXIETY 06/04/2009  . GERD 08/14/2008   occasional  . Hx of adenomatous polyp of colon 03/11/2018  . HYPERLIPIDEMIA 09/14/2006  . HYPERTENSION 09/14/2006  . Impaired glucose tolerance   . MYOCARDIAL PERFUSION SCAN, WITH STRESS TEST, ABNORMAL 10/19/2008  . PREMATURE VENTRICULAR CONTRACTIONS 08/14/2008    Past Surgical History:  Procedure Laterality Date  . ABDOMINAL HYSTERECTOMY  1999   menorrhagia/partial  . CESAREAN SECTION     2 times    Social History:  reports that she quit smoking about 25 years ago. She has never used smokeless tobacco. She reports current alcohol use of about 1.0 standard drinks of alcohol per week. She reports that she does not use drugs.  Allergies: Allergies  Allergen Reactions  . Lisinopril     REACTION: cough  . Risperidone     REACTION: unsure of dose; pt describes reaction of bad cough    Family History:  Family History  Problem Relation Age of Onset  . Arthritis Mother   . Diabetes Mother   . Heart disease Mother   . Mental illness Father   . Cancer Brother        Lung     Current Outpatient  Medications:  .  fenofibrate (TRICOR) 145 MG tablet, Take 1 tablet (145 mg total) by mouth daily., Disp: 90 tablet, Rfl: 1 .  hydrochlorothiazide (HYDRODIURIL) 25 MG tablet, Take 1 tablet (25 mg total) by mouth daily., Disp: 90 tablet, Rfl: 3 .  Multiple Vitamins-Minerals (ONE DAILY WOMENS 50 PLUS) TABS, Take by mouth. VitaCost Womens 50 plus MVI 2000 units-Take 2 daily, Disp: , Rfl:  .  naproxen sodium (ALEVE) 220 MG tablet, Take 220 mg by mouth. Take 2 pills prn, Disp: , Rfl:  .  psyllium (METAMUCIL) 58.6 % powder, Take 1 packet by mouth daily. Take 2 tablespoons daily, Disp: , Rfl:  .  ezetimibe (ZETIA) 10 MG tablet, Take 1 tablet (10 mg total) by mouth daily. (Patient not taking: Reported on 02/10/2019), Disp: 90 tablet, Rfl: 3  Review of Systems:  Constitutional: Denies fever, chills, diaphoresis, appetite change and fatigue.  HEENT: Denies photophobia, eye pain, redness, hearing loss, ear pain, congestion, sore throat, rhinorrhea, sneezing, mouth sores, trouble swallowing, neck pain, neck stiffness and tinnitus.   Respiratory: Denies SOB, DOE, cough, chest tightness,  and wheezing.   Cardiovascular: Denies chest pain, palpitations and leg swelling.  Gastrointestinal: Denies nausea, vomiting, abdominal pain, diarrhea, constipation, blood in stool and abdominal distention.  Genitourinary: Denies dysuria, urgency, frequency, hematuria, flank pain and difficulty urinating.  Endocrine: Denies: hot or cold intolerance, sweats, changes in hair or nails, polyuria,  polydipsia. Musculoskeletal: Denies myalgias, back pain, joint swelling, arthralgias and gait problem.  Skin: Denies pallor, rash and wound.  Neurological: Denies dizziness, seizures, syncope, weakness, light-headedness, numbness and headaches.  Hematological: Denies adenopathy. Easy bruising, personal or family bleeding history  Psychiatric/Behavioral: Denies suicidal ideation, mood changes, confusion, nervousness, sleep disturbance and  agitation    Physical Exam: Vitals:   02/10/19 0704  BP: 122/80  Pulse: 88  Temp: 97.9 F (36.6 C)  TempSrc: Temporal  SpO2: 97%  Weight: 206 lb 3.2 oz (93.5 kg)    Body mass index is 34.31 kg/m.   Constitutional: NAD, calm, comfortable Eyes: PERRL, lids and conjunctivae normal ENMT: Mucous membranes are moist.  Respiratory: clear to auscultation bilaterally, no wheezing, no crackles. Normal respiratory effort. No accessory muscle use.  Cardiovascular: Regular rate and rhythm, no murmurs / rubs / gallops. No extremity edema. 2+ pedal pulses.  Abdomen: no tenderness, no masses palpated. No hepatosplenomegaly. Bowel sounds positive.  Musculoskeletal: no clubbing / cyanosis. No joint deformity upper and lower extremities. Good ROM, no contractures. Normal muscle tone.  Skin: no rashes, lesions, ulcers. No induration Neurologic: Grossly intact and nonfocal Psychiatric: Normal judgment and insight. Alert and oriented x 3. Normal mood.    Impression and Plan:  Impaired glucose tolerance -A1c today 6.2. -We have discussed weight loss and healthy lifestyle.  Vitamin D deficiency -She has completed 12 weeks of high-dose weekly vitamin D supplementation. -Recheck levels today.  Essential hypertension  -Well-controlled with addition of hydrochlorothiazide 3 months ago. -Check basic metabolic panel today to follow renal function and electrolytes on diuretic therapy.  Dyslipidemia -LDL was 174 in August 2020. -She is intolerant of statins and ezetimibe both of which have caused myalgias.  She discontinued use of ezetimibe (most recent addition) after only 2 weeks as she felt severe muscle aches to the point where she had difficulty walking. -Will refer her to the lipid clinic to see if we can start her on one of the newer agents such as PSK 9 inhibitor.  Obesity (BMI 30.0-34.9) -Discussed healthy lifestyle, including increased physical activity and better food choices to  promote weight loss.    Patient Instructions  -Nice seeing you today!!  -Lab work today; will notify you once results are available.  -Continue to work on weight loss.  -Will send you to the cholesterol clinic.  -Schedule follow up in 3 months.     [name], MD [facility]

## 2019-02-10 NOTE — Patient Instructions (Signed)
-  Nice seeing you today!!  -Lab work today; will notify you once results are available.  -Continue to work on weight loss.  -Will send you to the cholesterol clinic.  -Schedule follow up in 3 months.

## 2019-02-22 ENCOUNTER — Telehealth: Payer: Self-pay | Admitting: *Deleted

## 2019-02-22 DIAGNOSIS — I10 Essential (primary) hypertension: Secondary | ICD-10-CM

## 2019-02-22 MED ORDER — HYDROCHLOROTHIAZIDE 25 MG PO TABS: 25.0000 mg | ORAL_TABLET | Freq: Every day | ORAL | 0 refills | Status: DC

## 2019-02-22 NOTE — Telephone Encounter (Signed)
Copied from [place] [id number]. Topic: Quick Communication - Rx Refill/Question >> Feb 22, 2019  3:26 PM Carolyn Stare wrote: Medication hydrochlorothiazide (HYDRODIURIL) 25 MG tablet    pt said this med was suppose to be sent in on 02/10/2019  Preferred Pharmacy [facility]   Agent: Please be advised that RX refills may take up to 3 business days. We ask that you follow-up with your pharmacy.   Rx refilled and sent to pharmacy

## 2019-04-06 ENCOUNTER — Other Ambulatory Visit: Payer: Self-pay

## 2019-04-06 ENCOUNTER — Ambulatory Visit (INDEPENDENT_AMBULATORY_CARE_PROVIDER_SITE_OTHER): Payer: Medicare Other | Admitting: Internal Medicine

## 2019-04-06 ENCOUNTER — Encounter: Payer: Self-pay | Admitting: Internal Medicine

## 2019-04-06 VITALS — BP 154/90 | HR 81 | Ht 61.0 in | Wt 207.0 lb

## 2019-04-06 DIAGNOSIS — E785 Hyperlipidemia, unspecified: Secondary | ICD-10-CM | POA: Diagnosis not present

## 2019-04-06 DIAGNOSIS — M791 Myalgia, unspecified site: Secondary | ICD-10-CM | POA: Diagnosis not present

## 2019-04-06 DIAGNOSIS — Z789 Other specified health status: Secondary | ICD-10-CM

## 2019-04-06 DIAGNOSIS — T466X5A Adverse effect of antihyperlipidemic and antiarteriosclerotic drugs, initial encounter: Secondary | ICD-10-CM | POA: Diagnosis not present

## 2019-04-06 NOTE — Progress Notes (Signed)
LIPID CLINIC CONSULT NOTE  Chief Complaint:  Manage dyslipidemia  Primary Care Physician: [name], [name], MD  Primary Cardiologist:  No primary care provider on file.  HPI:  Vanessa Price is a 64 y.o. female who is being seen today for the evaluation of dyslipidemia at the request of [name], [place]*.  This is a pleasant 64 year old female kindly referred by Dr. Jerilee Hoh for evaluation and management of dyslipidemia, specifically elevated triglycerides.  Most recently lipid profile showed total cholesterol of 249, triglycerides 293 (as high as 774 in the past), HDL 39, and LDL of 174.  Unfortunately, she has been intolerant to statins, having failed multiple statins in the past over 20+ years of therapy.  She is currently on fenofibrate which has resulted in marked improvement in her triglycerides however again her LDL cholesterol remains quite high.  There is no known coronary disease.  There is a family history of heart disease.  PMHx:  Past Medical History:  Diagnosis Date  . ANXIETY 06/04/2009  . GERD 08/14/2008   occasional  . Hx of adenomatous polyp of colon 03/11/2018  . HYPERLIPIDEMIA 09/14/2006  . HYPERTENSION 09/14/2006  . Impaired glucose tolerance   . MYOCARDIAL PERFUSION SCAN, WITH STRESS TEST, ABNORMAL 10/19/2008  . PREMATURE VENTRICULAR CONTRACTIONS 08/14/2008    Past Surgical History:  Procedure Laterality Date  . ABDOMINAL HYSTERECTOMY  1999   menorrhagia/partial  . CESAREAN SECTION     2 times    FAMHx:  Family History  Problem Relation Age of Onset  . Arthritis Mother   . Diabetes Mother   . Heart disease Mother   . Mental illness Father   . Cancer Brother        Lung    SOCHx:   reports that she quit smoking about 26 years ago. She has never used smokeless tobacco. She reports current alcohol use of about 1.0 standard drinks of alcohol per week. She reports that she does not use drugs.  ALLERGIES:  Allergies    Allergen Reactions  . Lisinopril     REACTION: cough  . Risperidone     REACTION: unsure of dose; pt describes reaction of bad cough    ROS: Pertinent items noted in HPI and remainder of comprehensive ROS otherwise negative.  HOME MEDS: Current Outpatient Medications on File Prior to Visit  Medication Sig Dispense Refill  . fenofibrate (TRICOR) 145 MG tablet Take 1 tablet (145 mg total) by mouth daily. 90 tablet 1  . hydrochlorothiazide (HYDRODIURIL) 25 MG tablet Take 1 tablet (25 mg total) by mouth daily. 90 tablet 0  . Multiple Vitamins-Minerals (ONE DAILY WOMENS 50 PLUS) TABS Take by mouth. VitaCost Womens 50 plus MVI 2000 units-Take 2 daily    . naproxen sodium (ALEVE) 220 MG tablet Take 220 mg by mouth. Take 2 pills prn    . psyllium (METAMUCIL) 58.6 % powder Take 1 packet by mouth daily. Take 2 tablespoons daily     No current facility-administered medications on file prior to visit.    LABS/IMAGING: No results found for this or any previous visit (from the past 48 hour(s)). No results found.  LIPID PANEL:    Component Value Date/Time   CHOL 249 (H) 10/26/2018 0803   TRIG 293.0 (H) 10/26/2018 0803   TRIG 142 03/03/2006 0906   HDL 39.80 10/26/2018 0803   CHOLHDL 6 10/26/2018 0803   VLDL 58.6 (H) 10/26/2018 0803   LDLCALC 111 (H) 05/28/2009 1029   LDLDIRECT 174.0 10/26/2018  0803    WEIGHTS: Wt Readings from Last 3 Encounters:  04/06/19 207 lb (93.9 kg)  02/10/19 206 lb 3.2 oz (93.5 kg)  10/29/18 199 lb (90.3 kg)    VITALS: BP (!) 154/90   Pulse 81   Ht 5\' 1"  (1.549 m)   Wt 207 lb (93.9 kg)   SpO2 96%   BMI 39.11 kg/m   EXAM: General appearance: alert and no distress Neck: no carotid bruit, no JVD and thyroid not enlarged, symmetric, no tenderness/mass/nodules Lungs: clear to auscultation bilaterally Heart: regular rate and rhythm Abdomen: soft, non-tender; bowel sounds normal; no masses,  no organomegaly Extremities: extremities normal, atraumatic,  no cyanosis or edema Pulses: 2+ and symmetric Skin: Skin color, texture, turgor normal. No rashes or lesions Neurologic: Grossly normal Psych: Pleasant  EKG: Deferred  ASSESSMENT: 1. Mixed dyslipidemia, possible familial combined hyperlipidemia [facility]) 2. Hypertension 3. Statin intolerance-myalgias  PLAN: 1.   Ms. [place] has significantly elevated total cholesterol, triglycerides and LDL cholesterol with low HDL cholesterol, possibly consistently of a familial combined hyperlipidemia.  There is family history of early onset heart disease.  I do think diet could be further optimized and will work on that.  Unfortunately she has been intolerant of statins.  I think she is a good candidate for a PCSK9 inhibitor.  I like to get a coronary calcium score to further restratify her.  If for some reason she has no coronary calcium, we could be a little more conservative with treatment and might try oral therapies, however she has significant coronary calcification, again I would try more aggressive treatments.  Plan follow-up with me afterwards.  Thanks again for the kind referral.  Pixie Casino, MD, FACC, [place] Director of the [facility]  [facility] Diplomate of the American Board of Clinical Lipidology Attending Cardiologist  Direct Dial: [phone]  Fax: [phone]  Website:  [email or web address] 04/06/2019, 4:42 PM

## 2019-04-06 NOTE — Patient Instructions (Signed)
Medication Instructions:  NO CHANGES *If you need a refill on your cardiac medications before your next appointment, please call your pharmacy*  Lab Work: NONE If you have labs (blood work) drawn today and your tests are completely normal, you will receive your results only by: Marland Kitchen MyChart Message (if you have MyChart) OR . A paper copy in the mail If you have any lab test that is abnormal or we need to change your treatment, we will call you to review the results.  Testing/Procedures: DR. HILTY has ordered a CT coronary calcium score. This test is done at [address]. This is $150 out of pocket.   Coronary CalciumScan A coronary calcium scan is an imaging test used to look for deposits of calcium and other fatty materials (plaques) in the inner lining of the blood vessels of the heart (coronary arteries). These deposits of calcium and plaques can partly clog and narrow the coronary arteries without producing any symptoms or warning signs. This puts a person at risk for a heart attack. This test can detect these deposits before symptoms develop. Tell a health care provider about:  Any allergies you have.  All medicines you are taking, including vitamins, herbs, eye drops, creams, and over-the-counter medicines.  Any problems you or family members have had with anesthetic medicines.  Any blood disorders you have.  Any surgeries you have had.  Any medical conditions you have.  Whether you are pregnant or may be pregnant. What are the risks? Generally, this is a safe procedure. However, problems may occur, including:  Harm to a pregnant woman and her unborn baby. This test involves the use of radiation. Radiation exposure can be dangerous to a pregnant woman and her unborn baby. If you are pregnant, you generally should not have this procedure done.  Slight increase in the risk of cancer. This is because of the radiation involved in the test. What happens before  the procedure? No preparation is needed for this procedure. What happens during the procedure?  You will undress and remove any jewelry around your neck or chest.  You will put on a hospital gown.  Sticky electrodes will be placed on your chest. The electrodes will be connected to an electrocardiogram (ECG) machine to record a tracing of the electrical activity of your heart.  A CT scanner will take pictures of your heart. During this time, you will be asked to lie still and hold your breath for 2-3 seconds while a picture of your heart is being taken. The procedure may vary among health care providers and hospitals. What happens after the procedure?  You can get dressed.  You can return to your normal activities.  It is up to you to get the results of your test. Ask your health care provider, or the department that is doing the test, when your results will be ready. Summary  A coronary calcium scan is an imaging test used to look for deposits of calcium and other fatty materials (plaques) in the inner lining of the blood vessels of the heart (coronary arteries).  Generally, this is a safe procedure. Tell your health care provider if you are pregnant or may be pregnant.  No preparation is needed for this procedure.  A CT scanner will take pictures of your heart.  You can return to your normal activities after the scan is done. This information is not intended to replace advice given to you by your health care provider. Make sure  you discuss any questions you have with your health care provider. Document Released: 09/06/2007 Document Revised: 01/28/2016 Document Reviewed: 01/28/2016 Elsevier Interactive Patient Education  2017 [place]: At [facility], you and your health needs are our priority.  As part of our continuing mission to provide you with exceptional heart care, we have created designated Provider Care Teams.  These Care Teams include your primary  Cardiologist (physician) and Advanced Practice Providers (APPs -  Physician Assistants and Nurse Practitioners) who all work together to provide you with the care you need, when you need it.  Your next appointment:   FOLLOWING CORONARY CALCIUM SCORING  The format for your next appointment:   Either In Person or Virtual  Provider:   K. Mali Hilty, MD

## 2019-04-13 ENCOUNTER — Ambulatory Visit (INDEPENDENT_AMBULATORY_CARE_PROVIDER_SITE_OTHER)
Admission: RE | Admit: 2019-04-13 | Discharge: 2019-04-13 | Disposition: A | Discharge status: Home / Self Care | Payer: Self-pay | Source: Ambulatory Visit | Attending: Internal Medicine | Admitting: Internal Medicine

## 2019-04-13 ENCOUNTER — Other Ambulatory Visit: Payer: Self-pay

## 2019-04-13 DIAGNOSIS — Z789 Other specified health status: Secondary | ICD-10-CM

## 2019-04-13 DIAGNOSIS — E785 Hyperlipidemia, unspecified: Secondary | ICD-10-CM

## 2019-05-03 ENCOUNTER — Telehealth (INDEPENDENT_AMBULATORY_CARE_PROVIDER_SITE_OTHER): Payer: Medicare Other | Admitting: Internal Medicine

## 2019-05-03 ENCOUNTER — Encounter: Payer: Self-pay | Admitting: Internal Medicine

## 2019-05-03 VITALS — Ht 61.0 in | Wt 202.0 lb

## 2019-05-03 DIAGNOSIS — M791 Myalgia, unspecified site: Secondary | ICD-10-CM

## 2019-05-03 DIAGNOSIS — T466X5A Adverse effect of antihyperlipidemic and antiarteriosclerotic drugs, initial encounter: Secondary | ICD-10-CM

## 2019-05-03 DIAGNOSIS — E782 Mixed hyperlipidemia: Secondary | ICD-10-CM

## 2019-05-03 DIAGNOSIS — E785 Hyperlipidemia, unspecified: Secondary | ICD-10-CM

## 2019-05-03 DIAGNOSIS — I10 Essential (primary) hypertension: Secondary | ICD-10-CM

## 2019-05-03 NOTE — Patient Instructions (Signed)
Medication Instructions:  NO CHANGES - will depend on lipid panel results  *If you need a refill on your cardiac medications before your next appointment, please call your pharmacy*  Lab Work: FASTING lipid panel to be completed as soon as able   If you have labs (blood work) drawn today and your tests are completely normal, you will receive your results only by: Marland Kitchen MyChart Message (if you have MyChart) OR . A paper copy in the mail If you have any lab test that is abnormal or we need to change your treatment, we will call you to review the results.  Testing/Procedures: NONE  Follow-Up: At [facility], you and your health needs are our priority.  As part of our continuing mission to provide you with exceptional heart care, we have created designated Provider Care Teams.  These Care Teams include your primary Cardiologist (physician) and Advanced Practice Providers (APPs -  Physician Assistants and Nurse Practitioners) who all work together to provide you with the care you need, when you need it.  Your next appointment:   3-4 month(s) - lipid clinic  The format for your next appointment:   Either In Person or Virtual  Provider:   K. Mali Hilty, MD  Other Instructions

## 2019-05-03 NOTE — Progress Notes (Signed)
Virtual Visit via Video Note   This visit type was conducted due to national recommendations for restrictions regarding the COVID-19 Pandemic (e.g. social distancing) in an effort to limit this patient's exposure and mitigate transmission in our community.  Due to her co-morbid illnesses, this patient is at least at moderate risk for complications without adequate follow up.  This format is felt to be most appropriate for this patient at this time.  All issues noted in this document were discussed and addressed.  A limited physical exam was performed with this format.  Please refer to the patient's chart for her consent to telehealth for [facility].   Evaluation Performed:  Doxy.me video visit  Date:  05/03/2019   ID:  Vanessa Price, DOB [birth year], MRN [id number]  Patient Location:  [address] [place] [address]  Provider location:   [address] [place], [place] [address]  PCP:  [name], [name], MD  Cardiologist:  No primary care provider on file. Electrophysiologist:  None   Chief Complaint:  Follow-up calcium score  History of Present Illness:    Vanessa Price is a 64 y.o. female who presents via Engineer, civil (consulting) for a telehealth visit today.  This is a pleasant 65 year old female kindly referred by Dr. Jerilee Hoh for evaluation and management of dyslipidemia, specifically elevated triglycerides.  Most recently lipid profile showed total cholesterol of 249, triglycerides 293 (as high as 774 in the past), HDL 39, and LDL of 174.  Unfortunately, she has been intolerant to statins, having failed multiple statins in the past over 20+ years of therapy.  She is currently on fenofibrate which has resulted in marked improvement in her triglycerides however again her LDL cholesterol remains quite high.  There is no known coronary disease.  There is a family history of heart disease.  05/03/2019  Vanessa Price returns  today for follow-up.  She underwent CT calcium scoring in January which showed 0 coronary calcium.  There was hepatic steatosis.  Overall the findings are very favorable and suggest a low risk, however this makes interpretation of her lipid profile little more interesting.  Although she has significantly elevated cholesterol with low HDL and high triglycerides, I would favor diagnosis of familial combined hyperlipidemia.  There is family history of heart disease as well.  That being said I think treatment is warranted.  Unfortunately she has been intolerant of at least 2 statin medications causing myalgias and ezetimibe which also caused the same issue.  The patient does not have symptoms concerning for COVID-19 infection (fever, chills, cough, or new SHORTNESS OF BREATH).    Prior CV studies:   The following studies were reviewed today:  Coronary calcium score  PMHx:  Past Medical History:  Diagnosis Date  . ANXIETY 06/04/2009  . GERD 08/14/2008   occasional  . Hx of adenomatous polyp of colon 03/11/2018  . HYPERLIPIDEMIA 09/14/2006  . HYPERTENSION 09/14/2006  . Impaired glucose tolerance   . MYOCARDIAL PERFUSION SCAN, WITH STRESS TEST, ABNORMAL 10/19/2008  . PREMATURE VENTRICULAR CONTRACTIONS 08/14/2008    Past Surgical History:  Procedure Laterality Date  . ABDOMINAL HYSTERECTOMY  1999   menorrhagia/partial  . CESAREAN SECTION     2 times    FAMHx:  Family History  Problem Relation Age of Onset  . Arthritis Mother   . Diabetes Mother   . Heart disease Mother   . Mental illness Father   . Cancer Brother        Lung  SOCHx:   reports that she quit smoking about 26 years ago. She has never used smokeless tobacco. She reports current alcohol use of about 1.0 standard drinks of alcohol per week. She reports that she does not use drugs.  ALLERGIES:  Allergies  Allergen Reactions  . Lisinopril     REACTION: cough  . Risperidone     REACTION: unsure of dose; pt describes  reaction of bad cough    MEDS:  Current Meds  Medication Sig  . fenofibrate (TRICOR) 145 MG tablet Take 1 tablet (145 mg total) by mouth daily.  . hydrochlorothiazide (HYDRODIURIL) 25 MG tablet Take 1 tablet (25 mg total) by mouth daily.  . Multiple Vitamins-Minerals (ONE DAILY WOMENS 50 PLUS) TABS Take by mouth. VitaCost Womens 50 plus MVI 2000 units-Take 2 daily  . naproxen sodium (ALEVE) 220 MG tablet Take 220 mg by mouth. Take 2 pills prn  . psyllium (METAMUCIL) 58.6 % powder Take 1 packet by mouth daily. Take 2 tablespoons daily     ROS: Pertinent items noted in HPI and remainder of comprehensive ROS otherwise negative.  Labs/Other Tests and Data Reviewed:    Recent Labs: 10/26/2018: ALT 22; Hemoglobin 13.6; Platelets 230.0; TSH 1.65 02/10/2019: BUN 20; Creatinine, Ser 0.93; Potassium 3.6; Sodium 140   Recent Lipid Panel Lab Results  Component Value Date/Time   CHOL 249 (H) 10/26/2018 08:03 AM   TRIG 293.0 (H) 10/26/2018 08:03 AM   TRIG 142 03/03/2006 09:06 AM   HDL 39.80 10/26/2018 08:03 AM   CHOLHDL 6 10/26/2018 08:03 AM   LDLCALC 111 (H) 05/28/2009 10:29 AM   LDLDIRECT 174.0 10/26/2018 08:03 AM    Wt Readings from Last 3 Encounters:  05/03/19 202 lb (91.6 kg)  04/06/19 207 lb (93.9 kg)  02/10/19 206 lb 3.2 oz (93.5 kg)     Exam:    Vital Signs:  Ht 5\' 1"  (1.549 m)   Wt 202 lb (91.6 kg)   BMI 38.17 kg/m    General appearance: alert and no distress Lungs: No visual respiratory difficulty Extremities: extremities normal, atraumatic, no cyanosis or edema Neurologic: Grossly normal  ASSESSMENT & PLAN:    1. Mixed dyslipidemia, possible familial combined hyperlipidemia [facility]) 2. Hypertension 3. Statin and zetia intolerance-myalgias  4. Zero CAC score (1/20201)  Vanessa Price fortunately had a 0 calcium score which predicts a low risk however does have significant dyslipidemia.  We will continue to work on dietary modifications.  I agree with the fibrate  as her triglycerides were well over 500 initially but have improved.  I would like to repeat a lipid profile.  If her LDL is over 190, I think we can make an argument for PCSK9 inhibitor based on current guidelines to lower LDL cholesterol greater than 50%.  If not, then I would likely pursue Nexletol (bempedoic acid) which has a lower potency but is very well-tolerated.  We would then likely repeat liver enzymes and a lipid profile after starting the medication.  COVID-19 Education: The signs and symptoms of COVID-19 were discussed with the patient and how to seek care for testing (follow up with PCP or arrange E-visit).  The importance of social distancing was discussed today.  Patient Risk:   After full review of this patients clinical status, I feel that they are at least moderate risk at this time.  Time:   Today, I have spent 25 minutes with the patient with telehealth technology discussing dyslipidemia, calcium score, medication intolerance.     Medication  Adjustments/Labs and Tests Ordered: Current medicines are reviewed at length with the patient today.  Concerns regarding medicines are outlined above.   Tests Ordered: Orders Placed This Encounter  Procedures  . Lipid panel    Medication Changes: No orders of the defined types were placed in this encounter.   Disposition:  in 4 month(s)  Pixie Casino, MD, [facility], [place] Director of the Advanced Lipid Disorders &  Cardiovascular Risk Reduction Clinic Diplomate of the American Board of Clinical Lipidology Attending Cardiologist  Direct Dial: [phone]  Fax: [phone]  Website:  [email or web address]  Pixie Casino, MD  05/03/2019 8:39 AM

## 2019-05-05 ENCOUNTER — Other Ambulatory Visit: Payer: Self-pay | Admitting: Internal Medicine

## 2019-05-05 DIAGNOSIS — E785 Hyperlipidemia, unspecified: Secondary | ICD-10-CM

## 2019-05-05 DIAGNOSIS — E781 Pure hyperglyceridemia: Secondary | ICD-10-CM

## 2019-05-13 ENCOUNTER — Ambulatory Visit: Payer: Medicare Other | Admitting: Internal Medicine

## 2019-05-20 NOTE — Telephone Encounter (Signed)
LM reminding patient of pending lipid panel order that she was notified of via MyChart message after her 05/03/19 visit and also was mailed to her

## 2019-06-23 ENCOUNTER — Encounter: Payer: Self-pay | Admitting: Internal Medicine

## 2019-06-23 ENCOUNTER — Other Ambulatory Visit: Payer: Self-pay

## 2019-06-23 ENCOUNTER — Ambulatory Visit (INDEPENDENT_AMBULATORY_CARE_PROVIDER_SITE_OTHER): Payer: Medicare Other | Admitting: Internal Medicine

## 2019-06-23 VITALS — BP 140/90 | HR 78 | Temp 97.2°F | Wt 198.7 lb

## 2019-06-23 DIAGNOSIS — R7309 Other abnormal glucose: Secondary | ICD-10-CM

## 2019-06-23 DIAGNOSIS — E785 Hyperlipidemia, unspecified: Secondary | ICD-10-CM

## 2019-06-23 DIAGNOSIS — I10 Essential (primary) hypertension: Secondary | ICD-10-CM | POA: Diagnosis not present

## 2019-06-23 DIAGNOSIS — R7302 Impaired glucose tolerance (oral): Secondary | ICD-10-CM | POA: Diagnosis not present

## 2019-06-23 LAB — LIPID PANEL
Cholesterol: 256 mg/dL — ABNORMAL HIGH (ref 0–200)
HDL: 41.9 mg/dL (ref >39.00)
NonHDL: 213.66
Total CHOL/HDL Ratio: 6
Triglycerides: 203 mg/dL — ABNORMAL HIGH (ref 0.0–149.0)
VLDL: 40.6 mg/dL — ABNORMAL HIGH (ref 0.0–40.0)

## 2019-06-23 LAB — LDL CHOLESTEROL, DIRECT: Direct LDL: 177 mg/dL

## 2019-06-23 LAB — POCT GLYCOSYLATED HEMOGLOBIN (HGB A1C): Hemoglobin A1C: 6.2 % — AB (ref 4.0–5.6)

## 2019-06-23 MED ORDER — LOSARTAN POTASSIUM 50 MG PO TABS: 50.0000 mg | ORAL_TABLET | Freq: Every day | ORAL | 1 refills | Status: DC

## 2019-06-23 NOTE — Progress Notes (Signed)
Established Patient Office Visit     This visit occurred during the SARS-CoV-2 public health emergency.  Safety protocols were in place, including screening questions prior to the visit, additional usage of staff PPE, and extensive cleaning of exam room while observing appropriate contact time as indicated for disinfecting solutions.    CC/Reason for Visit: Follow-up chronic medical conditions  HPI: Vanessa Price is a 64 y.o. female who is coming in today for the above mentioned reasons. Past Medical History is significant for: Hypertension that has not been well controlled despite injection hydrochlorothiazide 25 mg, hyperlipidemia intolerant to statins and ezetimibe currently being followed at the advanced lipid clinic, impaired glucose tolerance with a most recent A1c of 6.2 in November 2020.  She has lost 4 pounds since I last saw her.  She has no complaints today.  She is very hesitant to increase medications.   Past Medical/Surgical History: Past Medical History:  Diagnosis Date  . ANXIETY 06/04/2009  . GERD 08/14/2008   occasional  . Hx of adenomatous polyp of colon 03/11/2018  . HYPERLIPIDEMIA 09/14/2006  . HYPERTENSION 09/14/2006  . Impaired glucose tolerance   . MYOCARDIAL PERFUSION SCAN, WITH STRESS TEST, ABNORMAL 10/19/2008  . PREMATURE VENTRICULAR CONTRACTIONS 08/14/2008    Past Surgical History:  Procedure Laterality Date  . ABDOMINAL HYSTERECTOMY  1999   menorrhagia/partial  . CESAREAN SECTION     2 times    Social History:  reports that she quit smoking about 26 years ago. She has never used smokeless tobacco. She reports current alcohol use of about 1.0 standard drinks of alcohol per week. She reports that she does not use drugs.  Allergies: Allergies  Allergen Reactions  . Lisinopril     REACTION: cough  . Risperidone     REACTION: unsure of dose; pt describes reaction of bad cough    Family History:  Family History  Problem Relation  Age of Onset  . Arthritis Mother   . Diabetes Mother   . Heart disease Mother   . Mental illness Father   . Cancer Brother        Lung     Current Outpatient Medications:  .  fenofibrate (TRICOR) 145 MG tablet, TAKE 1 TABLET BY MOUTH  DAILY, Disp: 90 tablet, Rfl: 1 .  hydrochlorothiazide (HYDRODIURIL) 25 MG tablet, Take 1 tablet (25 mg total) by mouth daily., Disp: 90 tablet, Rfl: 0 .  Multiple Vitamins-Minerals (ONE DAILY WOMENS 50 PLUS) TABS, Take by mouth. VitaCost Womens 50 plus MVI 2000 units-Take 2 daily, Disp: , Rfl:  .  naproxen sodium (ALEVE) 220 MG tablet, Take 220 mg by mouth. Take 2 pills prn, Disp: , Rfl:  .  psyllium (METAMUCIL) 58.6 % powder, Take 1 packet by mouth daily. Take 2 tablespoons daily, Disp: , Rfl:  .  losartan (COZAAR) 50 MG tablet, Take 1 tablet (50 mg total) by mouth daily., Disp: 90 tablet, Rfl: 1  Review of Systems:  Constitutional: Denies fever, chills, diaphoresis, appetite change and fatigue.  HEENT: Denies photophobia, eye pain, redness, hearing loss, ear pain, congestion, sore throat, rhinorrhea, sneezing, mouth sores, trouble swallowing, neck pain, neck stiffness and tinnitus.   Respiratory: Denies SOB, DOE, cough, chest tightness,  and wheezing.   Cardiovascular: Denies chest pain, palpitations and leg swelling.  Gastrointestinal: Denies nausea, vomiting, abdominal pain, diarrhea, constipation, blood in stool and abdominal distention.  Genitourinary: Denies dysuria, urgency, frequency, hematuria, flank pain and difficulty urinating.  Endocrine: Denies: hot or cold intolerance,  sweats, changes in hair or nails, polyuria, polydipsia. Musculoskeletal: Denies myalgias, back pain, joint swelling, arthralgias and gait problem.  Skin: Denies pallor, rash and wound.  Neurological: Denies dizziness, seizures, syncope, weakness, light-headedness, numbness and headaches.  Hematological: Denies adenopathy. Easy bruising, personal or family bleeding history    Psychiatric/Behavioral: Denies suicidal ideation, mood changes, confusion, nervousness, sleep disturbance and agitation    Physical Exam: Vitals:   06/23/19 0729  BP: 140/90  Pulse: 78  Temp: (!) 97.2 F (36.2 C)  TempSrc: Temporal  SpO2: 97%  Weight: 198 lb 11.2 oz (90.1 kg)    Body mass index is 37.54 kg/m.   Constitutional: NAD, calm, comfortable Eyes: PERRL, lids and conjunctivae normal ENMT: Mucous membranes are moist.  Respiratory: clear to auscultation bilaterally, no wheezing, no crackles. Normal respiratory effort. No accessory muscle use.  Cardiovascular: Regular rate and rhythm, no murmurs / rubs / gallops. No extremity edema.  Neurologic: Grossly intact and nonfocal Psychiatric: Normal judgment and insight. Alert and oriented x 3. Normal mood.    Impression and Plan:  Essential hypertension  -Remains uncontrolled despite maximum dose hydrochlorothiazide in 140/90 today. -Add losartan 50 mg daily. -In office follow-up in 6 to 8 weeks.  Impaired glucose tolerance -A1c in office today is 6.1. -Continue lifestyle modifications.  Not on medications.  Dyslipidemia -most recent LDL of 174. -Currently being followed at the lipid clinic, they are considering adding additional therapies.  Will obtain lipids that were requested by Dr. Debara Pickett today and forward results to him.  Morbid obesity ([place]) -Discussed healthy lifestyle, including increased physical activity and better food choices to promote weight loss. -We have discussed meal planning for the week on the weekends.    Patient Instructions  -Nice seeing you today!!  -Lab work today; will notify you once results are available.  -Start losartan 50 mg daily. Continue taking HCTZ 25 mg daily.  -Schedule a 6 week follow up for your blood pressure.     [name], MD [facility]

## 2019-06-23 NOTE — Addendum Note (Signed)
Addended by: Elmer Picker on: 06/23/2019 08:06 AM   Modules accepted: Orders

## 2019-06-23 NOTE — Patient Instructions (Signed)
-  Nice seeing you today!!  -Lab work today; will notify you once results are available.  -Start losartan 50 mg daily. Continue taking HCTZ 25 mg daily.  -Schedule a 6 week follow up for your blood pressure.

## 2019-06-24 ENCOUNTER — Telehealth: Payer: Self-pay | Admitting: Internal Medicine

## 2019-06-24 DIAGNOSIS — E785 Hyperlipidemia, unspecified: Secondary | ICD-10-CM

## 2019-06-24 DIAGNOSIS — Z789 Other specified health status: Secondary | ICD-10-CM

## 2019-06-24 NOTE — Telephone Encounter (Signed)
-----   Message from Fidel Levy, RN sent at 06/16/2019 10:23 AM EDT ----- Regarding: FW: order LFT to be done 2-4 weeks after nexletol for hepatic steatosis - notify patient Check on lipid panel from PCP  ----- Message ----- From: Fidel Levy, RN Sent: 05/03/2019   8:39 AM EDT To: Fidel Levy, RN Subject: order LFT to be done 2-4 weeks after nexleto#

## 2019-06-24 NOTE — Telephone Encounter (Signed)
PA for nexletol submitted via CMM (Key: [id number])

## 2019-06-27 ENCOUNTER — Other Ambulatory Visit: Payer: Self-pay | Admitting: Internal Medicine

## 2019-06-27 DIAGNOSIS — I10 Essential (primary) hypertension: Secondary | ICD-10-CM

## 2019-06-27 NOTE — Telephone Encounter (Addendum)
Request Reference Number: [id number]. NEXLETOL TAB 180MG  is approved through 03/23/2020

## 2019-06-27 NOTE — Telephone Encounter (Signed)
MyChart message sent to patient Ideally will move May appt out to about July to give patient about 3 months on new medication

## 2019-06-30 MED ORDER — NEXLETOL 180 MG PO TABS: 1.0000 | ORAL_TABLET | Freq: Every day | ORAL | 3 refills | Status: DC

## 2019-06-30 NOTE — Telephone Encounter (Signed)
Spoke with patient about Nexletol - she had questions and spoke with Dr. Debara Pickett about this as well - he answered questions about potential side effects such as gout, tendonitis but that myalgias are not typical. LDL reduction is expected to be about 20-25%.   Rx sent to [facility] per request She will need LFT 2-4 weeks after starting medication - notified via [place] to patient to check her patient portal for info on [employer] grant application

## 2019-06-30 NOTE — Addendum Note (Signed)
Addended by: Fidel Levy on: 06/30/2019 10:52 AM   Modules accepted: Orders

## 2019-07-07 ENCOUNTER — Telehealth: Payer: Self-pay | Admitting: Internal Medicine

## 2019-07-07 DIAGNOSIS — E785 Hyperlipidemia, unspecified: Secondary | ICD-10-CM

## 2019-07-07 DIAGNOSIS — E781 Pure hyperglyceridemia: Secondary | ICD-10-CM

## 2019-07-07 MED ORDER — FENOFIBRATE 145 MG PO TABS: 145.0000 mg | ORAL_TABLET | Freq: Every day | ORAL | 1 refills | Status: DC

## 2019-07-07 NOTE — Telephone Encounter (Signed)
Pt is requesting a refill on Fenofibrate 145 mg tablet. Pt would medication sent to [facility] mail service. Thanks

## 2019-07-11 ENCOUNTER — Telehealth: Payer: Self-pay | Admitting: Internal Medicine

## 2019-07-11 ENCOUNTER — Encounter: Payer: Self-pay | Admitting: Internal Medicine

## 2019-07-11 NOTE — Telephone Encounter (Signed)
Patient was advised by Jennye Moccasin, RN (Triage Nurse with [facility]) to see PCP within 3 days as well as TO SOFTEN STOOLS AND TREAT CONSTIPATION: * Eat a high fiber diet. * Drink adequate liquids (6-8 glasses of water a day) * Exercise regularly (even a daily 15 minute walk!) * Get into a rhythm - try to have a BM at the same time each day. * Don't ignore your body's signals regarding having a BM * Avoid enemas and stimulant laxatives. HIGH FIBER DIET: * A high fiber diet will help improve your intestinal function and soften your BM's. The fiber works by holding more water in your stools. * Try to eat fresh fruit and vegetables at each meal (peas, prunes, citrus, apples, beans, corn). * Eat more grain foods (bran flakes, bran muffins, graham crackers, oatmeal, brown rice, and whole wheat bread). Popcorn is a source of fiber. CALL BACK IF: * Dizziness occurs * Bleeding increases * You become worse. CARE ADVICE given per Rectal Bleeding (Adult) guideline.

## 2019-07-11 NOTE — Telephone Encounter (Signed)
Pt called requesting a call back from Dr. Jerilee Hoh nurse. Pt is having bleeding after bowel movement.   Transferred pt to triage nurse as well.

## 2019-07-12 NOTE — Telephone Encounter (Signed)
Patient has a virtual appointment 07/15/19.

## 2019-07-14 MED ORDER — NEXLETOL 180 MG PO TABS: 1.0000 | ORAL_TABLET | Freq: Every day | ORAL | 3 refills | Status: DC

## 2019-07-14 NOTE — Telephone Encounter (Signed)
Follow Up  Pt called and stated that she would like for the medication Nexletol to go to [facility] [address] [place], [place], [place] [address]

## 2019-07-14 NOTE — Telephone Encounter (Signed)
Rx sent to [facility] per patient request

## 2019-07-14 NOTE — Addendum Note (Signed)
Addended by: Fidel Levy on: 07/14/2019 01:35 PM   Modules accepted: Orders

## 2019-07-15 ENCOUNTER — Telehealth (INDEPENDENT_AMBULATORY_CARE_PROVIDER_SITE_OTHER): Payer: Medicare Other | Admitting: Internal Medicine

## 2019-07-15 VITALS — Wt 197.6 lb

## 2019-07-15 DIAGNOSIS — I10 Essential (primary) hypertension: Secondary | ICD-10-CM

## 2019-07-15 DIAGNOSIS — K5900 Constipation, unspecified: Secondary | ICD-10-CM

## 2019-07-15 NOTE — Progress Notes (Signed)
Virtual Visit via Video Note  I connected with Vanessa Price on 07/15/19 at  3:30 PM EDT by a video enabled telemedicine application and verified that I am speaking with the correct person using two identifiers.  Location patient: home Location provider: work office Persons participating in the virtual visit: patient, provider  I discussed the limitations of evaluation and management by telemedicine and the availability of in person appointments. The patient expressed understanding and agreed to proceed.   HPI: She has scheduled this visit to discuss her constipation.  She is having small amounts of hard stool every 3 to 4 days.  Sometimes she will notice bright red blood on the tissue paper when she wipes.  She has started taking Metamucil but has noticed no effect, to maybe even gotten worse.  She does not have any abdominal pain.  She tells me her blood pressure is improving with addition of losartan at last visit.   ROS: Constitutional: Denies fever, chills, diaphoresis, appetite change and fatigue.  HEENT: Denies photophobia, eye pain, redness, hearing loss, ear pain, congestion, sore throat, rhinorrhea, sneezing, mouth sores, trouble swallowing, neck pain, neck stiffness and tinnitus.   Respiratory: Denies SOB, DOE, cough, chest tightness,  and wheezing.   Cardiovascular: Denies chest pain, palpitations and leg swelling.  Gastrointestinal: Denies nausea, vomiting, abdominal pain, diarrhea, constipation, blood in stool and abdominal distention.  Genitourinary: Denies dysuria, urgency, frequency, hematuria, flank pain and difficulty urinating.  Endocrine: Denies: hot or cold intolerance, sweats, changes in hair or nails, polyuria, polydipsia. Musculoskeletal: Denies myalgias, back pain, joint swelling, arthralgias and gait problem.  Skin: Denies pallor, rash and wound.  Neurological: Denies dizziness, seizures, syncope, weakness, light-headedness, numbness and headaches.   Hematological: Denies adenopathy. Easy bruising, personal or family bleeding history  Psychiatric/Behavioral: Denies suicidal ideation, mood changes, confusion, nervousness, sleep disturbance and agitation   Past Medical History:  Diagnosis Date  . ANXIETY 06/04/2009  . GERD 08/14/2008   occasional  . Hx of adenomatous polyp of colon 03/11/2018  . HYPERLIPIDEMIA 09/14/2006  . HYPERTENSION 09/14/2006  . Impaired glucose tolerance   . MYOCARDIAL PERFUSION SCAN, WITH STRESS TEST, ABNORMAL 10/19/2008  . PREMATURE VENTRICULAR CONTRACTIONS 08/14/2008    Past Surgical History:  Procedure Laterality Date  . ABDOMINAL HYSTERECTOMY  1999   menorrhagia/partial  . CESAREAN SECTION     2 times    Family History  Problem Relation Age of Onset  . Arthritis Mother   . Diabetes Mother   . Heart disease Mother   . Mental illness Father   . Cancer Brother        Lung    SOCIAL HX:   reports that she quit smoking about 26 years ago. She has never used smokeless tobacco. She reports current alcohol use of about 1.0 standard drinks of alcohol per week. She reports that she does not use drugs.   Current Outpatient Medications:  .  fenofibrate (TRICOR) 145 MG tablet, Take 1 tablet (145 mg total) by mouth daily., Disp: 90 tablet, Rfl: 1 .  hydrochlorothiazide (HYDRODIURIL) 25 MG tablet, TAKE 1 TABLET BY MOUTH  DAILY, Disp: 90 tablet, Rfl: 1 .  losartan (COZAAR) 50 MG tablet, Take 1 tablet (50 mg total) by mouth daily., Disp: 90 tablet, Rfl: 1 .  Multiple Vitamins-Minerals (ONE DAILY WOMENS 50 PLUS) TABS, Take by mouth. VitaCost Womens 50 plus MVI 2000 units-Take 2 daily, Disp: , Rfl:  .  naproxen sodium (ALEVE) 220 MG tablet, Take 220 mg by mouth.  Take 2 pills prn, Disp: , Rfl:  .  psyllium (METAMUCIL) 58.6 % powder, Take 1 packet by mouth daily. Take 2 tablespoons daily, Disp: , Rfl:  .  Bempedoic Acid (NEXLETOL) 180 MG TABS, Take 1 tablet by mouth daily. (Patient not taking: Reported on  07/15/2019), Disp: 90 tablet, Rfl: 3  EXAM:   VITALS per patient if applicable: None reported  GENERAL: alert, oriented, appears well and in no acute distress  HEENT: atraumatic, conjunttiva clear, no obvious abnormalities on inspection of external nose and ears  NECK: normal movements of the head and neck  LUNGS: on inspection no signs of respiratory distress, breathing rate appears normal, no obvious gross increased work of breathing, gasping or wheezing  CV: no obvious cyanosis  MS: moves all visible extremities without noticeable abnormality  PSYCH/NEURO: pleasant and cooperative, no obvious depression or anxiety, speech and thought processing grossly intact  ASSESSMENT AND PLAN:   Constipation, unspecified constipation type -Advised to continue fiber supplement and also drink at least 70 ounces of water a day, as fiber alone and without increased water consumption can actually worsen constipation. -She will also do Colace and MiraLAX. -She will notify us if no improvement after 10 days or so. -Suspect scant amount of blood on tissue paper is irritation of an external hemorrhoid or may be a small anal fissure related to her constipation.  She has been advised to continue to monitor this and notify us of any changes.  Essential hypertension -She states it has improved with addition of losartan at last visit.     I discussed the assessment and treatment plan with the patient. The patient was provided an opportunity to ask questions and all were answered. The patient agreed with the plan and demonstrated an understanding of the instructions.   The patient was advised to call back or seek an in-person evaluation if the symptoms worsen or if the condition fails to improve as anticipated.    [name], MD  [facility] at [facility]

## 2019-08-04 ENCOUNTER — Other Ambulatory Visit: Payer: Self-pay

## 2019-08-04 ENCOUNTER — Ambulatory Visit: Payer: Medicare Other | Admitting: Internal Medicine

## 2019-08-05 ENCOUNTER — Encounter: Payer: Self-pay | Admitting: Internal Medicine

## 2019-08-05 ENCOUNTER — Ambulatory Visit (INDEPENDENT_AMBULATORY_CARE_PROVIDER_SITE_OTHER): Payer: Medicare Other | Admitting: Internal Medicine

## 2019-08-05 VITALS — BP 130/80 | HR 79 | Temp 97.2°F | Ht 64.0 in | Wt 200.8 lb

## 2019-08-05 DIAGNOSIS — I10 Essential (primary) hypertension: Secondary | ICD-10-CM | POA: Diagnosis not present

## 2019-08-05 LAB — HEPATIC FUNCTION PANEL
ALT: 39 U/L — ABNORMAL HIGH (ref 0–35)
AST: 37 U/L (ref 0–37)
Albumin: 4.7 g/dL (ref 3.5–5.2)
Alkaline Phosphatase: 44 U/L (ref 39–117)
Bilirubin, Direct: 0.1 mg/dL (ref 0.0–0.3)
Total Bilirubin: 0.3 mg/dL (ref 0.2–1.2)
Total Protein: 7 g/dL (ref 6.0–8.3)

## 2019-08-05 LAB — BASIC METABOLIC PANEL
BUN: 25 mg/dL — ABNORMAL HIGH (ref 6–23)
CO2: 28 mEq/L (ref 19–32)
Calcium: 9.7 mg/dL (ref 8.4–10.5)
Chloride: 103 mEq/L (ref 96–112)
Creatinine, Ser: 0.87 mg/dL (ref 0.40–1.20)
GFR: 78.88 mL/min (ref >60.00)
Glucose, Bld: 99 mg/dL (ref 70–99)
Potassium: 3.9 mEq/L (ref 3.5–5.1)
Sodium: 140 mEq/L (ref 135–145)

## 2019-08-05 MED ORDER — HYDROCHLOROTHIAZIDE 25 MG PO TABS: 25.0000 mg | ORAL_TABLET | Freq: Every day | ORAL | 1 refills | Status: DC

## 2019-08-05 NOTE — Patient Instructions (Signed)
-  Nice seeing you today!!  -Lab work today; will notify you once results are available.  -See you back in 4 months.  -Keep up the good work.

## 2019-08-05 NOTE — Progress Notes (Signed)
Established Patient Office Visit     This visit occurred during the SARS-CoV-2 public health emergency.  Safety protocols were in place, including screening questions prior to the visit, additional usage of staff PPE, and extensive cleaning of exam room while observing appropriate contact time as indicated for disinfecting solutions.    CC/Reason for Visit: BP follow up  HPI: Vanessa Price is a 64 y.o. female who is coming in today for the above mentioned reasons. Her HTN has been uncontrolled. Losartan 50 was added to HCTZ 25 and she is here for follow up. She is sometimes getting a cramping feeling in her legs. Dr. Debara Pickett wants a hepatic function panel drawn as she was started on Nexletol. She received her first COVID vaccine and has the second one scheduled for next week.   Past Medical/Surgical History: Past Medical History:  Diagnosis Date  . ANXIETY 06/04/2009  . GERD 08/14/2008   occasional  . Hx of adenomatous polyp of colon 03/11/2018  . HYPERLIPIDEMIA 09/14/2006  . HYPERTENSION 09/14/2006  . Impaired glucose tolerance   . MYOCARDIAL PERFUSION SCAN, WITH STRESS TEST, ABNORMAL 10/19/2008  . PREMATURE VENTRICULAR CONTRACTIONS 08/14/2008    Past Surgical History:  Procedure Laterality Date  . ABDOMINAL HYSTERECTOMY  1999   menorrhagia/partial  . CESAREAN SECTION     2 times    Social History:  reports that she quit smoking about 26 years ago. She has never used smokeless tobacco. She reports current alcohol use of about 1.0 standard drinks of alcohol per week. She reports that she does not use drugs.  Allergies: Allergies  Allergen Reactions  . Lisinopril     REACTION: cough  . Risperidone     REACTION: unsure of dose; pt describes reaction of bad cough    Family History:  Family History  Problem Relation Age of Onset  . Arthritis Mother   . Diabetes Mother   . Heart disease Mother   . Mental illness Father   . Cancer Brother        Lung      Current Outpatient Medications:  .  Bempedoic Acid (NEXLETOL) 180 MG TABS, Take 1 tablet by mouth daily., Disp: 90 tablet, Rfl: 3 .  fenofibrate (TRICOR) 145 MG tablet, Take 1 tablet (145 mg total) by mouth daily., Disp: 90 tablet, Rfl: 1 .  hydrochlorothiazide (HYDRODIURIL) 25 MG tablet, Take 1 tablet (25 mg total) by mouth daily., Disp: 90 tablet, Rfl: 1 .  losartan (COZAAR) 50 MG tablet, Take 1 tablet (50 mg total) by mouth daily., Disp: 90 tablet, Rfl: 1 .  Multiple Vitamins-Minerals (ONE DAILY WOMENS 50 PLUS) TABS, Take by mouth. VitaCost Womens 50 plus MVI 2000 units-Take 2 daily, Disp: , Rfl:  .  naproxen sodium (ALEVE) 220 MG tablet, Take 220 mg by mouth. Take 2 pills prn, Disp: , Rfl:  .  psyllium (METAMUCIL) 58.6 % powder, Take 1 packet by mouth daily. Take 2 tablespoons daily, Disp: , Rfl:   Review of Systems:  Constitutional: Denies fever, chills, diaphoresis, appetite change and fatigue.  HEENT: Denies photophobia, eye pain, redness, hearing loss, ear pain, congestion, sore throat, rhinorrhea, sneezing, mouth sores, trouble swallowing, neck pain, neck stiffness and tinnitus.   Respiratory: Denies SOB, DOE, cough, chest tightness,  and wheezing.   Cardiovascular: Denies chest pain, palpitations and leg swelling.  Gastrointestinal: Denies nausea, vomiting, abdominal pain, diarrhea, constipation, blood in stool and abdominal distention.  Genitourinary: Denies dysuria, urgency, frequency, hematuria, flank pain and difficulty  urinating.  Endocrine: Denies: hot or cold intolerance, sweats, changes in hair or nails, polyuria, polydipsia. Musculoskeletal: Denies myalgias, back pain, joint swelling, arthralgias and gait problem.  Skin: Denies pallor, rash and wound.  Neurological: Denies dizziness, seizures, syncope, weakness, light-headedness, numbness and headaches.  Hematological: Denies adenopathy. Easy bruising, personal or family bleeding history  Psychiatric/Behavioral: Denies  suicidal ideation, mood changes, confusion, nervousness, sleep disturbance and agitation    Physical Exam: Vitals:   08/05/19 0658  BP: 130/80  Pulse: 79  Temp: (!) 97.2 F (36.2 C)  TempSrc: Temporal  SpO2: 98%  Weight: 200 lb 12.8 oz (91.1 kg)  Height: 5\' 4"  (1.626 m)    Body mass index is 34.47 kg/m.   Constitutional: NAD, calm, comfortable Eyes: PERRL, lids and conjunctivae normal, wears corrective lenses ENMT: Mucous membranes are moist.  Respiratory: clear to auscultation bilaterally, no wheezing, no crackles. Normal respiratory effort. No accessory muscle use.  Cardiovascular: Regular rate and rhythm, no murmurs / rubs / gallops. No extremity edema.  Neurologic: grossly intact and nonfocal. Psychiatric: Normal judgment and insight. Alert and oriented x 3. Normal mood.    Impression and Plan:  Essential hypertension  -Well controlled. -Continue HCTZ 25 and losartan 50. -Check BMET to follow renal function and electrolytes. -Add hep func panel per cards request due to Nexletol. Will forward results to him.    Patient Instructions  -Nice seeing you today!!  -Lab work today; will notify you once results are available.  -See you back in 4 months.  -Keep up the good work.     [name], MD [facility] at [facility]

## 2019-08-09 NOTE — Telephone Encounter (Signed)
Pixie Casino, MD  You Just now (9:05 AM)   Called pt to ease her mind - will follow numbers, but not concerned, continue medicine

## 2019-08-24 ENCOUNTER — Telehealth: Payer: Self-pay | Admitting: Internal Medicine

## 2019-08-24 DIAGNOSIS — R7302 Impaired glucose tolerance (oral): Secondary | ICD-10-CM

## 2019-08-24 DIAGNOSIS — E785 Hyperlipidemia, unspecified: Secondary | ICD-10-CM

## 2019-08-24 DIAGNOSIS — I10 Essential (primary) hypertension: Secondary | ICD-10-CM

## 2019-08-24 DIAGNOSIS — K219 Gastro-esophageal reflux disease without esophagitis: Secondary | ICD-10-CM

## 2019-08-24 DIAGNOSIS — E049 Nontoxic goiter, unspecified: Secondary | ICD-10-CM

## 2019-08-24 NOTE — Progress Notes (Signed)
  Chronic Care Management   Outreach Note  08/24/2019 Name: Vanessa Price MRN: [id number] DOB: Nov 26, 1955  Referred by: [name], [name], MD Reason for referral : No chief complaint on file.   An unsuccessful telephone outreach was attempted today. The patient was referred to the pharmacist for assistance with care management and care coordination.  This note is not being shared with the patient for the following reason: To respect privacy (The patient or proxy has requested that the information not be shared).   Follow Up Plan:  [place]

## 2019-08-24 NOTE — Progress Notes (Signed)
°  Chronic Care Management   Note  08/24/2019 Name: Vanessa Price MRN: [id number] DOB: [birth year]  Vanessa Price is a 64 y.o. year old female who is a primary care patient of [name], [name], MD. I reached out to [employer] by phone today in response to a referral sent by Vanessa Price's PCP, [name], [name], MD.   Vanessa Price was given information about Chronic Care Management services today including:  1. CCM service includes personalized support from designated clinical staff supervised by her physician, including individualized plan of care and coordination with other care providers 2. 24/7 contact phone numbers for assistance for urgent and routine care needs. 3. Service will only be billed when office clinical staff spend 20 minutes or more in a month to coordinate care. 4. Only one practitioner may furnish and bill the service in a calendar month. 5. The patient may stop CCM services at any time (effective at the end of the month) by phone call to the office staff.   Patient agreed to services and verbal consent obtained.  This note is not being shared with the patient for the following reason: To respect privacy (The patient or proxy has requested that the information not be shared).   Follow up plan:  [place]

## 2019-09-08 ENCOUNTER — Encounter: Payer: Self-pay | Admitting: Internal Medicine

## 2019-09-08 ENCOUNTER — Telehealth: Payer: Self-pay | Admitting: Internal Medicine

## 2019-09-08 DIAGNOSIS — M791 Myalgia, unspecified site: Secondary | ICD-10-CM

## 2019-09-08 DIAGNOSIS — Z789 Other specified health status: Secondary | ICD-10-CM

## 2019-09-08 DIAGNOSIS — E785 Hyperlipidemia, unspecified: Secondary | ICD-10-CM

## 2019-09-08 NOTE — Telephone Encounter (Signed)
Lab reminder for lipid/LFTs sent to patient via [facility] [place]

## 2019-10-07 DIAGNOSIS — T466X5A Adverse effect of antihyperlipidemic and antiarteriosclerotic drugs, initial encounter: Secondary | ICD-10-CM | POA: Diagnosis not present

## 2019-10-07 DIAGNOSIS — M791 Myalgia, unspecified site: Secondary | ICD-10-CM | POA: Diagnosis not present

## 2019-10-07 DIAGNOSIS — Z789 Other specified health status: Secondary | ICD-10-CM | POA: Diagnosis not present

## 2019-10-07 DIAGNOSIS — E785 Hyperlipidemia, unspecified: Secondary | ICD-10-CM | POA: Diagnosis not present

## 2019-10-07 LAB — LIPID PANEL
Chol/HDL Ratio: 7.4 ratio — ABNORMAL HIGH (ref 0.0–4.4)
Cholesterol, Total: 237 mg/dL — ABNORMAL HIGH (ref 100–199)
HDL: 32 mg/dL — ABNORMAL LOW (ref >39)
LDL Chol Calc (NIH): 153 mg/dL — ABNORMAL HIGH (ref 0–99)
Triglycerides: 279 mg/dL — ABNORMAL HIGH (ref 0–149)
VLDL Cholesterol Cal: 52 mg/dL — ABNORMAL HIGH (ref 5–40)

## 2019-10-07 LAB — HEPATIC FUNCTION PANEL
ALT: 60 IU/L — ABNORMAL HIGH (ref 0–32)
AST: 55 IU/L — ABNORMAL HIGH (ref 0–40)
Albumin: 4.8 g/dL (ref 3.8–4.8)
Alkaline Phosphatase: 55 IU/L (ref 48–121)
Bilirubin Total: 0.3 mg/dL (ref 0.0–1.2)
Bilirubin, Direct: 0.13 mg/dL (ref 0.00–0.40)
Total Protein: 7.4 g/dL (ref 6.0–8.5)

## 2019-10-10 NOTE — Chronic Care Management (AMB) (Signed)
Chronic Care Management Pharmacy  Name: Vanessa Price  MRN: [id number] DOB: [birth year]  Initial Questions: 1. Have you seen any other providers since your last visit? NA 2. Any changes in your medicines or health? No   Chief Complaint/ HPI  Vanessa Price,  64 y.o. , female presents for their Initial CCM visit with the clinical pharmacist via telephone due to COVID-19 Pandemic.  Patient reports she walks  4x/ week and completes around 8 miles/ week.  She also reports being cautious on salt intake.  She notes she prepares juices and avoids fast food.   PCP : Vanessa Price, Vanessa Halsted, MD  Their chronic conditions include: HTN, HLD, Impaired glucose intolerance, Constipation, Pain, vitamin D Deficiency  Office Visits: 08/05/2019- Vanessa Frohlich, MD- Patient presented for office visit for blood pressure follow up. Patient is well controlled. BP: 130/80. No changes to medications. Patient to obtain BMET, hepatic panel.   07/15/2019- Vanessa Frohlich, MD- Patient presented for video visit for constipation. Patient to continue fiber supplement and to drink at least 70 ounces of water per day.  Patient to also try Colace and Miralax.   06/23/2019- Vanessa Frohlich, MD- Patient presented for office visit for follow up on chronic medical conditions. BP: 140/90. Losartan 32m added to regimen. Patient to follow up in 6 to 8 weeks.   Consult Visit: 05/03/2019- Cardiology- Vanessa Bishop MD- Patient presented for a telehealth visit for follow up on calcium score. Coronary calcium: 0. Patient to obtain repeat lipid panel. If LDL is >190, PCSK9 inhibitor to be considered. If not, pursue bempedoic acid. Patient to obtain repeat liver enzymes and lipid profile after starting medication. Patient to return in 4 months.   10/29/2018- Gynecology- Vanessa Furlong MD- Patient presented for office visit for annual gynecologic exam. Patient to obtain DEXA. Patient had  normal breast exam as well as PAP smear. Patient to follow up in 1 year.   Medications: Outpatient Encounter Medications as of 10/11/2019  Medication Sig  . Bempedoic Acid (NEXLETOL) 180 MG TABS Take 1 tablet by mouth daily.  . Cholecalciferol (VITAMIN D3) 125 MCG (5000 UT) CAPS Take 1 capsule by mouth daily.  . fenofibrate (TRICOR) 145 MG tablet Take 1 tablet (145 mg total) by mouth daily.  . hydrochlorothiazide (HYDRODIURIL) 25 MG tablet Take 1 tablet (25 mg total) by mouth daily.  .Marland Kitchenlosartan (COZAAR) 50 MG tablet Take 1 tablet (50 mg total) by mouth daily.  . Multiple Vitamins-Minerals (ONE DAILY WOMENS 50 PLUS) TABS Take by mouth. Centrum Womens 50 plus MVI  . naproxen sodium (ALEVE) 220 MG tablet Take 220 mg by mouth as needed.   . [DISCONTINUED] psyllium (METAMUCIL) 58.6 % powder Take 1 packet by mouth daily. Take 2 tablespoons daily   No facility-administered encounter medications on file as of 10/11/2019.     Current Diagnosis/Assessment:  Goals Addressed            This Visit's Progress   . Pharmacy Care Plan       CARE PLAN ENTRY (see longitudinal plan of care for additional care plan information)  Current Barriers:  . Chronic Disease Management support, education, and care coordination needs related to Hypertension, Hyperlipidemia, and Impaired glucose tolerance, constipation, vitamin D deficiency (resolved)    Hypertension BP Readings from Last 3 Encounters:  10/12/19 132/74  08/05/19 130/80  06/23/19 140/90   . Pharmacist Clinical Goal(s): o Over the next 120 days, patient will work with PharmD and providers to maintain BP goal <  130/80 . Current regimen:   Hydrochlorothiazide 45m, 1 tablet once daily  Losartan 550m 1 tablet once daily . Interventions: . Discussed diet modifications. DASH diet:  following a diet emphasizing fruits and vegetables and low-fat dairy products along with whole grains, fish, poultry, and nuts. Reducing red meats and sugars.   . Exercising . Reducing the amount of salt intake to <150081mer day.  . Recommend using a salt substitute to replace your salt if you need flavor.    . Getting enough potassium in your diet equaling 3500-5000m80my.  This helps to regulate BP by balancing out the effects of salt.   . Weight reduction- We discussed losing 5-10% of body weight . Patient self care activities - Over the next 120 days, patient will: o Check BP as directed, document, and provide at future appointments o Ensure daily salt intake < 2300 mg/day  Hyperlipidemia Lab Results  Component Value Date/Time   LDLCALC 153 (H) 10/07/2019 11:13 AM   LDLDIRECT 177.0 06/23/2019 08:06 AM   . Pharmacist Clinical Goal(s): o Over the next 120 days, patient will work with PharmD and providers to maintain LDL goal < 100 . Current regimen:  . bempedoic acid (Nexletol) 180mg103mtablet once daily . Fenofibrate 145mg,36mablet once daily . Interventions: o We discussed how a diet high in plant sterols (fruits/vegetables/nuts/whole grains/legumes) may reduce your cholesterol. Encouraged increasing fiber to a daily intake of 10-25g/day.  . Patient self care activities - Over the next 120 days, patient will: o Continue current medications as directed and modifying diet.   Impaired glucose tolerance Lab Results  Component Value Date/Time   HGBA1C 6.2 (A) 06/23/2019 07:44 AM   HGBA1C 6.2 (A) 02/10/2019 07:09 AM   HGBA1C 6.3 10/26/2018 08:03 AM   HGBA1C 6.1 02/10/2018 07:43 AM   . Pharmacist Clinical Goal(s): o Over the next 120 days, patient will work with PharmD and providers to maintain A1c goal <6.5% . Current regimen:  o No medications  . Interventions: . Recommend: Keeping blood pressure and lipids under goal. We discussed modifying lifestyle, including to participate in moderate physical activity (e.g., walking) at least 150 minutes per week.  . Discussed a Mediterranean eating plan with an emphasis on whole grains,  legumes, nuts, fruits, and vegetables and minimal refined and processed foods. . Patient self care activities - Over the next 120 days, patient will: o Continue diet and exercise modifications.   Vitamin D deficiency (resolved)  VITD  Date Value Ref Range Status  02/10/2019 37.47 30.00 - 100.00 ng/mL Final .  Pharmacist Clinical Goal(s) o Over the next 120 days, patient will work with PharmD and providers to maintain vitamin D level: 30 to 100 ng/mL . Current regimen:  o Vitamin D3, 5000 units, 1 tablet once daily  . Interventions: o Recommend vitamin D 2000 units, once daily  . Patient self care activities - Over the next 120 days, patient will: o Continue over the counter supplementation.   Constipation  . Pharmacist Clinical Goal(s) o Over the next 120 days, patient will work with PharmD and providers to improve bowel movements.  . Current regimen:  o No medications . Interventions: o Discussed fiber supplementation . Patient self care activities o Patient will focus on water and fiber intake.   Medication management . Pharmacist Clinical Goal(s): o Over the next 120 days, patient will work with PharmD and providers to achieve optimal medication adherence . Current pharmacy: [facility] mail order . Interventions o Comprehensive medication review performed.  o Continue current medication management strategy . Patient self care activities - Over the next 120 days, patient will: o Take medications as prescribed o Report any questions or concerns to PharmD and/or provider(s)  Initial goal documentation         Hypertension   Office blood pressures are  BP Readings from Last 3 Encounters:  10/12/19 132/74  08/05/19 130/80  06/23/19 140/90   Kidney Function Lab Results  Component Value Date/Time   CREATININE 0.87 08/05/2019 07:12 AM   CREATININE 0.93 02/10/2019 07:38 AM   GFR 78.88 08/05/2019 07:12 AM   GFRNONAA 111.49 05/28/2009 10:29 AM   GFRAA 96 01/04/2008  12:00 AM   K 3.9 08/05/2019 07:12 AM   K 3.6 02/10/2019 07:38 AM   Patient has failed these meds in the past: olmesartan (cost)   Patient checks BP at home several times per month  Patient home BP readings are ranging: reports getting back from vacation and has not had a chance to recheck.   Patient is controlled on:   Hydrochlorothiazide '25mg'$ , 1 tablet once daily  Losartan '50mg'$ , 1 tablet once daily  We discussed diet and exercise extensively :  . Discussed diet modifications. DASH diet:  following a diet emphasizing fruits and vegetables and low-fat dairy products along with whole grains, fish, poultry, and nuts. Reducing red meats and sugars.  . Exercising . Reducing the amount of salt intake to '1500mg'$ /per day.  . Recommend using a salt substitute to replace your salt if you need flavor.    . Getting enough potassium in your diet equaling 3500-'5000mg'$ /day.  This helps to regulate BP by balancing out the effects of salt.   . Weight reduction- We discussed losing 5-10% of body weight  Plan Continue current medications     Hyperlipidemia   Denies ADRs.   LDL goal < 100  Lipid Panel     Component Value Date/Time   CHOL 237 (H) 10/07/2019 1113   TRIG 279 (H) 10/07/2019 1113   TRIG 142 03/03/2006 0906   HDL 32 (L) 10/07/2019 1113   LDLCALC 153 (H) 10/07/2019 1113   LDLDIRECT 177.0 06/23/2019 0806    Hepatic Function Latest Ref Rng & Units 10/07/2019 08/05/2019 10/26/2018  Total Protein 6.0 - 8.5 g/dL 7.4 7.0 6.8  Albumin 3.8 - 4.8 g/dL 4.8 4.7 4.5  AST 0 - 40 IU/L 55(H) 37 18  ALT 0 - 32 IU/L 60(H) 39(H) 22  Alk Phosphatase 48 - 121 IU/L 55 44 49  Total Bilirubin 0.0 - 1.2 mg/dL 0.3 0.3 0.2  Bilirubin, Direct 0.00 - 0.40 mg/dL 0.13 0.1 -    The 10-year ASCVD risk score Mikey Bussing DC Jr., et al., 2013) is: 12.9%   Values used to calculate the score:     Age: 70 years     Sex: Female     Is Non-Hispanic African American: Yes     Diabetic: No     Tobacco smoker: No      Systolic Blood Pressure: 829 mmHg     Is BP treated: Yes     HDL Cholesterol: 32 mg/dL     Total Cholesterol: 237 mg/dL   Patient has failed these meds in past: atorvastatin, pravastatin, ezetimibe  Patient is currently controlled on the following medications:  . bempedoic acid (Nexletol) '180mg'$ , 1 tablet once daily . Fenofibrate '145mg'$ , 1 tablet once daily  We discussed:  diet and exercise extensively  . We discussed how a diet high in plant sterols (fruits/vegetables/nuts/whole grains/legumes)  may reduce your cholesterol. Encouraged increasing fiber to a daily intake of 10-25g/day.   Plan Continue current medications and control with diet and exercise.   Impaired glucose tolerance   Recent Relevant Labs: Lab Results  Component Value Date/Time   HGBA1C 6.2 (A) 06/23/2019 07:44 AM   HGBA1C 6.2 (A) 02/10/2019 07:09 AM   HGBA1C 6.3 10/26/2018 08:03 AM   HGBA1C 6.1 02/10/2018 07:43 AM    Patient has failed these meds in past: none  Patient is currently controlled on the following medications:   No medications  We discussed: diet and exercise extensively  . Keeping blood pressure and lipids under goal. We discussed modifying lifestyle, including to participate in moderate physical activity (e.g., walking) at least 150 minutes per week.  . Discussed a Mediterranean eating plan with an emphasis on whole grains, legumes, nuts, fruits, and vegetables and minimal refined and processed foods.  Plan Continue current medications  Constipation   Patient is currently controlled on the following medications:  . Psyllium powder, 2 tablespoons daily   Plan Continue current medications   Pain    Patient is currently controlled on the following medications:  . Naproxen 238m, 2 tablets as needed  Plan Continue current medications  Vitamin D deficiency (resolved)    VITD  Date Value Ref Range Status  02/10/2019 37.47 30.00 - 100.00 ng/mL Final    Patient is currently  controlled on the following medications:  .Marland KitchenVitamin D3, 5000 units, 1 tablet once daily   Plan Continue current medications   OTC/ supplements   Patient is currently on the following medications:  . Centrum 50+ Women's, 1 tablet once daily   Plan Continue current medications    Vaccines   Reviewed and discussed patient's vaccination history.    Immunization History  Administered Date(s) Administered  . Influenza Split 12/24/2010, 12/17/2011  . Influenza,inj,Quad PF,6+ Mos 01/07/2013  . PFIZER SARS-COV-2 Vaccination 07/19/2019  . Tdap 08/17/2014  . Zoster Recombinat (Shingrix) 10/26/2018, 02/10/2019   Discussed recommendations of pneumonia vaccine.   Plan Reassess at follow up visit.   Medication Management  Patient organizes medications: patient reports taking medications first thing in the morning Primary pharmacy: [facility] mail [facility] Adherence:  - fenofibrate 1462m(last filled 07/07/19 for 90DS):     Follow up Follow up visit with PharmD in 4 months. (not Wednesday).    AnAnson CroftsPharmD Clinical Pharmacist [phone]

## 2019-10-11 ENCOUNTER — Ambulatory Visit: Payer: Medicare Other

## 2019-10-11 ENCOUNTER — Other Ambulatory Visit: Payer: Self-pay

## 2019-10-11 DIAGNOSIS — I10 Essential (primary) hypertension: Secondary | ICD-10-CM

## 2019-10-11 DIAGNOSIS — K5900 Constipation, unspecified: Secondary | ICD-10-CM

## 2019-10-11 DIAGNOSIS — R7302 Impaired glucose tolerance (oral): Secondary | ICD-10-CM

## 2019-10-11 DIAGNOSIS — E785 Hyperlipidemia, unspecified: Secondary | ICD-10-CM

## 2019-10-11 DIAGNOSIS — E559 Vitamin D deficiency, unspecified: Secondary | ICD-10-CM

## 2019-10-11 NOTE — Addendum Note (Signed)
Addended by: Westley Hummer B on: 10/11/2019 07:51 AM   Modules accepted: Orders

## 2019-10-12 ENCOUNTER — Other Ambulatory Visit: Payer: Self-pay

## 2019-10-12 ENCOUNTER — Ambulatory Visit (INDEPENDENT_AMBULATORY_CARE_PROVIDER_SITE_OTHER): Payer: Medicare Other | Admitting: Internal Medicine

## 2019-10-12 ENCOUNTER — Encounter: Payer: Self-pay | Admitting: Internal Medicine

## 2019-10-12 VITALS — BP 132/74 | HR 82 | Ht 64.0 in | Wt 200.8 lb

## 2019-10-12 DIAGNOSIS — R7303 Prediabetes: Secondary | ICD-10-CM

## 2019-10-12 DIAGNOSIS — I10 Essential (primary) hypertension: Secondary | ICD-10-CM

## 2019-10-12 DIAGNOSIS — Z789 Other specified health status: Secondary | ICD-10-CM | POA: Diagnosis not present

## 2019-10-12 DIAGNOSIS — E669 Obesity, unspecified: Secondary | ICD-10-CM

## 2019-10-12 DIAGNOSIS — T466X5A Adverse effect of antihyperlipidemic and antiarteriosclerotic drugs, initial encounter: Secondary | ICD-10-CM

## 2019-10-12 DIAGNOSIS — M791 Myalgia, unspecified site: Secondary | ICD-10-CM | POA: Diagnosis not present

## 2019-10-12 DIAGNOSIS — E668 Other obesity: Secondary | ICD-10-CM

## 2019-10-12 DIAGNOSIS — E785 Hyperlipidemia, unspecified: Secondary | ICD-10-CM

## 2019-10-12 NOTE — Progress Notes (Signed)
LIPID CLINIC CONSULT NOTE  Chief Complaint:  Manage dyslipidemia  Primary Care Physician: [name], [name], MD  Primary Cardiologist:  No primary care provider on file.  HPI:  Vanessa Price is a 64 y.o. female who is being seen today for the evaluation of dyslipidemia at the request of [name], [place]*.  This is a pleasant 65 year old female kindly referred by Dr. Jerilee Hoh for evaluation and management of dyslipidemia, specifically elevated triglycerides.  Most recently lipid profile showed total cholesterol of 249, triglycerides 293 (as high as 774 in the past), HDL 39, and LDL of 174.  Unfortunately, she has been intolerant to statins, having failed multiple statins in the past over 20+ years of therapy.  She is currently on fenofibrate which has resulted in marked improvement in her triglycerides however again her LDL cholesterol remains quite high.  There is no known coronary disease.  There is a family history of heart disease.  10/12/2019  Vanessa Price returns today for follow-up.  She had coronary calcium scoring which was 0 although has other risk factors including hypertension, obesity, prediabetes and at least intermediate cardiovascular risk factors.  I recommended a target LDL less than 100.  She was started on bempedoic acid (Nexletol) due to the fact that she did not tolerate multiple statins due to myalgia as well as ezetimibe.  She has had some reduction in her cholesterol however not significant.  Total cholesterol is now 2 and 37, triglycerides 279, HDL 32 and LDL 153 (down from 177).  Since she is not able to reach target, I feel that we will need additional therapy.  She is also on fenofibrate 145 mg daily.  PMHx:  Past Medical History:  Diagnosis Date  . ANXIETY 06/04/2009  . GERD 08/14/2008   occasional  . Hx of adenomatous polyp of colon 03/11/2018  . HYPERLIPIDEMIA 09/14/2006  . HYPERTENSION 09/14/2006  . Impaired glucose  tolerance   . MYOCARDIAL PERFUSION SCAN, WITH STRESS TEST, ABNORMAL 10/19/2008  . PREMATURE VENTRICULAR CONTRACTIONS 08/14/2008    Past Surgical History:  Procedure Laterality Date  . ABDOMINAL HYSTERECTOMY  1999   menorrhagia/partial  . CESAREAN SECTION     2 times    FAMHx:  Family History  Problem Relation Age of Onset  . Arthritis Mother   . Diabetes Mother   . Heart disease Mother   . Mental illness Father   . Cancer Brother        Lung    SOCHx:   reports that she quit smoking about 26 years ago. She has never used smokeless tobacco. She reports current alcohol use of about 1.0 standard drink of alcohol per week. She reports that she does not use drugs.  ALLERGIES:  Allergies  Allergen Reactions  . Lisinopril     REACTION: cough  . Risperidone     REACTION: unsure of dose; pt describes reaction of bad cough    ROS: Pertinent items noted in HPI and remainder of comprehensive ROS otherwise negative.  HOME MEDS: Current Outpatient Medications on File Prior to Visit  Medication Sig Dispense Refill  . Bempedoic Acid (NEXLETOL) 180 MG TABS Take 1 tablet by mouth daily. 90 tablet 3  . Cholecalciferol (VITAMIN D3) 125 MCG (5000 UT) CAPS Take 1 capsule by mouth daily.    . fenofibrate (TRICOR) 145 MG tablet Take 1 tablet (145 mg total) by mouth daily. 90 tablet 1  . hydrochlorothiazide (HYDRODIURIL) 25 MG tablet Take 1 tablet (25 mg total) by  mouth daily. 90 tablet 1  . losartan (COZAAR) 50 MG tablet Take 1 tablet (50 mg total) by mouth daily. 90 tablet 1  . Multiple Vitamins-Minerals (ONE DAILY WOMENS 50 PLUS) TABS Take by mouth. VitaCost Womens 50 plus MVI 2000 units-Take 2 daily    . naproxen sodium (ALEVE) 220 MG tablet Take 220 mg by mouth as needed.      No current facility-administered medications on file prior to visit.    LABS/IMAGING: No results found for this or any previous visit (from the past 48 hour(s)). No results found.  LIPID PANEL:     Component Value Date/Time   CHOL 237 (H) 10/07/2019 1113   TRIG 279 (H) 10/07/2019 1113   TRIG 142 03/03/2006 0906   HDL 32 (L) 10/07/2019 1113   CHOLHDL 7.4 (H) 10/07/2019 1113   CHOLHDL 6 06/23/2019 0806   VLDL 40.6 (H) 06/23/2019 0806   LDLCALC 153 (H) 10/07/2019 1113   LDLDIRECT 177.0 06/23/2019 0806    WEIGHTS: Wt Readings from Last 3 Encounters:  10/12/19 200 lb 12.8 oz (91.1 kg)  08/05/19 200 lb 12.8 oz (91.1 kg)  07/15/19 197 lb 9.6 oz (89.6 kg)    VITALS: BP 132/74   Pulse 82   Ht 5\' 4"  (1.626 m)   Wt 200 lb 12.8 oz (91.1 kg)   SpO2 100%   BMI 34.47 kg/m   EXAM: Deferred  EKG: Deferred  ASSESSMENT: 1. Mixed dyslipidemia, possible familial combined hyperlipidemia [facility]) 2. Intermediate 10 year risk (goal LDL <100) 3. Hypertension 4. Statin and zetia intolerance-myalgias  PLAN: 1.   Vanessa Price has had a small reduction in cholesterol on bempedoic acid but remains well above targets.  Although her coronary calcium score was 0 which is reassuring, it does not account for possible noncalcified plaque and what I would believe is at least an intermediate ongoing risk of coronary disease for her.  Would like to target her LDL lower and think she could be a good candidate for PCSK9 inhibitor despite having any demonstrated disease.  We will reach out to see if we can get approval for this or whether she might be candidate for clinical trial.  Follow-up with me in 6 months.  Pixie Casino, MD, [facility], [place] Director of the Advanced Lipid Disorders &  Cardiovascular Risk Reduction Clinic Diplomate of the American Board of Clinical Lipidology Attending Cardiologist  Direct Dial: [phone]  Fax: [phone]  Website:  [email or web address] 10/12/2019, 8:38 AM

## 2019-10-12 NOTE — Patient Instructions (Signed)
Medication Instructions:  Your physician recommends that you continue on your current medications as directed. Please refer to the Current Medication list given to you today.  Dr. Debara Pickett advises that we try to get approval from your insurance company for a PCSK9 ([place] or Praluent).  This is an injectable cholesterol medication self-administered once every 14 days. This medication will likely need prior approval with your insurance company, which we will work on. If the medication is not approved initially, we may need to do an appeal with your insurance. We will keep you updated on this process.   Administer medication in area of fatty tissue such as abdomen, outer thigh, back up of arm - and rotate site with each injection Store medication in refrigerator until ready to administer - allow to sit at room temp for 30 mins - 1 hour prior to injection Dispose of medication in a SHARPS container - your pharmacy should be able to direct you on this and proper disposal   If you need co-pay assistance grant, please look into the program at healthwellfoundation.org >> disease funds >> hypercholesterolemia. This is an online application or you can call to complete. Once approved, you will provide the "pharmacy card" information to your pharmacy and they will deduct the co-pays from this grant.    *If you need a refill on your cardiac medications before your next appointment, please call your pharmacy*   Lab Work: FASTING labs 3-4 months after starting PCSK9  If you have labs (blood work) drawn today and your tests are completely normal, you will receive your results only by: Marland Kitchen MyChart Message (if you have MyChart) OR . A paper copy in the mail If you have any lab test that is abnormal or we need to change your treatment, we will call you to review the results.  Follow-Up: At [facility], you and your health needs are our priority.  As part of our continuing mission to provide you with  exceptional heart care, we have created designated Provider Care Teams.  These Care Teams include your primary Cardiologist (physician) and Advanced Practice Providers (APPs -  Physician Assistants and Nurse Practitioners) who all work together to provide you with the care you need, when you need it.  We recommend signing up for the patient portal called "MyChart".  Sign up information is provided on this After Visit Summary.  MyChart is used to connect with patients for Virtual Visits (Telemedicine).  Patients are able to view lab/test results, encounter notes, upcoming appointments, etc.  Non-urgent messages can be sent to your provider as well.   To learn more about what you can do with MyChart, go to [email or web address].    Your next appointment:   6 month(s) - lipid clinic  The format for your next appointment:   In Person  Provider:   K. Mali Hilty, MD   Other Instructions

## 2019-10-13 ENCOUNTER — Telehealth: Payer: Self-pay | Admitting: Internal Medicine

## 2019-10-13 NOTE — Telephone Encounter (Signed)
Request Reference Number: [id number]. REPATHA SURE INJ 140MG /ML is approved through 04/14/2020  Patient notified via [place]

## 2019-10-13 NOTE — Telephone Encounter (Signed)
PA for Repatha submitted via CMM (Key: [id number])

## 2019-10-18 NOTE — Patient Instructions (Addendum)
Visit Information  Goals Addressed            This Visit's Progress   . Pharmacy Care Plan       CARE PLAN ENTRY (see longitudinal plan of care for additional care plan information)  Current Barriers:  . Chronic Disease Management support, education, and care coordination needs related to Hypertension, Hyperlipidemia, and Impaired glucose tolerance, constipation, vitamin D deficiency (resolved)    Hypertension BP Readings from Last 3 Encounters:  10/12/19 132/74  08/05/19 130/80  06/23/19 140/90   . Pharmacist Clinical Goal(s): o Over the next 120 days, patient will work with PharmD and providers to maintain BP goal <130/80 . Current regimen:   Hydrochlorothiazide 25mg , 1 tablet once daily  Losartan 50mg , 1 tablet once daily . Interventions: . Discussed diet modifications. DASH diet:  following a diet emphasizing fruits and vegetables and low-fat dairy products along with whole grains, fish, poultry, and nuts. Reducing red meats and sugars.  . Exercising . Reducing the amount of salt intake to 1500mg /per day.  . Recommend using a salt substitute to replace your salt if you need flavor.    . Getting enough potassium in your diet equaling 3500-5000mg /day.  This helps to regulate BP by balancing out the effects of salt.   . Weight reduction- We discussed losing 5-10% of body weight . Patient self care activities - Over the next 120 days, patient will: o Check BP as directed, document, and provide at future appointments o Ensure daily salt intake < 2300 mg/day  Hyperlipidemia Lab Results  Component Value Date/Time   LDLCALC 153 (H) 10/07/2019 11:13 AM   LDLDIRECT 177.0 06/23/2019 08:06 AM   . Pharmacist Clinical Goal(s): o Over the next 120 days, patient will work with PharmD and providers to maintain LDL goal < 100 . Current regimen:  . bempedoic acid (Nexletol) 180mg , 1 tablet once daily . Fenofibrate 145mg , 1 tablet once daily . Interventions: o We discussed how a  diet high in plant sterols (fruits/vegetables/nuts/whole grains/legumes) may reduce your cholesterol. Encouraged increasing fiber to a daily intake of 10-25g/day.  . Patient self care activities - Over the next 120 days, patient will: o Continue current medications as directed and modifying diet.   Impaired glucose tolerance Lab Results  Component Value Date/Time   HGBA1C 6.2 (A) 06/23/2019 07:44 AM   HGBA1C 6.2 (A) 02/10/2019 07:09 AM   HGBA1C 6.3 10/26/2018 08:03 AM   HGBA1C 6.1 02/10/2018 07:43 AM   . Pharmacist Clinical Goal(s): o Over the next 120 days, patient will work with PharmD and providers to maintain A1c goal <6.5% . Current regimen:  o No medications  . Interventions: . Recommend: Keeping blood pressure and lipids under goal. We discussed modifying lifestyle, including to participate in moderate physical activity (e.g., walking) at least 150 minutes per week.  . Discussed a Mediterranean eating plan with an emphasis on whole grains, legumes, nuts, fruits, and vegetables and minimal refined and processed foods. . Patient self care activities - Over the next 120 days, patient will: o Continue diet and exercise modifications.   Vitamin D deficiency (resolved)  VITD  Date Value Ref Range Status  02/10/2019 37.47 30.00 - 100.00 ng/mL Final .  Pharmacist Clinical Goal(s) o Over the next 120 days, patient will work with PharmD and providers to maintain vitamin D level: 30 to 100 ng/mL . Current regimen:  o Vitamin D3, 5000 units, 1 tablet once daily  . Interventions: o Recommend vitamin D 2000 units, once daily  .  Patient self care activities - Over the next 120 days, patient will: o Continue over the counter supplementation.   Constipation  . Pharmacist Clinical Goal(s) o Over the next 120 days, patient will work with PharmD and providers to improve bowel movements.  . Current regimen:  o No medications . Interventions: o Discussed fiber supplementation . Patient  self care activities o Patient will focus on water and fiber intake.   Medication management . Pharmacist Clinical Goal(s): o Over the next 120 days, patient will work with PharmD and providers to achieve optimal medication adherence . Current pharmacy: [facility] mail order . Interventions o Comprehensive medication review performed. o Continue current medication management strategy . Patient self care activities - Over the next 120 days, patient will: o Take medications as prescribed o Report any questions or concerns to PharmD and/or provider(s)  Initial goal documentation        Vanessa Price was given information about Chronic Care Management services today including:  1. CCM service includes personalized support from designated clinical staff supervised by her physician, including individualized plan of care and coordination with other care providers 2. 24/7 contact phone numbers for assistance for urgent and routine care needs. 3. Standard insurance, coinsurance, copays and deductibles apply for chronic care management only during months in which we provide at least 20 minutes of these services. Most insurances cover these services at 100%, however patients may be responsible for any copay, coinsurance and/or deductible if applicable. This service may help you avoid the need for more expensive face-to-face services. 4. Only one practitioner may furnish and bill the service in a calendar month. 5. The patient may stop CCM services at any time (effective at the end of the month) by phone call to the office staff.  Patient agreed to services and verbal consent obtained.   The patient verbalized understanding of instructions provided today and agreed to receive a mailed copy of patient instruction and/or educational materials. Telephone follow up appointment with pharmacy team member scheduled for: 02/10/2020  Anson Crofts, PharmD Clinical Pharmacist [facility]  [facility] [place] [phone]    Cholesterol Content in Foods Cholesterol is a waxy, fat-like substance that helps to carry fat in the blood. The body needs cholesterol in small amounts, but too much cholesterol can cause damage to the arteries and heart. Most people should eat less than 200 milligrams (mg) of cholesterol a day. Foods with cholesterol  Cholesterol is found in animal-based foods, such as meat, seafood, and dairy. Generally, low-fat dairy and lean meats have less cholesterol than full-fat dairy and fatty meats. The milligrams of cholesterol per serving (mg per serving) of common cholesterol-containing foods are listed below. Meat and other proteins  Egg -- one large whole egg has 186 mg.  Veal shank -- 4 oz has 141 mg.  Lean ground [place] (93% lean) -- 4 oz has 118 mg.  Fat-trimmed lamb loin -- 4 oz has 106 mg.  Lean ground beef (90% lean) -- 4 oz has 100 mg.  Lobster -- 3.5 oz has 90 mg.  Pork loin chops -- 4 oz has 86 mg.  Canned salmon -- 3.5 oz has 83 mg.  Fat-trimmed beef top loin -- 4 oz has 78 mg.  Frankfurter -- 1 frank (3.5 oz) has 77 mg.  Crab -- 3.5 oz has 71 mg.  Roasted chicken without skin, white meat -- 4 oz has 66 mg.  Light bologna -- 2 oz has 45 mg.  Deli-cut [place] --  2 oz has 31 mg.  Canned tuna -- 3.5 oz has 31 mg.  Berniece Salines -- 1 oz has 29 mg.  Oysters and mussels (raw) -- 3.5 oz has 25 mg.  Mackerel -- 1 oz has 22 mg.  Trout -- 1 oz has 20 mg.  Pork sausage -- 1 link (1 oz) has 17 mg.  Salmon -- 1 oz has 16 mg.  Tilapia -- 1 oz has 14 mg. Dairy  Soft-serve ice cream --  cup (4 oz) has 103 mg.  Whole-milk yogurt -- 1 cup (8 oz) has 29 mg.  Cheddar cheese -- 1 oz has 28 mg.  American cheese -- 1 oz has 28 mg.  Whole milk -- 1 cup (8 oz) has 23 mg.  2% milk -- 1 cup (8 oz) has 18 mg.  Cream cheese -- 1 tablespoon (Tbsp) has 15 mg.  Cottage cheese --  cup (4 oz) has 14 mg.  Low-fat (1%) milk -- 1 cup (8 oz) has  10 mg.  Sour cream -- 1 Tbsp has 8.5 mg.  Low-fat yogurt -- 1 cup (8 oz) has 8 mg.  Nonfat Greek yogurt -- 1 cup (8 oz) has 7 mg.  Half-and-half cream -- 1 Tbsp has 5 mg. Fats and oils  Cod liver oil -- 1 tablespoon (Tbsp) has 82 mg.  Butter -- 1 Tbsp has 15 mg.  Lard -- 1 Tbsp has 14 mg.  Bacon grease -- 1 Tbsp has 14 mg.  Mayonnaise -- 1 Tbsp has 5-10 mg.  Margarine -- 1 Tbsp has 3-10 mg. Exact amounts of cholesterol in these foods may vary depending on specific ingredients and brands. Foods without cholesterol Most plant-based foods do not have cholesterol unless you combine them with a food that has cholesterol. Foods without cholesterol include:  Grains and cereals.  Vegetables.  Fruits.  Vegetable oils, such as olive, canola, and sunflower oil.  Legumes, such as peas, beans, and lentils.  Nuts and seeds.  Egg whites. Summary  The body needs cholesterol in small amounts, but too much cholesterol can cause damage to the arteries and heart.  Most people should eat less than 200 milligrams (mg) of cholesterol a day. This information is not intended to replace advice given to you by your health care provider. Make sure you discuss any questions you have with your health care provider. Document Revised: 02/20/2017 Document Reviewed: 11/04/2016 Elsevier Patient Education  [employer].

## 2019-10-21 MED ORDER — REPATHA SURECLICK 140 MG/ML ~~LOC~~ SOAJ: 1.0000 | SUBCUTANEOUS | 11 refills | Status: DC

## 2019-10-21 NOTE — Telephone Encounter (Signed)
Spoke with patient and notified med was approved. Rx sent to [facility] per request. Explained med, possible side effects, administration. Sent link to [employer].

## 2019-10-21 NOTE — Addendum Note (Signed)
Addended by: Fidel Levy on: 10/21/2019 09:37 AM   Modules accepted: Orders

## 2019-11-03 DIAGNOSIS — Z1231 Encounter for screening mammogram for malignant neoplasm of breast: Secondary | ICD-10-CM | POA: Diagnosis not present

## 2019-11-03 LAB — HM MAMMOGRAPHY

## 2019-11-09 ENCOUNTER — Encounter: Payer: Self-pay | Admitting: Internal Medicine

## 2019-11-10 ENCOUNTER — Telehealth: Payer: Self-pay | Admitting: Internal Medicine

## 2019-11-10 NOTE — Telephone Encounter (Signed)
Faxed Repatha assistance application to [employer] @ [phone] 

## 2019-11-14 NOTE — Telephone Encounter (Signed)
Patient is NOT approved for Repatha assistance from [employer] as "your insurance plan indicated you have insurance coverage for the medication requested"  Patient notified via [facility]

## 2019-11-25 ENCOUNTER — Other Ambulatory Visit: Payer: Self-pay | Admitting: Internal Medicine

## 2019-11-25 DIAGNOSIS — I10 Essential (primary) hypertension: Secondary | ICD-10-CM

## 2019-12-15 ENCOUNTER — Other Ambulatory Visit: Payer: Self-pay | Admitting: Internal Medicine

## 2019-12-15 DIAGNOSIS — E781 Pure hyperglyceridemia: Secondary | ICD-10-CM

## 2019-12-15 DIAGNOSIS — E785 Hyperlipidemia, unspecified: Secondary | ICD-10-CM

## 2020-01-12 ENCOUNTER — Ambulatory Visit: Payer: Self-pay

## 2020-01-12 ENCOUNTER — Ambulatory Visit: Payer: Medicare Other | Admitting: Orthopedic Surgery

## 2020-01-12 ENCOUNTER — Encounter: Payer: Self-pay | Admitting: Orthopedic Surgery

## 2020-01-12 VITALS — Ht 64.0 in | Wt 200.0 lb

## 2020-01-12 DIAGNOSIS — M25562 Pain in left knee: Secondary | ICD-10-CM | POA: Diagnosis not present

## 2020-01-12 DIAGNOSIS — M549 Dorsalgia, unspecified: Secondary | ICD-10-CM | POA: Diagnosis not present

## 2020-01-12 MED ORDER — PREDNISONE 10 MG PO TABS: 20.0000 mg | ORAL_TABLET | Freq: Every day | ORAL | 0 refills | Status: DC

## 2020-01-12 NOTE — Progress Notes (Signed)
Office Visit Note   Patient: Vanessa Price           Date of Birth: 10-05-1955           MRN: [id number] Visit Date: 01/12/2020              Requested by: [name], [name], MD [place],  [place] [id number] PCP: [name], [name], MD  Chief Complaint  Patient presents with  . Left Hip - Pain  . Left Knee - Pain  . Left Foot - Pain      HPI: Patient is a 64 year old woman who presents stating that she is been having pain in the posterior aspect of the left knee complains of cramping pain that radiates up to her groin and that pain radiates from the back of the knee down to her heel.  She states she has had this for a while she has had to stop walking due to the progression of pain she complains of numbness and stiffness with prolonged sitting.  Assessment & Plan: Visit Diagnoses:  1. Acute pain of left knee   2. Radiating back pain     Plan: Patient symptoms seem to be most consistent with radicular symptoms.  We will start her on prednisone 20 mg with breakfast and wean off as her symptoms resolved.  Discussed that if she is still symptomatic at follow-up we may need to consider an MRI scan to further evaluate disc  foraminal stenosis pathology.  Follow-Up Instructions: Return in about 4 weeks (around 02/09/2020).   Ortho Exam  Patient is alert, oriented, no adenopathy, well-dressed, normal affect, normal respiratory effort. Examination patient has a normal gait there is no pain with range of motion of the left knee there is no crepitation there is no effusion.  Medial lateral joint lines are nontender to palpation collaterals cruciates are stable she has a negative straight leg raise no pain with range of motion of the hip or ankle.  No focal motor weakness in the left lower extremity.  She has a good dorsalis pedis pulse no indication of ischemic changes.  Imaging: XR Knee 1-2 Views Left  Result Date: 01/12/2020 2 view  radiographs of the left knee shows no bony abnormalities no cystic changes no joint space narrowing.  XR Lumbar Spine 2-3 Views  Result Date: 01/12/2020 2 view radiographs of the lumbar spine shows a degenerative scoliosis anterior osteophytic bone spurs joint space narrowing no compression fractures no spondylolisthesis there is calcification of the aorta.  Largest diameter of the aorta appears to be 2-1/2 cm.  No images are attached to the encounter.  Labs: Lab Results  Component Value Date   HGBA1C 6.2 (A) 06/23/2019   HGBA1C 6.2 (A) 02/10/2019   HGBA1C 6.3 10/26/2018   LABORGA KLEBSIELLA PNEUMONIAE 05/17/2015     Lab Results  Component Value Date   ALBUMIN 4.8 10/07/2019   ALBUMIN 4.7 08/05/2019   ALBUMIN 4.5 10/26/2018    No results found for: MG Lab Results  Component Value Date   VD25OH 37.47 02/10/2019   VD25OH 28.10 (L) 10/26/2018    No results found for: PREALBUMIN CBC EXTENDED Latest Ref Rng & Units 10/26/2018 10/13/2017 09/18/2016  WBC 4.0 - 10.5 K/uL 4.8 4.2 4.8  RBC 3.87 - 5.11 Mil/uL 4.90 5.07 5.00  HGB 12.0 - 15.0 g/dL 13.6 14.2 14.0  HCT 36 - 46 % 41.8 42.4 41.4  PLT 150 - 400 K/uL 230.0 224.0 221.0  NEUTROABS 1.4 -  7.7 K/uL 2.4 2.0 -  LYMPHSABS 0.7 - 4.0 K/uL 1.9 1.8 -     Body mass index is 34.33 kg/m.  Orders:  Orders Placed This Encounter  Procedures  . XR Knee 1-2 Views Left  . XR Lumbar Spine 2-3 Views   Meds ordered this encounter  Medications  . predniSONE (DELTASONE) 10 MG tablet    Sig: Take 2 tablets (20 mg total) by mouth daily with breakfast.    Dispense:  60 tablet    Refill:  0     Procedures: No procedures performed  Clinical Data: No additional findings.  ROS:  All other systems negative, except as noted in the HPI. Review of Systems  Objective: Vital Signs: Ht 5\' 4"  (1.626 m)   Wt 200 lb (90.7 kg)   BMI 34.33 kg/m   Specialty Comments:  No specialty comments available.  PMFS History: Patient Active  Problem List   Diagnosis Date Noted  . Vitamin D deficiency 10/26/2018  . Hx of adenomatous polyp of colon 03/11/2018  . Constipation 02/10/2018  . Impaired glucose tolerance 10/02/2015  . ANXIETY 06/04/2009  . MYOCARDIAL PERFUSION SCAN, WITH STRESS TEST, ABNORMAL 10/19/2008  . PREMATURE VENTRICULAR CONTRACTIONS 08/14/2008  . GERD 08/14/2008  . Goiter 01/04/2008  . Dyslipidemia 09/14/2006  . Essential hypertension 09/14/2006   Past Medical History:  Diagnosis Date  . ANXIETY 06/04/2009  . GERD 08/14/2008   occasional  . Hx of adenomatous polyp of colon 03/11/2018  . HYPERLIPIDEMIA 09/14/2006  . HYPERTENSION 09/14/2006  . Impaired glucose tolerance   . MYOCARDIAL PERFUSION SCAN, WITH STRESS TEST, ABNORMAL 10/19/2008  . PREMATURE VENTRICULAR CONTRACTIONS 08/14/2008    Family History  Problem Relation Age of Onset  . Arthritis Mother   . Diabetes Mother   . Heart disease Mother   . Mental illness Father   . Cancer Brother        Lung    Past Surgical History:  Procedure Laterality Date  . ABDOMINAL HYSTERECTOMY  1999   menorrhagia/partial  . CESAREAN SECTION     2 times   Social History   Occupational History  . Not on file  Tobacco Use  . Smoking status: Former Smoker    Quit date: 03/24/1993    Years since quitting: 26.8  . Smokeless tobacco: Never Used  Vaping Use  . Vaping Use: Never used  Substance and Sexual Activity  . Alcohol use: Yes    Alcohol/week: 1.0 standard drink    Types: 1 Standard drinks or equivalent per week    Comment: social  . Drug use: No  . Sexual activity: Not Currently    Birth control/protection: Surgical    Comment: HYst-1st intercourse 64 yo-More than 5 partners

## 2020-02-06 ENCOUNTER — Ambulatory Visit: Payer: Medicare Other | Admitting: Orthopedic Surgery

## 2020-02-09 ENCOUNTER — Ambulatory Visit: Payer: Medicare Other | Admitting: Orthopedic Surgery

## 2020-02-10 ENCOUNTER — Ambulatory Visit: Payer: Medicare Other | Admitting: Pharmacist

## 2020-02-10 DIAGNOSIS — E785 Hyperlipidemia, unspecified: Secondary | ICD-10-CM

## 2020-02-10 DIAGNOSIS — I10 Essential (primary) hypertension: Secondary | ICD-10-CM

## 2020-02-10 NOTE — Chronic Care Management (AMB) (Signed)
Chronic Care Management Pharmacy  Name: Vanessa Price  MRN: [id number] DOB: 08/01/1955  Initial Questions: 1. Have you seen any other providers since your last visit? Yes  2. Any changes in your medicines or health? No   Chief Complaint/ HPI  [employer],  64 y.o. , female presents for their Follow-Up CCM visit with the clinical pharmacist via telephone due to COVID-19 Pandemic.   PCP : Vanessa Price, Vanessa Halsted, MD  Their chronic conditions include: HTN, HLD, Impaired glucose intolerance, Constipation, Pain, vitamin D Deficiency  Office Visits: 08/05/2019- Vanessa Frohlich, MD- Patient presented for office visit for blood pressure follow up. Patient is well controlled. BP: 130/80. No changes to medications. Patient to obtain BMET, hepatic panel.   07/15/2019- Vanessa Frohlich, MD- Patient presented for video visit for constipation. Patient to continue fiber supplement and to drink at least 70 ounces of water per day.  Patient to also try Colace and Miralax.   06/23/2019- Vanessa Frohlich, MD- Patient presented for office visit for follow up on chronic medical conditions. BP: 140/90. Losartan 64m added to regimen. Patient to follow up in 6 to 8 weeks.   Consult Visit: 01/12/20 Vanessa Score MD (ortho): Patient presented with left knee pain. Prescribed prednisone and ordered x-rays of knee and spine.   10/12/19 KLyman Bishop MD (cardiology): Patient presented for HLD follow up. Bempedoic acid did not reduce LDL enough. Plan to start RGarza-Sal[place] Price  05/03/2019- Cardiology- KLyman Bishop MD- Patient presented for a telehealth visit for follow up on calcium Price. Coronary calcium: 0. Patient to obtain repeat lipid panel. If LDL is >190, PCSK9 inhibitor to be considered. If not, pursue bempedoic acid. Patient to obtain repeat liver enzymes and lipid profile after starting medication. Patient to return in 4 months.   10/29/2018- Gynecology- Vanessa Furlong MD- Patient presented for office visit for annual gynecologic exam. Patient to obtain DEXA. Patient had normal breast exam as well as PAP smear. Patient to follow up in 1 year.   Medications: Outpatient Encounter Medications as of 02/10/2020  Medication Sig  . Bempedoic Acid (NEXLETOL) 180 MG TABS Take 1 tablet by mouth daily.  . Cholecalciferol (VITAMIN D3) 125 MCG (5000 UT) CAPS Take 1 capsule by mouth daily.  . Evolocumab (REPATHA SURECLICK) 1599MG/ML SOAJ Inject 1 Dose into the skin every 14 (fourteen) days.  . fenofibrate (TRICOR) 145 MG tablet TAKE 1 TABLET BY MOUTH  DAILY  . hydrochlorothiazide (HYDRODIURIL) 25 MG tablet Take 1 tablet (25 mg total) by mouth daily.  .Marland Kitchenlosartan (COZAAR) 50 MG tablet TAKE 1 TABLET BY MOUTH  DAILY  . Multiple Vitamins-Minerals (ONE DAILY WOMENS 50 PLUS) TABS Take by mouth. Centrum Womens 50 plus MVI  . naproxen sodium (ALEVE) 220 MG tablet Take 220 mg by mouth as needed.   . predniSONE (DELTASONE) 10 MG tablet Take 2 tablets (20 mg total) by mouth daily with breakfast.   No facility-administered encounter medications on file as of 02/10/2020.     Current Diagnosis/Assessment:  Goals Addressed            This Visit's Progress   . Pharmacy Care Plan       CARE PLAN ENTRY (see longitudinal plan of care for additional care plan information)  Current Barriers:  . Chronic Disease Management support, education, and care coordination needs related to Hypertension, Hyperlipidemia, and Impaired glucose tolerance, constipation, vitamin D deficiency (resolved)    Hypertension BP Readings from Last 3 Encounters:  10/12/19 132/74  08/05/19  130/80  06/23/19 140/90   . Pharmacist Clinical Goal(s): o Over the next 120 days, patient will work with PharmD and providers to maintain BP goal <130/80 . Current regimen:   Hydrochlorothiazide 39m, 1 tablet once daily  Losartan 563m 1 tablet once daily . Interventions: . Discussed DASH eating  plan recommendations: . Emphasizes vegetables, fruits, and whole-grains . Includes fat-free or low-fat dairy products, fish, poultry, beans, nuts, and vegetable oils . Limits foods that are high in saturated fat. These foods include fatty meats, full-fat dairy products, and tropical oils such as coconut, palm kernel, and palm oils. . Limits sugar-sweetened beverages and sweets . Limiting sodium intake to < 1500 mg/day . Discussed the importance of checking your blood pressure at home and taking your medications as prescribed . Patient self care activities - Over the next 120 days, patient will: o Check BP as directed, document, and provide at future appointments o Ensure daily salt intake < 2300 mg/day  Hyperlipidemia Lab Results  Component Value Date/Time   LDLCALC 153 (H) 10/07/2019 11:13 AM   LDLDIRECT 177.0 06/23/2019 08:06 AM   . Pharmacist Clinical Goal(s): o Over the next 120 days, patient will work with PharmD and providers to achieve LDL goal < 100 . Current regimen:  . bempedoic acid (Nexletol) 18010m1 tablet once daily . Fenofibrate 145m5m tablet once daily . Repatha 140 mg inject every 14 days . Interventions: o We discussed how a diet high in plant sterols (fruits/vegetables/nuts/whole grains/legumes) may reduce your cholesterol. Encouraged increasing fiber to a daily intake of 10-25g/day.  . Patient self care activities - Over the next 120 days, patient will: o Continue current medications as directed and modifying diet.   Impaired glucose tolerance Lab Results  Component Value Date/Time   HGBA1C 6.2 (A) 06/23/2019 07:44 AM   HGBA1C 6.2 (A) 02/10/2019 07:09 AM   HGBA1C 6.3 10/26/2018 08:03 AM   HGBA1C 6.1 02/10/2018 07:43 AM   . Pharmacist Clinical Goal(s): o Over the next 120 days, patient will work with PharmD and providers to maintain A1c goal <6.5% . Current regimen:  o No medications  . Interventions: . Recommend: Keeping blood pressure and lipids under  goal. We discussed modifying lifestyle, including to participate in moderate physical activity (e.g., walking) at least 150 minutes per week.  . Discussed a Mediterranean eating plan with an emphasis on whole grains, legumes, nuts, fruits, and vegetables and minimal refined and processed foods. . Patient self care activities - Over the next 120 days, patient will: o Continue diet and exercise modifications.   Vitamin D deficiency (resolved)  VITD  Date Value Ref Range Status  02/10/2019 37.47 30.00 - 100.00 ng/mL Final .  Pharmacist Clinical Goal(s) o Over the next 120 days, patient will work with PharmD and providers to maintain vitamin D level: 30 to 100 ng/mL . Current regimen:  o Vitamin D3, 5000 units, 1 tablet once daily  . Interventions: o Recommend vitamin D 2000 units, once daily  . Patient self care activities - Over the next 120 days, patient will: o Continue over the counter supplementation.   Constipation  . Pharmacist Clinical Goal(s) o Over the next 120 days, patient will work with PharmD and providers to improve bowel movements.  . Current regimen:  o No medications . Interventions: o Discussed fiber supplementation . Patient self care activities o Patient will focus on water and fiber intake.   Medication management . Pharmacist Clinical Goal(s): o Over the next 120 days, patient  will work with PharmD and providers to achieve optimal medication adherence . Current pharmacy: [facility] mail order . Interventions o Comprehensive medication review performed. o Continue current medication management strategy . Patient self care activities - Over the next 120 days, patient will: o Take medications as prescribed o Report any questions or concerns to PharmD and/or provider(s)  Please see past updates related to this goal by clicking on the "Past Updates" button in the selected goal          Hypertension  BP goal < 140/90  Office blood pressures are  BP Readings  from Last 3 Encounters:  10/12/19 132/74  08/05/19 130/80  06/23/19 140/90   Kidney Function Lab Results  Component Value Date/Time   CREATININE 0.87 08/05/2019 07:12 AM   CREATININE 0.93 02/10/2019 07:38 AM   GFR 78.88 08/05/2019 07:12 AM   GFRNONAA 111.49 05/28/2009 10:29 AM   GFRAA 96 01/04/2008 12:00 AM   Vanessa 3.9 08/05/2019 07:12 AM   Vanessa 3.6 02/10/2019 07:38 AM   Patient has failed these meds in the past: olmesartan (cost)   Patient checks BP at home daily (in morning and after exercise)  Patient home BP readings are ranging: 153/93, 156/95, 124/81  Patient stopped taking BP medications and just restarted them in November.  Patient is controlled on:   Hydrochlorothiazide 30m, 1 tablet once daily  Losartan 53m 1 tablet once daily   We discussed diet and exercise extensively -DASH eating plan recommendations: . Emphasizes vegetables, fruits, and whole-grains . Includes fat-free or low-fat dairy products, fish, poultry, beans, nuts, and vegetable oils . Limits foods that are high in saturated fat. These foods include fatty meats, full-fat dairy products, and tropical oils such as coconut, palm kernel, and palm oils. . Limits sugar-sweetened beverages and sweets . Limiting sodium intake to < 1500 mg/day  Plan Continue current medications  Sent message to PCP about switching to combination losartan-HCTZ to cut down on pill burden and improve adherence.  Plan for BP assessment and adherence check in 1 month with CPA.   Hyperlipidemia   Denies ADRs. Bloating goes away in a day or 2.  LDL goal < 100  Lipid Panel     Component Value Date/Time   CHOL 237 (H) 10/07/2019 1113   TRIG 279 (H) 10/07/2019 1113   TRIG 142 03/03/2006 0906   HDL 32 (L) 10/07/2019 1113   LDLCALC 153 (H) 10/07/2019 1113   LDLDIRECT 177.0 06/23/2019 0806    Hepatic Function Latest Ref Rng & Units 10/07/2019 08/05/2019 10/26/2018  Total Protein 6.0 - 8.5 g/dL 7.4 7.0 6.8  Albumin 3.8 - 4.8 g/dL  4.8 4.7 4.5  AST 0 - 40 IU/L 55(H) 37 18  ALT 0 - 32 IU/L 60(H) 39(H) 22  Alk Phosphatase 48 - 121 IU/L 55 44 49  Total Bilirubin 0.0 - 1.2 mg/dL 0.3 0.3 0.2  Bilirubin, Direct 0.00 - 0.40 mg/dL 0.13 0.1 -    The 10-year ASCVD risk Price (GMikey BussingC Jr., et al., 2013) is: 13.1%   Values used to calculate the Price:     Age: [age]     Sex: Female     Is Non-Hispanic African American: Yes     Diabetic: No     Tobacco smoker: No     Systolic Blood Pressure: 13962mHg     Is BP treated: Yes     HDL Cholesterol: 32 mg/dL     Total Cholesterol: 237 mg/dL   Patient has failed  these meds in past: atorvastatin, pravastatin, ezetimibe  Patient is currently controlled on the following medications:  . bempedoic acid (Nexletol) 129m, 1 tablet once daily . Fenofibrate 1468m 1 tablet once daily . Repatha 140 mg inject every 14 days (bloating)  We discussed:  diet and exercise extensively  . We discussed how a diet high in plant sterols (fruits/vegetables/nuts/whole grains/legumes) may reduce your cholesterol. Encouraged increasing fiber to a daily intake of 10-25g/day.  . Suzie Portelaalls to remember to refill  Plan Continue current medications and control with diet and exercise.   Impaired glucose tolerance   Recent Relevant Labs: Lab Results  Component Value Date/Time   HGBA1C 6.2 (A) 06/23/2019 07:44 AM   HGBA1C 6.2 (A) 02/10/2019 07:09 AM   HGBA1C 6.3 10/26/2018 08:03 AM   HGBA1C 6.1 02/10/2018 07:43 AM    Patient has failed these meds in past: none  Patient is currently controlled on the following medications:   No medications  We discussed: diet and exercise extensively   Plan Continue current medications  Constipation   Patient is currently controlled on the following medications:  . Psyllium powder, 2 tablespoons daily   Plan Continue current medications   Pain    Patient is currently controlled on the following medications:  . Naproxen 22053m2 tablets as  needed . Prednisone 10 mg, 2 tablets daily  Plan Continue current medications  Vitamin D deficiency (resolved)    VITD  Date Value Ref Range Status  02/10/2019 37.47 30.00 - 100.00 ng/mL Final    Patient is currently controlled on the following medications:  . VMarland Kitchentamin D3, 5000 units, 1 tablet once daily   Plan Continue current medications Recommend repeat vitamin D.   OTC/ supplements   Patient is currently on the following medications:  . Centrum 50+ Women's, 1 tablet once daily   Plan Continue current medications    Vaccines   Reviewed and discussed patient's vaccination history.    Immunization History  Administered Date(s) Administered  . Influenza Split 12/24/2010, 12/17/2011  . Influenza,inj,Quad PF,6+ Mos 01/07/2013  . PFIZER SARS-COV-2 Vaccination 07/19/2019, 08/09/2019  . Tdap 08/17/2014  . Zoster Recombinat (Shingrix) 10/26/2018, 02/10/2019   Discussed recommendations of pneumonia vaccine.   Added second COVID vaccine to chart based [place] Recommended patient receive Prevnar and influenza vaccine in office/at pharmacy.  Medication Management   [facility] [address] [place]: [phone]: [phone] pill box? No - patient takes all medications in the morning Pt endorses 100% compliance  We discussed: Current pharmacy is preferred with insurance plan and patient is satisfied with pharmacy services  Plan  Continue current medication management strategy  Follow up: 4 month phone visit (not Wednesday)  MadJeni SallesharmD Clinical Pharmacist [facility]

## 2020-02-21 NOTE — Patient Instructions (Addendum)
Hi Mylissa,  It was lovely to get to meet you over the phone! Below is a summary of some of the topics we discussed. As a reminder, I have sent a message to Dr. Jerilee Hoh about combining your blood pressure medications into one tablet so please be on the lookout for that new prescription. Also, please continue to check your blood pressure consistently at home and continue to take your medications. I will have my assistant, Mimi, reach out to you in a month or two to check in on your blood pressure to make sure the medications are working properly.  Please give me a call if you have questions or need anything before our follow up in 4 months!  Best, Maddie  [name], PharmD [facility] Clinical Pharmacist [place] [facility] [place]   Visit Information  Goals Addressed            This Visit's Progress   . Pharmacy Care Plan       CARE PLAN ENTRY (see longitudinal plan of care for additional care plan information)  Current Barriers:  . Chronic Disease Management support, education, and care coordination needs related to Hypertension, Hyperlipidemia, and Impaired glucose tolerance, constipation, vitamin D deficiency (resolved)    Hypertension BP Readings from Last 3 Encounters:  10/12/19 132/74  08/05/19 130/80  06/23/19 140/90   . Pharmacist Clinical Goal(s): o Over the next 120 days, patient will work with PharmD and providers to maintain BP goal <130/80 . Current regimen:   Hydrochlorothiazide 25mg , 1 tablet once daily  Losartan 50mg , 1 tablet once daily . Interventions: . Discussed DASH eating plan recommendations: . Emphasizes vegetables, fruits, and whole-grains . Includes fat-free or low-fat dairy products, fish, poultry, beans, nuts, and vegetable oils . Limits foods that are high in saturated fat. These foods include fatty meats, full-fat dairy products, and tropical oils such as coconut, palm kernel, and palm oils. . Limits sugar-sweetened  beverages and sweets . Limiting sodium intake to < 1500 mg/day . Discussed the importance of checking your blood pressure at home and taking your medications as prescribed . Patient self care activities - Over the next 120 days, patient will: o Check BP as directed, document, and provide at future appointments o Ensure daily salt intake < 2300 mg/day  Hyperlipidemia Lab Results  Component Value Date/Time   LDLCALC 153 (H) 10/07/2019 11:13 AM   LDLDIRECT 177.0 06/23/2019 08:06 AM   . Pharmacist Clinical Goal(s): o Over the next 120 days, patient will work with PharmD and providers to achieve LDL goal < 100 . Current regimen:  . bempedoic acid (Nexletol) 180mg , 1 tablet once daily . Fenofibrate 145mg , 1 tablet once daily . Repatha 140 mg inject every 14 days . Interventions: o We discussed how a diet high in plant sterols (fruits/vegetables/nuts/whole grains/legumes) may reduce your cholesterol. Encouraged increasing fiber to a daily intake of 10-25g/day.  . Patient self care activities - Over the next 120 days, patient will: o Continue current medications as directed and modifying diet.   Impaired glucose tolerance Lab Results  Component Value Date/Time   HGBA1C 6.2 (A) 06/23/2019 07:44 AM   HGBA1C 6.2 (A) 02/10/2019 07:09 AM   HGBA1C 6.3 10/26/2018 08:03 AM   HGBA1C 6.1 02/10/2018 07:43 AM   . Pharmacist Clinical Goal(s): o Over the next 120 days, patient will work with PharmD and providers to maintain A1c goal <6.5% . Current regimen:  o No medications  . Interventions: . Recommend: Keeping blood pressure and lipids under  goal. We discussed modifying lifestyle, including to participate in moderate physical activity (e.g., walking) at least 150 minutes per week.  . Discussed a Mediterranean eating plan with an emphasis on whole grains, legumes, nuts, fruits, and vegetables and minimal refined and processed foods. . Patient self care activities - Over the next 120 days, patient  will: o Continue diet and exercise modifications.   Vitamin D deficiency (resolved)  VITD  Date Value Ref Range Status  02/10/2019 37.47 30.00 - 100.00 ng/mL Final .  Pharmacist Clinical Goal(s) o Over the next 120 days, patient will work with PharmD and providers to maintain vitamin D level: 30 to 100 ng/mL . Current regimen:  o Vitamin D3, 5000 units, 1 tablet once daily  . Interventions: o Recommend vitamin D 2000 units, once daily  . Patient self care activities - Over the next 120 days, patient will: o Continue over the counter supplementation.   Constipation  . Pharmacist Clinical Goal(s) o Over the next 120 days, patient will work with PharmD and providers to improve bowel movements.  . Current regimen:  o No medications . Interventions: o Discussed fiber supplementation . Patient self care activities o Patient will focus on water and fiber intake.   Medication management . Pharmacist Clinical Goal(s): o Over the next 120 days, patient will work with PharmD and providers to achieve optimal medication adherence . Current pharmacy: [facility] order . Interventions o Comprehensive medication review performed. o Continue current medication management strategy . Patient self care activities - Over the next 120 days, patient will: o Take medications as prescribed o Report any questions or concerns to PharmD and/or provider(s)  Please see past updates related to this goal by clicking on the "Past Updates" button in the selected goal         The patient verbalized understanding of instructions, educational materials, and care plan provided today and declined offer to receive copy of patient instructions, educational materials, and care plan.   Telephone follow up appointment with pharmacy team member scheduled for: 4 months

## 2020-02-24 IMAGING — CT CT HEART SCORING
2 series · 16 of 20 positions shown, 18 images · non-contrast
Comparison: None.
COMPARISON: None.

Addendum:
EXAM:
OVER-READ INTERPRETATION  CT CHEST

The following report is an over-read performed by radiologist Dr.
Olaobaju Lizzie [REDACTED] on 04/13/2019. This over-read
does not include interpretation of cardiac or coronary anatomy or
pathology. The calcium score interpretation by the cardiologist is
attached.
CLINICAL DATA: Risk stratification
Coronary Calcium Score
TECHNIQUE: The patient was scanned on a Siemens Force scanner. Axial
non-contrast 3 mm slices were carried out through the heart. The
data set was analyzed on a dedicated work station and scored using
the Agatson method.

[Series 3: casc 3.0 i36f 2 bestdiast 68 % · axial · 0.35mm/px · z∈[-204,-105]mm · 8 of 43 slices shown, 10 images]
[im 5/43  vessel]
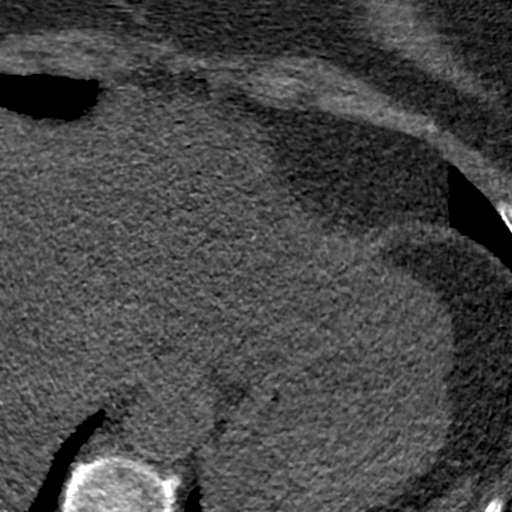
[im 5/43  lung]
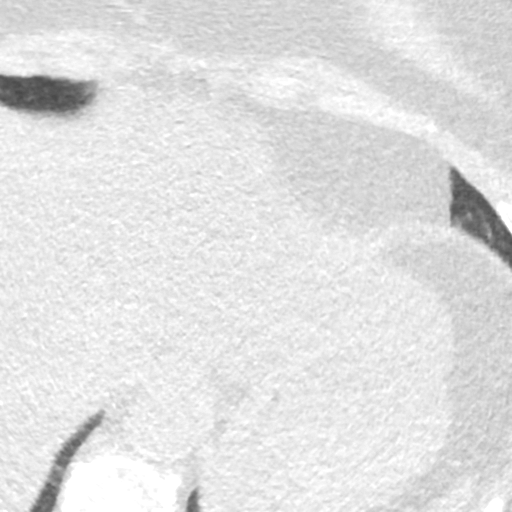
[im 10/43  vessel]
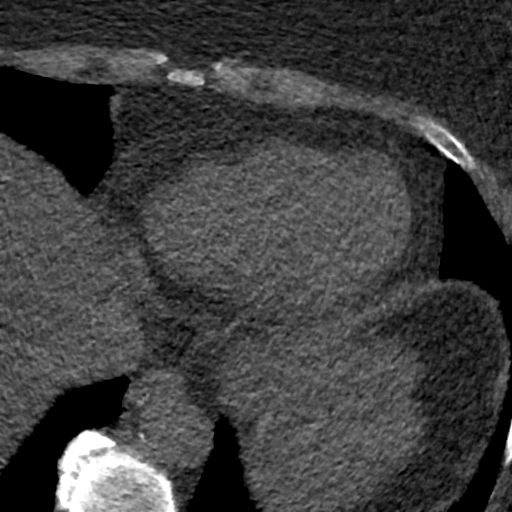
[im 15/43  vessel]
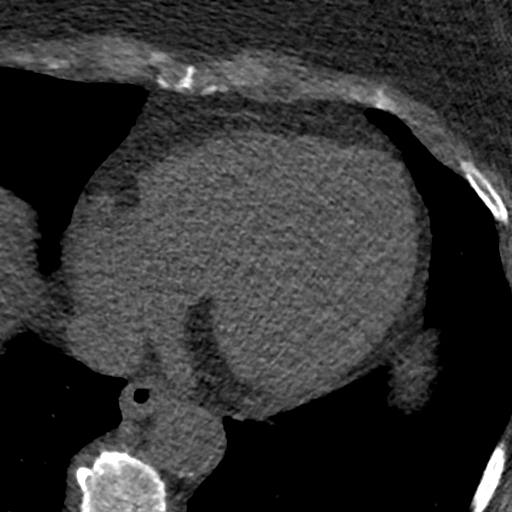
[im 19/43  vessel]
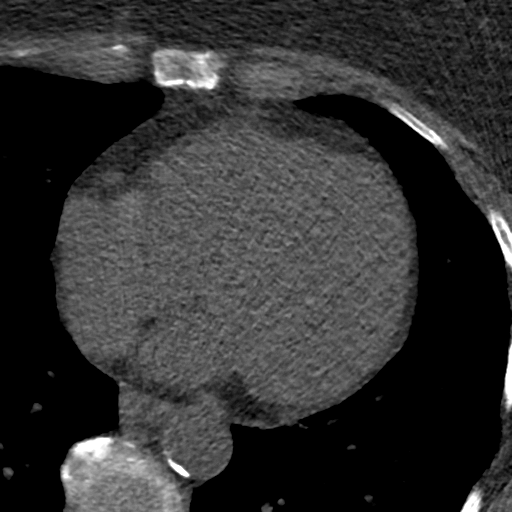
[im 24/43  vessel]
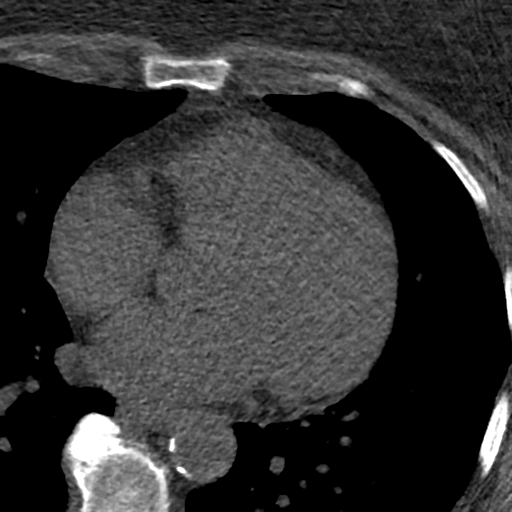
[im 24/43  lung]
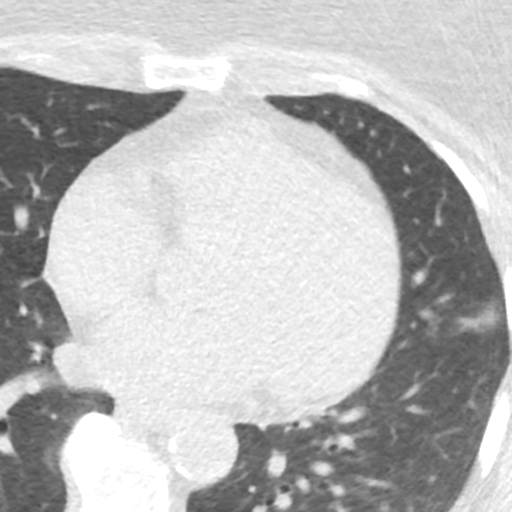
[im 29/43  vessel]
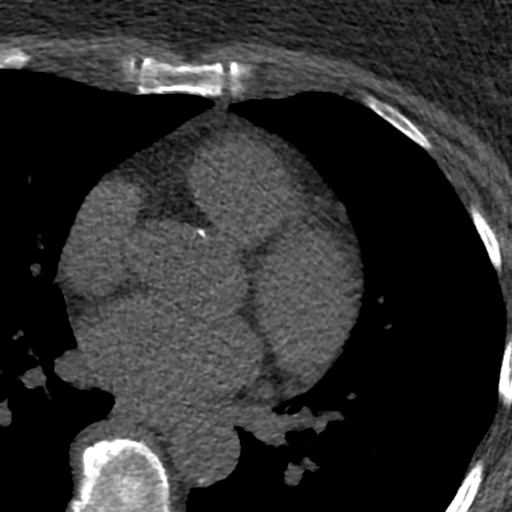
[im 33/43  vessel]
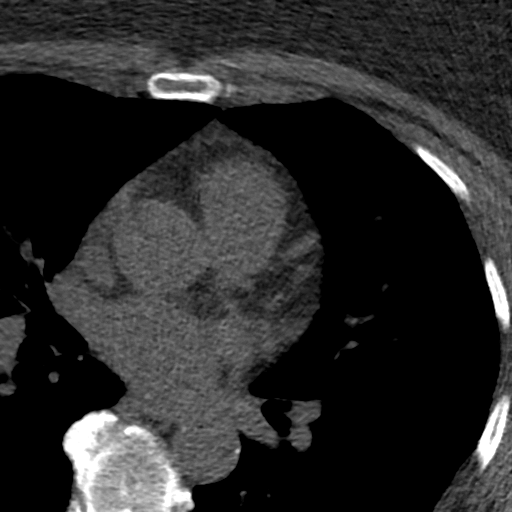
[im 38/43  vessel]
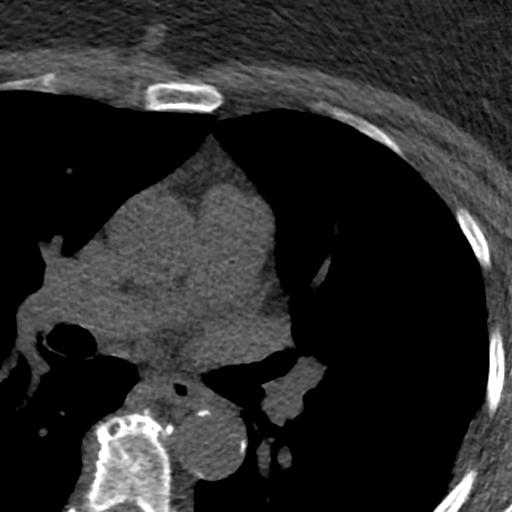

[Series 5: lung st 68 % · axial · 0.62mm/px · z∈[-204,-105]mm · 8 of 43 slices shown]
[im 5/43  lung]
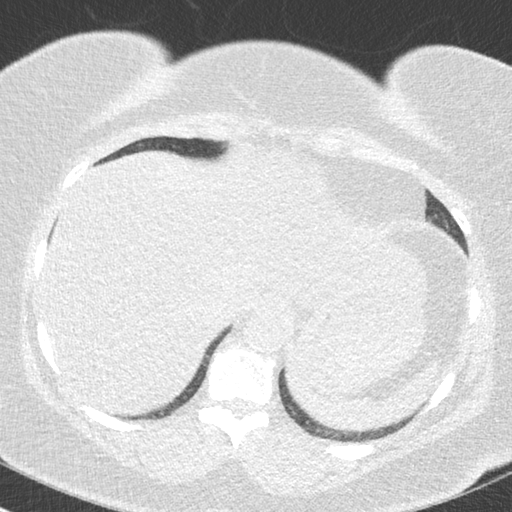
[im 10/43  lung]
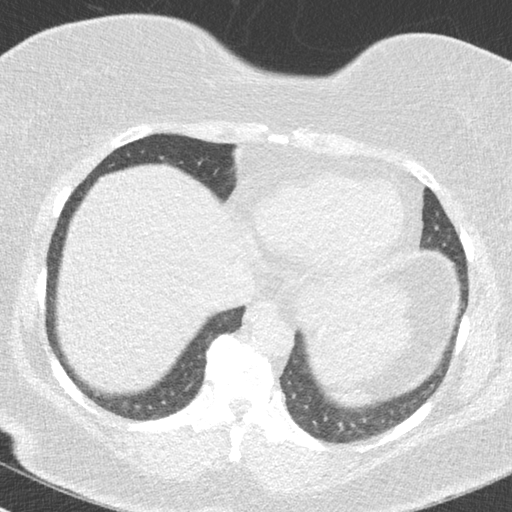
[im 15/43  lung]
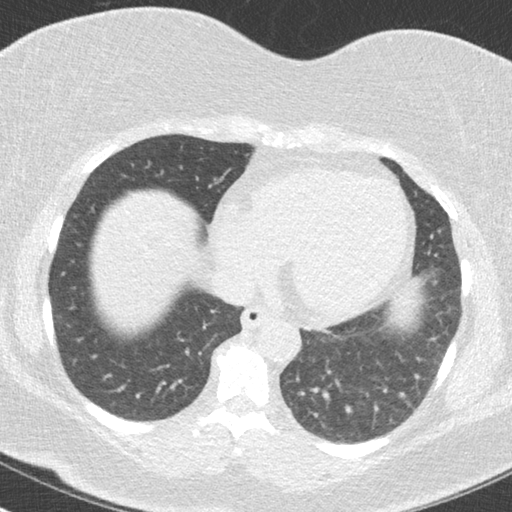
[im 19/43  lung]
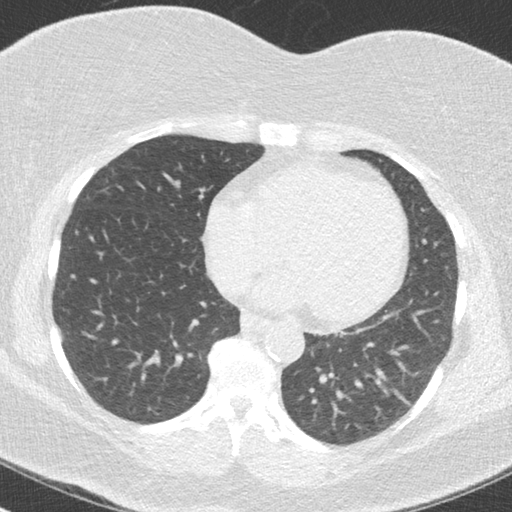
[im 24/43  lung]
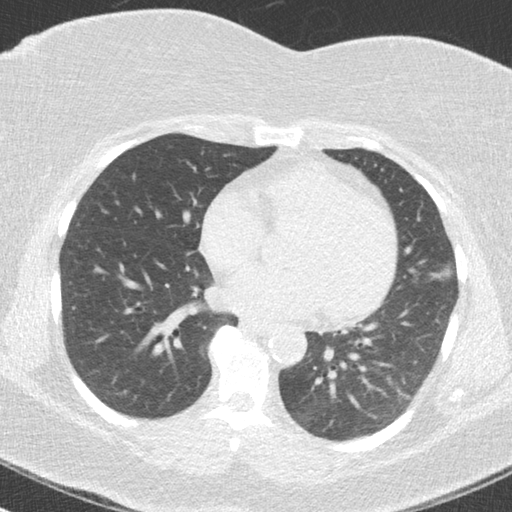
[im 29/43  lung]
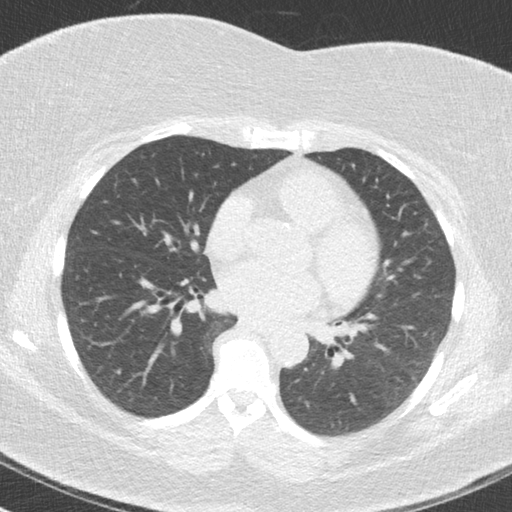
[im 33/43  lung]
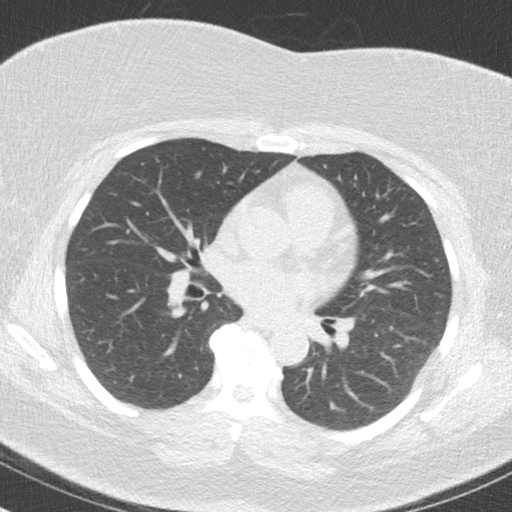
[im 38/43  lung]
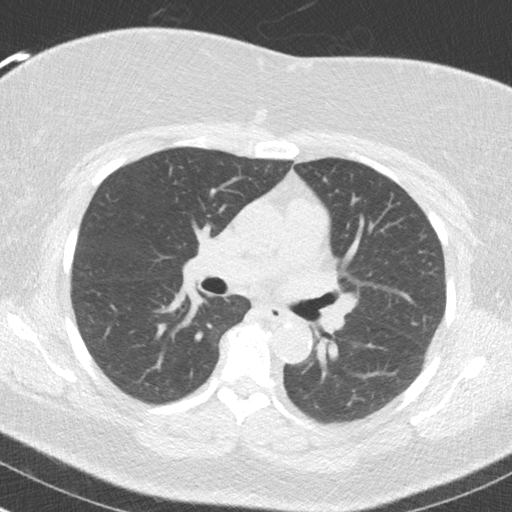

[16 of 20 positions shown; findings below may reference images not displayed]

FINDINGS: Vascular: Aortic atherosclerosis. Normal aortic caliber.

Mediastinum/Nodes: No imaged thoracic adenopathy.

Lungs/Pleura: No pleural fluid. Areas of scarring or subsegmental
atelectasis in the left lower lobe and lingula.

Upper Abdomen: Marked hepatic steatosis. Normal imaged portions of
the spleen, stomach.

Musculoskeletal: Moderate thoracic spondylosis.
IMPRESSION: 1. No acute extracardiac findings in the imaged chest.
2. Hepatic steatosis.
FINDINGS: Non-cardiac: See separate report from [REDACTED].

Ascending Aorta: Normal Caliber.  Calcifications present.

Pericardium: Normal

Coronary arteries: Normal coronary origins.
IMPRESSION: Coronary calcium score of 0. This was 0 percentile for age and sex
matched control.

Jae Loso

*** End of Addendum ***
EXAM:
OVER-READ INTERPRETATION  CT CHEST

The following report is an over-read performed by radiologist Dr.
Olaobaju Lizzie [REDACTED] on 04/13/2019. This over-read
does not include interpretation of cardiac or coronary anatomy or
pathology. The calcium score interpretation by the cardiologist is
attached.
FINDINGS: Vascular: Aortic atherosclerosis. Normal aortic caliber.

Mediastinum/Nodes: No imaged thoracic adenopathy.

Lungs/Pleura: No pleural fluid. Areas of scarring or subsegmental
atelectasis in the left lower lobe and lingula.

Upper Abdomen: Marked hepatic steatosis. Normal imaged portions of
the spleen, stomach.

Musculoskeletal: Moderate thoracic spondylosis.
IMPRESSION: 1. No acute extracardiac findings in the imaged chest.
2. Hepatic steatosis.

## 2020-03-13 ENCOUNTER — Telehealth: Payer: Self-pay | Admitting: Pharmacist

## 2020-03-13 ENCOUNTER — Telehealth: Payer: Self-pay | Admitting: Internal Medicine

## 2020-03-13 NOTE — Telephone Encounter (Signed)
Patient called needing an order for a PCR test for [facility].  She wants to have the PCR test done at [facility] and not at [facility] or [facility].  She is going on a trip January 3rd and has to have the results back within 72 hours of flying out.  Please advise

## 2020-03-13 NOTE — Chronic Care Management (AMB) (Signed)
Chronic Care Management Pharmacy Assistant   Name: Vanessa Price  MRN: [id number] DOB: 1955-11-13  Reason for Encounter: Disease State/ BP Adherence Call  PCP : Vanessa Price, Vanessa Halsted, MD  Allergies:   Allergies  Allergen Reactions  . Lisinopril     REACTION: cough  . Risperidone     REACTION: unsure of dose; pt describes reaction of bad cough    Medications: Outpatient Encounter Medications as of 03/13/2020  Medication Sig  . Bempedoic Acid (NEXLETOL) 180 MG TABS Take 1 tablet by mouth daily.  . Cholecalciferol (VITAMIN D3) 125 MCG (5000 UT) CAPS Take 1 capsule by mouth daily.  . Evolocumab (REPATHA SURECLICK) 702 MG/ML SOAJ Inject 1 Dose into the skin every 14 (fourteen) days.  . fenofibrate (TRICOR) 145 MG tablet TAKE 1 TABLET BY MOUTH  DAILY  . hydrochlorothiazide (HYDRODIURIL) 25 MG tablet Take 1 tablet (25 mg total) by mouth daily.  Marland Kitchen losartan (COZAAR) 50 MG tablet TAKE 1 TABLET BY MOUTH  DAILY  . Multiple Vitamins-Minerals (ONE DAILY WOMENS 50 PLUS) TABS Take by mouth. Centrum Womens 50 plus MVI  . naproxen sodium (ALEVE) 220 MG tablet Take 220 mg by mouth as needed.   . predniSONE (DELTASONE) 10 MG tablet Take 2 tablets (20 mg total) by mouth daily with breakfast.   No facility-administered encounter medications on file as of 03/13/2020.    Current Diagnosis: Patient Active Problem List   Diagnosis Date Noted  . Vitamin D deficiency 10/26/2018  . Hx of adenomatous polyp of colon 03/11/2018  . Constipation 02/10/2018  . Impaired glucose tolerance 10/02/2015  . ANXIETY 06/04/2009  . MYOCARDIAL PERFUSION SCAN, WITH STRESS TEST, ABNORMAL 10/19/2008  . PREMATURE VENTRICULAR CONTRACTIONS 08/14/2008  . GERD 08/14/2008  . Goiter 01/04/2008  . Dyslipidemia 09/14/2006  . Essential hypertension 09/14/2006    Goals Addressed   None    Reviewed chart prior to disease state call. Spoke with patient regarding BP  Recent Office Vitals: BP Readings  from Last 3 Encounters:  10/12/19 132/74  08/05/19 130/80  06/23/19 140/90   Pulse Readings from Last 3 Encounters:  10/12/19 82  08/05/19 79  06/23/19 78    Wt Readings from Last 3 Encounters:  01/12/20 200 lb (90.7 kg)  10/12/19 200 lb 12.8 oz (91.1 kg)  08/05/19 200 lb 12.8 oz (91.1 kg)     Kidney Function Lab Results  Component Value Date/Time   CREATININE 0.87 08/05/2019 07:12 AM   CREATININE 0.93 02/10/2019 07:38 AM   GFR 78.88 08/05/2019 07:12 AM   GFRNONAA 111.49 05/28/2009 10:29 AM   GFRAA 96 01/04/2008 12:00 AM    BMP Latest Ref Rng & Units 08/05/2019 02/10/2019 10/26/2018  Glucose 70 - 99 mg/dL 99 118(H) 111(H)  BUN 6 - 23 mg/dL 25(H) 20 15  Creatinine 0.40 - 1.20 mg/dL 0.87 0.93 0.77  Sodium 135 - 145 mEq/L 140 140 141  Potassium 3.5 - 5.1 mEq/L 3.9 3.6 4.0  Chloride 96 - 112 mEq/L 103 103 107  CO2 19 - 32 mEq/L 28 30 27   Calcium 8.4 - 10.5 mg/dL 9.7 9.9 9.7    . Current antihypertensive regimen:  . Hydrochlorothiazide (HYDRODIURIL) 25 mg one tablet daily . Losartan (COZAAR) 50 mg one tablet daily . How often are you checking your Blood Pressure? infrequently . Current home BP readings: She does not have any current readings. . What recent interventions/DTPs have been made by any provider to improve Blood Pressure control since last CPP Visit: None .  Any recent hospitalizations or ED visits since last visit with CPP? No . What diet changes have been made to improve Blood Pressure Control?  o None . What exercise is being done to improve your Blood Pressure Control? o She continues to do her stretches daily  Adherence Review: Is the patient currently on ACE/ARB medication? Yes Does the patient have >5 day gap between last estimated fill dates? No   Follow-Up:  Pharmacist Review    The patient denies any side effects with his medication. Also, denies any problems with his current pharmacy.  Vanessa Price, Vanessa Price  Assistant [phone]

## 2020-03-20 NOTE — Telephone Encounter (Signed)
Ok to place order 

## 2020-03-21 ENCOUNTER — Other Ambulatory Visit: Payer: Self-pay | Admitting: *Deleted

## 2020-03-21 MED ORDER — LOSARTAN POTASSIUM-HCTZ 50-12.5 MG PO TABS: 1.0000 | ORAL_TABLET | Freq: Every day | ORAL | 1 refills | Status: DC

## 2020-03-21 NOTE — Telephone Encounter (Signed)
Patient is aware.  Patient would like the test done 05/07/20.  Patient will get the fax number and call back.

## 2020-03-22 ENCOUNTER — Telehealth: Payer: Self-pay | Admitting: Internal Medicine

## 2020-03-22 NOTE — Telephone Encounter (Signed)
Spoke with patient and she is asymptomatic at this time.  I advised her to quarantine and to let us know if anything further is needed.  FYI

## 2020-03-22 NOTE — Telephone Encounter (Signed)
Pt wants to speak with Fleet Contras or Dr; she tested positive for covid and wants to know what to do next.

## 2020-03-28 DIAGNOSIS — H2513 Age-related nuclear cataract, bilateral: Secondary | ICD-10-CM | POA: Diagnosis not present

## 2020-03-28 DIAGNOSIS — H524 Presbyopia: Secondary | ICD-10-CM | POA: Diagnosis not present

## 2020-03-28 DIAGNOSIS — H5213 Myopia, bilateral: Secondary | ICD-10-CM | POA: Diagnosis not present

## 2020-03-28 DIAGNOSIS — H25013 Cortical age-related cataract, bilateral: Secondary | ICD-10-CM | POA: Diagnosis not present

## 2020-04-03 ENCOUNTER — Telehealth: Payer: Self-pay | Admitting: Internal Medicine

## 2020-04-03 NOTE — Telephone Encounter (Signed)
PA for repatha sureclick submitted via CMM (Key: [id number])

## 2020-04-10 ENCOUNTER — Other Ambulatory Visit: Payer: Self-pay | Admitting: Internal Medicine

## 2020-04-10 DIAGNOSIS — I10 Essential (primary) hypertension: Secondary | ICD-10-CM

## 2020-04-19 DIAGNOSIS — L650 Telogen effluvium: Secondary | ICD-10-CM | POA: Diagnosis not present

## 2020-04-19 DIAGNOSIS — L298 Other pruritus: Secondary | ICD-10-CM | POA: Diagnosis not present

## 2020-04-24 ENCOUNTER — Telehealth: Payer: Self-pay | Admitting: Internal Medicine

## 2020-04-24 NOTE — Telephone Encounter (Signed)
The patient is needing a letter stating that she is clear to travel.

## 2020-04-25 NOTE — Telephone Encounter (Signed)
Patient was positive covid 03/22/20.  Patient is travelling out of the country.  Covid entry requirements for patient to leave the country states that if she was positive 90 days before departure, she will need a clearance letter to travel so she will not need to be tested again before leaving her destination.  Patient will be leaving 05/15/20.  Okay to write letter?

## 2020-04-26 ENCOUNTER — Encounter: Payer: Self-pay | Admitting: Internal Medicine

## 2020-04-26 NOTE — Telephone Encounter (Signed)
Patient is aware 

## 2020-04-26 NOTE — Telephone Encounter (Signed)
Unfortunately, to be conscientious, she will need to retest before entering the [place] to make sure she has not contracted COVID while out of the country.

## 2020-05-23 ENCOUNTER — Telehealth: Payer: Self-pay | Admitting: *Deleted

## 2020-05-23 DIAGNOSIS — L650 Telogen effluvium: Secondary | ICD-10-CM | POA: Diagnosis not present

## 2020-05-23 MED ORDER — LOSARTAN POTASSIUM-HCTZ 50-12.5 MG PO TABS: 1.0000 | ORAL_TABLET | Freq: Every day | ORAL | 1 refills | Status: DC

## 2020-05-23 NOTE — Telephone Encounter (Signed)
-----   Message from Viona Gilmore, [facility] sent at 05/21/2020  1:03 PM EST ----- Regarding: losartan-HCTZ Hi,  Can you please resend Ms. Kathrene Bongo losartan-HCTZ to [place] on [address] for a 90 days supply? [facility] cannot fill it and they will not transfer it to [facility] either for some reason.  Thank you, Maddie

## 2020-05-30 ENCOUNTER — Telehealth: Payer: Self-pay | Admitting: Internal Medicine

## 2020-05-30 ENCOUNTER — Other Ambulatory Visit: Payer: Self-pay

## 2020-05-30 NOTE — Telephone Encounter (Signed)
Called patient back, advised of message from PharmD.  Patient verbalized understanding.

## 2020-05-30 NOTE — Telephone Encounter (Signed)
Patient has been on Nexletol for almost a year, and Repatha for about 8 months.  She would not be developing upset stomach after this amount of time.  Would recommend she see PCP for evaluation.

## 2020-05-30 NOTE — Telephone Encounter (Signed)
    Pt c/o medication issue:  1. Name of Medication:   Bempedoic Acid (NEXLETOL) 180 MG TABS    Evolocumab (REPATHA SURECLICK) 377 MG/ML SOAJ    2. How are you currently taking this medication (dosage and times per day)?   3. Are you having a reaction (difficulty breathing--STAT)?   4. What is your medication issue? Pt said she is having reactions with these 2 medication. She said it makes her stomach feel uncomfortable

## 2020-05-30 NOTE — Telephone Encounter (Signed)
Called patient, she states that for the last 4 weeks she has had stomach issues- I asked her to explain further but all I got was that she states it was an 'icky feeling' she states she just does not feel like normal. She was started on these medications back in the summer but I did advise she had been on these for a while, and would get recommendations from Pharmacy and call her back. She did say she had not started any other new medications for it to be anything else. And no other changes other than her stomach concerns.   Patient verbalized understanding, thankful for call back.

## 2020-05-31 ENCOUNTER — Ambulatory Visit (INDEPENDENT_AMBULATORY_CARE_PROVIDER_SITE_OTHER): Payer: Medicare Other | Admitting: Internal Medicine

## 2020-05-31 ENCOUNTER — Encounter: Payer: Self-pay | Admitting: Internal Medicine

## 2020-05-31 VITALS — BP 132/70 | HR 82 | Temp 98.1°F | Ht 64.0 in | Wt 199.1 lb

## 2020-05-31 DIAGNOSIS — I10 Essential (primary) hypertension: Secondary | ICD-10-CM

## 2020-05-31 DIAGNOSIS — K5900 Constipation, unspecified: Secondary | ICD-10-CM

## 2020-05-31 DIAGNOSIS — R7302 Impaired glucose tolerance (oral): Secondary | ICD-10-CM | POA: Diagnosis not present

## 2020-05-31 DIAGNOSIS — E785 Hyperlipidemia, unspecified: Secondary | ICD-10-CM | POA: Diagnosis not present

## 2020-05-31 LAB — COMPREHENSIVE METABOLIC PANEL
ALT: 36 U/L — ABNORMAL HIGH (ref 0–35)
AST: 30 U/L (ref 0–37)
Albumin: 4.5 g/dL (ref 3.5–5.2)
Alkaline Phosphatase: 44 U/L (ref 39–117)
BUN: 18 mg/dL (ref 6–23)
CO2: 27 mEq/L (ref 19–32)
Calcium: 9.8 mg/dL (ref 8.4–10.5)
Chloride: 106 mEq/L (ref 96–112)
Creatinine, Ser: 0.92 mg/dL (ref 0.40–1.20)
GFR: 64.85 mL/min (ref >60.00)
Glucose, Bld: 99 mg/dL (ref 70–99)
Potassium: 3.9 mEq/L (ref 3.5–5.1)
Sodium: 143 mEq/L (ref 135–145)
Total Bilirubin: 0.4 mg/dL (ref 0.2–1.2)
Total Protein: 7 g/dL (ref 6.0–8.3)

## 2020-05-31 LAB — LIPID PANEL
Cholesterol: 111 mg/dL (ref 0–200)
HDL: 38.3 mg/dL — ABNORMAL LOW (ref >39.00)
NonHDL: 73.04
Total CHOL/HDL Ratio: 3
Triglycerides: 320 mg/dL — ABNORMAL HIGH (ref 0.0–149.0)
VLDL: 64 mg/dL — ABNORMAL HIGH (ref 0.0–40.0)

## 2020-05-31 LAB — POCT GLYCOSYLATED HEMOGLOBIN (HGB A1C): Hemoglobin A1C: 5.9 % — AB (ref 4.0–5.6)

## 2020-05-31 LAB — LDL CHOLESTEROL, DIRECT: Direct LDL: 41 mg/dL

## 2020-05-31 NOTE — Patient Instructions (Signed)
-  Nice seeing you today!!  -Lab work today; will notify you once results are available.  -Start miralax daily and colace twice daily. Aim for a soft bowel movement at least every other day.  -Schedule follow up in 3 months for your physical. Please come in fasting that day.

## 2020-05-31 NOTE — Progress Notes (Signed)
Established Patient Office Visit     This visit occurred during the SARS-CoV-2 public health emergency.  Safety protocols were in place, including screening questions prior to the visit, additional usage of staff PPE, and extensive cleaning of exam room while observing appropriate contact time as indicated for disinfecting solutions.    CC/Reason for Visit: Follow-up chronic medical conditions  HPI: Vanessa Price is a 65 y.o. female who is coming in today for the above mentioned reasons. Past Medical History is significant for: Hypertension, hyperlipidemia intolerant to statins and ezetimibe currently being followed [facility] the [facility], impaired glucose tolerance.  She has been complaining of bilateral lower quadrant abdominal pain now for some time.  She has noticed increase gassiness.  She only has a bowel movement about once a week.  Last bowel movement was yesterday.  She feels like these issues started soon after commencing Repatha.  She has not been taking the bowel regimen that we had recommended previously.  She is overdue for annual physical.  She would like her lipids to be checked today.   Past Medical/Surgical History: Past Medical History:  Diagnosis Date  . ANXIETY 06/04/2009  . GERD 08/14/2008   occasional  . Hx of adenomatous polyp of colon 03/11/2018  . HYPERLIPIDEMIA 09/14/2006  . HYPERTENSION 09/14/2006  . Impaired glucose tolerance   . MYOCARDIAL PERFUSION SCAN, WITH STRESS TEST, ABNORMAL 10/19/2008  . PREMATURE VENTRICULAR CONTRACTIONS 08/14/2008    Past Surgical History:  Procedure Laterality Date  . ABDOMINAL HYSTERECTOMY  1999   menorrhagia/partial  . CESAREAN SECTION     2 times    Social History:  reports that she quit smoking about 27 years ago. She has never used smokeless tobacco. She reports current alcohol use of about 1.0 standard drink of alcohol per week. She reports that she does not use drugs.  Allergies: Allergies   Allergen Reactions  . Lisinopril     REACTION: cough  . Risperidone     REACTION: unsure of dose; pt describes reaction of bad cough    Family History:  Family History  Problem Relation Age of Onset  . Arthritis Mother   . Diabetes Mother   . Heart disease Mother   . Mental illness Father   . Cancer Brother        Lung     Current Outpatient Medications:  .  Bempedoic Acid (NEXLETOL) 180 MG TABS, Take 1 tablet by mouth daily., Disp: 90 tablet, Rfl: 3 .  Cholecalciferol (VITAMIN D3) 125 MCG (5000 UT) CAPS, Take 1 capsule by mouth daily., Disp: , Rfl:  .  Evolocumab (REPATHA SURECLICK) 161 MG/ML SOAJ, Inject 1 Dose into the skin every 14 (fourteen) days., Disp: 2 pen, Rfl: 11 .  fenofibrate (TRICOR) 145 MG tablet, TAKE 1 TABLET BY MOUTH  DAILY, Disp: 90 tablet, Rfl: 1 .  hydrochlorothiazide (HYDRODIURIL) 25 MG tablet, TAKE 1 TABLET BY MOUTH  DAILY, Disp: 90 tablet, Rfl: 3 .  losartan-hydrochlorothiazide (HYZAAR) 50-12.5 MG tablet, Take 1 tablet by mouth daily., Disp: 90 tablet, Rfl: 1 .  Multiple Vitamins-Minerals (ONE DAILY WOMENS 50 PLUS) TABS, Take by mouth. Centrum Womens 50 plus MVI, Disp: , Rfl:  .  naproxen sodium (ALEVE) 220 MG tablet, Take 220 mg by mouth as needed. , Disp: , Rfl:   Review of Systems:  Constitutional: Denies fever, chills, diaphoresis, appetite change and fatigue.  HEENT: Denies photophobia, eye pain, redness, hearing loss, ear pain, congestion, sore throat, rhinorrhea, sneezing, mouth  sores, trouble swallowing, neck pain, neck stiffness and tinnitus.   Respiratory: Denies SOB, DOE, cough, chest tightness,  and wheezing.   Cardiovascular: Denies chest pain, palpitations and leg swelling.  Gastrointestinal: Denies nausea, vomiting, diarrhea,  blood in stool.  Genitourinary: Denies dysuria, urgency, frequency, hematuria, flank pain and difficulty urinating.  Endocrine: Denies: hot or cold intolerance, sweats, changes in hair or nails, polyuria,  polydipsia. Musculoskeletal: Denies myalgias, back pain, joint swelling, arthralgias and gait problem.  Skin: Denies pallor, rash and wound.  Neurological: Denies dizziness, seizures, syncope, weakness, light-headedness, numbness and headaches.  Hematological: Denies adenopathy. Easy bruising, personal or family bleeding history  Psychiatric/Behavioral: Denies suicidal ideation, mood changes, confusion, nervousness, sleep disturbance and agitation    Physical Exam: Vitals:   05/31/20 0828  BP: 132/70  Pulse: 82  Temp: 98.1 F (36.7 C)  TempSrc: Oral  SpO2: 96%  Weight: 199 lb 1.6 oz (90.3 kg)  Height: 5\' 4"  (1.626 m)    Body mass index is 34.18 kg/m.   Constitutional: NAD, calm, comfortable, obese Eyes: PERRL, lids and conjunctivae normal, wears corrective lenses ENMT: Mucous membranes are moist.  Respiratory: clear to auscultation bilaterally, no wheezing, no crackles. Normal respiratory effort. No accessory muscle use.  Cardiovascular: Regular rate and rhythm, no murmurs / rubs / gallops. No extremity edema.  Abdomen: no tenderness to palpation, no masses palpated. No hepatosplenomegaly. Bowel sounds positive.  Neurologic: Grossly intact and nonfocal. Psychiatric: Normal judgment and insight. Alert and oriented x 3. Normal mood.    Impression and Plan:  Impaired glucose tolerance  -A1c is 5.9 today, continue healthy lifestyle changes.  Constipation, unspecified constipation type -Have advised increase fluid and fiber intake, have advised MiraLAX daily and Colace twice daily.  Goal is a soft bowel movement at least every other day. -Not clear if constipation is related to Repatha, but have advised her to discuss with Dr. Debara Pickett at next visit.  Essential hypertension -Well-controlled on losartan 50 mg and hydrochlorothiazide 12.5 mg.  Dyslipidemia  - Plan: Comprehensive metabolic panel, Lipid panel -Results will be forwarded to Dr. Debara Pickett for evaluation.    Patient  Instructions  -Nice seeing you today!!  -Lab work today; will notify you once results are available.  -Start miralax daily and colace twice daily. Aim for a soft bowel movement at least every other day.  -Schedule follow up in 3 months for your physical. Please come in fasting that day.     [name], MD [facility]

## 2020-06-13 ENCOUNTER — Telehealth: Payer: Self-pay | Admitting: Pharmacist

## 2020-06-13 ENCOUNTER — Encounter: Payer: Self-pay | Admitting: Internal Medicine

## 2020-06-13 NOTE — Chronic Care Management (AMB) (Signed)
I left the patient a message about herupcoming appointment on 06/14/2020 @ 1:00 pm  with the clinical pharmacist. She was asked to please have all medication on hand to review with the pharmacist.   Vanessa Price) Mare Ferrari, [place] Assistant [phone]

## 2020-06-14 ENCOUNTER — Ambulatory Visit (INDEPENDENT_AMBULATORY_CARE_PROVIDER_SITE_OTHER): Payer: Medicare Other | Admitting: Pharmacist

## 2020-06-14 DIAGNOSIS — E785 Hyperlipidemia, unspecified: Secondary | ICD-10-CM | POA: Diagnosis not present

## 2020-06-14 DIAGNOSIS — I10 Essential (primary) hypertension: Secondary | ICD-10-CM | POA: Diagnosis not present

## 2020-06-14 NOTE — Progress Notes (Signed)
Chronic Care Management Pharmacy Note  06/21/2020 Name:  Vanessa Price MRN:  [id number] DOB:  1955-09-12  Subjective: Vanessa Price is an 65 y.o. year old female who is a primary patient of [name], [name], MD.  The CCM team was consulted for assistance with disease management and care coordination needs.    Engaged with patient by telephone for follow up visit in response to provider referral for pharmacy case management and/or care coordination services.   Consent to Services:  The patient was given information about Chronic Care Management services, agreed to services, and gave verbal consent prior to initiation of services.  Please see initial visit note for detailed documentation.   Patient Care Team: [name], [name], MD as PCP - General (Internal Medicine) Viona Gilmore, [facility] as Pharmacist (Pharmacist)  Recent office visits: 05/31/20 Domingo Mend, MD: Patient presented for constipation follow up. Recommended miralax daily and colace twice daily. Patient believes it is from [place].   Recent consult visits: 01/12/20 [name], MD (ortho): Patient presented with left knee pain. Prescribed prednisone and ordered x-rays of knee and spine.   10/12/19 [name], MD (cardiology): Patient presented for HLD follow up. Bempedoic acid did not reduce LDL enough. Plan to start [place].  Hospital visits: None in previous 6 months  Objective:  Lab Results  Component Value Date   CREATININE 0.92 05/31/2020   BUN 18 05/31/2020   GFR 64.85 05/31/2020   GFRNONAA 111.49 05/28/2009   GFRAA 96 01/04/2008   NA 143 05/31/2020   K 3.9 05/31/2020   CALCIUM 9.8 05/31/2020   CO2 27 05/31/2020   GLUCOSE 99 05/31/2020    Lab Results  Component Value Date/Time   HGBA1C 5.9 (A) 05/31/2020 08:36 AM   HGBA1C 6.2 (A) 06/23/2019 07:44 AM   HGBA1C 6.3 10/26/2018 08:03 AM   HGBA1C 6.1 02/10/2018 07:43 AM   GFR 64.85 05/31/2020 08:54 AM    GFR 78.88 08/05/2019 07:12 AM    Last diabetic Eye exam: No results found for: HMDIABEYEEXA  Last diabetic Foot exam: No results found for: HMDIABFOOTEX   Lab Results  Component Value Date   CHOL 111 05/31/2020   HDL 38.30 (L) 05/31/2020   LDLCALC 153 (H) 10/07/2019   LDLDIRECT 41.0 05/31/2020   TRIG 320.0 (H) 05/31/2020   CHOLHDL 3 05/31/2020    Hepatic Function Latest Ref Rng & Units 05/31/2020 10/07/2019 08/05/2019  Total Protein 6.0 - 8.3 g/dL 7.0 7.4 7.0  Albumin 3.5 - 5.2 g/dL 4.5 4.8 4.7  AST 0 - 37 U/L 30 55(H) 37  ALT 0 - 35 U/L 36(H) 60(H) 39(H)  Alk Phosphatase 39 - 117 U/L 44 55 44  Total Bilirubin 0.2 - 1.2 mg/dL 0.4 0.3 0.3  Bilirubin, Direct 0.00 - 0.40 mg/dL - 0.13 0.1    Lab Results  Component Value Date/Time   TSH 1.65 10/26/2018 08:03 AM   TSH 1.87 10/13/2017 08:46 AM    CBC Latest Ref Rng & Units 10/26/2018 10/13/2017 09/18/2016  WBC 4.0 - 10.5 K/uL 4.8 4.2 4.8  Hemoglobin 12.0 - 15.0 g/dL 13.6 14.2 14.0  Hematocrit 36.0 - 46.0 % 41.8 42.4 41.4  Platelets 150.0 - 400.0 K/uL 230.0 224.0 221.0    Lab Results  Component Value Date/Time   VD25OH 37.47 02/10/2019 07:38 AM   VD25OH 28.10 (L) 10/26/2018 08:03 AM    Clinical ASCVD: No  The ASCVD Risk score Mikey Bussing DC Jr., et al., 2013) failed to calculate for the following reasons:  The valid total cholesterol range is 130 to 320 mg/dL    Depression screen [facility] 2/9 10/26/2018 02/10/2018 10/02/2015  Decreased Interest 0 0 0  Down, Depressed, Hopeless 0 1 0  PHQ - 2 Score 0 1 0  Altered sleeping 1 - -  Tired, decreased energy 0 - -  Change in appetite 0 - -  Feeling bad or failure about yourself  0 - -  Trouble concentrating 0 - -  Moving slowly or fidgety/restless 0 - -  Suicidal thoughts 0 - -  PHQ-9 Score 1 - -  Difficult doing work/chores Not difficult at all - -      Social History   Tobacco Use  Smoking Status Former Smoker  . Quit date: 03/24/1993  . Years since quitting: 27.2  Smokeless  Tobacco Never Used   BP Readings from Last 3 Encounters:  05/31/20 132/70  10/12/19 132/74  08/05/19 130/80   Pulse Readings from Last 3 Encounters:  05/31/20 82  10/12/19 82  08/05/19 79   Wt Readings from Last 3 Encounters:  05/31/20 199 lb 1.6 oz (90.3 kg)  01/12/20 200 lb (90.7 kg)  10/12/19 200 lb 12.8 oz (91.1 kg)   BMI Readings from Last 3 Encounters:  05/31/20 34.18 kg/m  01/12/20 34.33 kg/m  10/12/19 34.47 kg/m    Assessment/Interventions: Review of patient past medical history, allergies, medications, health status, including review of consultants reports, laboratory and other test data, was performed as part of comprehensive evaluation and provision of chronic care management services.   SDOH:  (Social Determinants of Health) assessments and interventions performed: No  SDOH Screenings   Alcohol Screen: Not on file  Depression (OEV0-3): Not on file  Financial Resource Strain: Low Risk   . Difficulty of Paying Living Expenses: Not hard at all  Food Insecurity: Not on file  Housing: Not on file  Physical Activity: Not on file  Social Connections: Not on file  Stress: Not on file  Tobacco Use: Medium Risk  . Smoking Tobacco Use: Former Smoker  . Smokeless Tobacco Use: Never Used  Transportation Needs: No Transportation Needs  . Lack of Transportation (Medical): No  . Lack of Transportation (Non-Medical): No    CCM Care Plan  Allergies  Allergen Reactions  . Lisinopril     REACTION: cough  . Risperidone     REACTION: unsure of dose; pt describes reaction of bad cough    Medications Reviewed Today    Reviewed by [name], [name], MD (Physician) on 05/31/20 at 0847  Med List Status: <None>  Medication Order Taking? Sig Documenting Provider Last Dose Status Informant  Bempedoic Acid (NEXLETOL) 180 MG TABS [id number] Yes Take 1 tablet by mouth daily. Pixie Casino, MD Taking Active   Cholecalciferol (VITAMIN D3) 125 MCG (5000 UT) CAPS  [id number] Yes Take 1 capsule by mouth daily. [provider] Taking Active Self  Evolocumab (REPATHA SURECLICK) 893 MG/ML SOAJ [id number] Yes Inject 1 Dose into the skin every 14 (fourteen) days. Pixie Casino, MD Taking Active   fenofibrate (TRICOR) 145 MG tablet [id number] Yes TAKE 1 TABLET BY MOUTH  DAILY [name], [name], MD Taking Active   hydrochlorothiazide (HYDRODIURIL) 25 MG tablet [id number] Yes TAKE 1 TABLET BY MOUTH  DAILY [name], [name], MD Taking Active   losartan-hydrochlorothiazide [facility] 50-12.5 MG tablet [id number] Yes Take 1 tablet by mouth daily. [name], [name], MD Taking Active   Multiple Vitamins-Minerals (ONE DAILY WOMENS 50 PLUS) TABS  [id number] Yes Take by mouth. Centrum Womens 50 plus MVI [provider] Taking Active   naproxen sodium (ALEVE) 220 MG tablet [id number] Yes Take 220 mg by mouth as needed.  [provider] Taking Active         Discontinued 05/31/20 0825 (Completed Course)           Patient Active Problem List   Diagnosis Date Noted  . Vitamin D deficiency 10/26/2018  . Hx of adenomatous polyp of colon 03/11/2018  . Constipation 02/10/2018  . Impaired glucose tolerance 10/02/2015  . ANXIETY 06/04/2009  . MYOCARDIAL PERFUSION SCAN, WITH STRESS TEST, ABNORMAL 10/19/2008  . PREMATURE VENTRICULAR CONTRACTIONS 08/14/2008  . GERD 08/14/2008  . Goiter 01/04/2008  . Dyslipidemia 09/14/2006  . Essential hypertension 09/14/2006    Immunization History  Administered Date(s) Administered  . Influenza Split 12/24/2010, 12/17/2011  . Influenza,inj,Quad PF,6+ Mos 01/07/2013  . PFIZER(Purple Top)SARS-COV-2 Vaccination 07/19/2019, 08/09/2019, 02/21/2020  . Tdap 08/17/2014  . Zoster Recombinat (Shingrix) 10/26/2018, 02/10/2019  HTN, HLD, Impaired glucose intolerance, Constipation, Pain, vitamin D Deficiency  Conditions to be addressed/monitored:  Hypertension, Hyperlipidemia and constipation, pain,  pre-diabetes  Care Plan : [place]  Updates made by Viona Gilmore, [place] since 06/21/2020 12:00 AM    Problem: Problem: Hypertension, Hyperlipidemia and constipation, pain, pre-diabetes     Long-Range Goal: Patient-Specific Goal   Start Date: 06/14/2020  Expected End Date: 06/14/2021  This Visit's Progress: On track  Priority: High  Note:   Current Barriers:  . Unable to achieve control of triglycerides   Pharmacist Clinical Goal(s):  Marland Kitchen Patient will achieve control of triglycerides as evidenced by next lipid panel  through collaboration with PharmD and provider.   Interventions: . 1:1 collaboration with [name], [name], MD regarding development and update of comprehensive plan of care as evidenced by provider attestation and co-signature . Inter-disciplinary care team collaboration (see longitudinal plan of care) . Comprehensive medication review performed; medication list updated in electronic medical record  Hypertension (BP goal <140/90) -Not ideally controlled -Current treatment: . Losartan-hydrochlorothiazide 50-62m, 1 tablet once daily -Medications previously tried: olmesartan (cost)  -Current home readings: not checking regularly -Current dietary habits: did not discuss -Current exercise habits: walking 2 miles a day 4 days a week -Denies hypotensive/hypertensive symptoms -Educated on Exercise goal of 150 minutes per week; Importance of home blood pressure monitoring; -Counseled to monitor BP at home weekly, document, and provide log at future appointments -Counseled on diet and exercise extensively Recommended to continue current medication  Hyperlipidemia: (LDL goal < 100) -Controlled -Current treatment:  bempedoic acid (Nexletol) 1853m 1 tablet once daily  Fenofibrate 14551m1 tablet once daily   Repatha 140 mg inject every 14 days -Medications previously tried: atorvastatin, pravastatin, ezetimibe  -Current dietary patterns:  limiting fatty foods; does admit to overeating often -Current exercise habits: walking 4 days a week -Educated on Cholesterol goals;  Importance of limiting foods high in cholesterol; Exercise goal of 150 minutes per week; -Recommended to continue current medication Counseled on foods that can increase triglycerides and patient requested a referral for a dietician  Pre-diabetes (A1c goal <6.5%) -Controlled -Current medications: . No medications -Medications previously tried: none  -Current home glucose readings . fasting glucose: n/a . post prandial glucose: n/a -Denies hypoglycemic/hyperglycemic symptoms -Current meal patterns:  . breakfast: did not discuss  . lunch: did not discuss   . dinner: did not discuss  . snacks: did not discuss  . drinks: did not discuss  -  Current exercise: walking 4 days a week -Educated on A1c and blood sugar goals; Exercise goal of 150 minutes per week; Carbohydrate counting and/or plate method -Counseled to check feet daily and get yearly eye exams -Counseled on diet and exercise extensively Congratulated patient on improved A1c  Constipation (Goal: regular bowel movements) -Controlled -Current treatment  . Miralax as needed - primarily using this . Colace as needed  Psyllium powder, 2 tablespoons daily as needed -Medications previously tried: n/a  -Counseled on increasing water intake and fiber intake  Pain (Goal: minimize symptoms) -Controlled -Current treatment  . Naproxen 241m, 2 tablets as needed -Medications previously tried: n/a  -Recommended to continue current medication  Vitamin D deficiency (Goal: vitamin D 30-100) -Controlled -Current treatment  . Vitamin D3, 5000 units, 1 tablet once daily  -Medications previously tried: none  -Recommended repeat vitamin D level  Health Maintenance -Vaccine gaps: Prevnar, influenza - patient declines both vaccines at this time -Current therapy:  . Multivitamin 1 tablet  daily . Probiotics daily -Educated on Cost vs benefit of each product must be carefully weighed by individual consumer -Patient is satisfied with current therapy and denies issues -Recommended to continue current medication  Patient Goals/Self-Care Activities . Patient will:  - check blood pressure weekly, document, and provide at future appointments target a minimum of 150 minutes of moderate intensity exercise weekly engage in dietary modifications by increasing fiber intake and avoid overeating  Follow Up Plan: Telephone follow up appointment with care management team member scheduled for: 6 months       Medication Assistance: Repatha and Nexletol obtained through [place].  Enrollment ends when grant is completed  [place]  [place] [address]Phone: [phone]: [phone] Uses pill box? No - takes all medications in the morning Pt endorses 100% compliance  We discussed: Current pharmacy is preferred with insurance plan and patient is satisfied with pharmacy services Patient decided to: Continue current medication management strategy  Care Plan and Follow Up Patient Decision:  Patient agrees to Care Plan and Follow-up.  Plan: Telephone follow up appointment with care management team member scheduled for:  5 months  MJeni Salles PharmD BPierre PartPharmacist LClarksvilleat [phone]

## 2020-06-21 NOTE — Patient Instructions (Addendum)
Hi Vanessa Price,  As always, it was great getting to speak with you! Below is a summary of some of the topics we discussed and I have attached some more information about lowering triglycerides through diet changes.  I also sent a message to Dr. Jerilee Hoh about entering the referral for a dietician that we discussed.  I will plan on reaching back out to you in about 5 months but will plan on having my assistance call you in a couple months to see how things are going and to check in on your blood pressure again.  Please reach out to me if you have any questions or need anything before our follow up!  Best, Maddie  [name], PharmD, [facility]  Visit Information  Goals Addressed   None    Patient Care Plan: CCM Pharmacy Care Plan    Problem Identified: Problem: Hypertension, Hyperlipidemia and constipation, pain, pre-diabetes     Long-Range Goal: Patient-Specific Goal   Start Date: 06/14/2020  Expected End Date: 06/14/2021  This Visit's Progress: On track  Priority: High  Note:   Current Barriers:  . Unable to achieve control of triglycerides   Pharmacist Clinical Goal(s):  Marland Kitchen Patient will achieve control of triglycerides as evidenced by next lipid panel  through collaboration with PharmD and provider.   Interventions: . 1:1 collaboration with [name], [name], MD regarding development and update of comprehensive plan of care as evidenced by provider attestation and co-signature . Inter-disciplinary care team collaboration (see longitudinal plan of care) . Comprehensive medication review performed; medication list updated in electronic medical record  Hypertension (BP goal <140/90) -Not ideally controlled -Current treatment: . Losartan-hydrochlorothiazide 50-25mg , 1 tablet once daily -Medications previously tried: olmesartan (cost)  -Current home readings: not checking regularly -Current dietary  habits: did not discuss -Current exercise habits: walking 2 miles a day 4 days a week -Denies hypotensive/hypertensive symptoms -Educated on Exercise goal of 150 minutes per week; Importance of home blood pressure monitoring; -Counseled to monitor BP at home weekly, document, and provide log at future appointments -Counseled on diet and exercise extensively Recommended to continue current medication  Hyperlipidemia: (LDL goal < 100) -Controlled -Current treatment:  bempedoic acid (Nexletol) 180mg , 1 tablet once daily  Fenofibrate 145mg , 1 tablet once daily   Repatha 140 mg inject every 14 days -Medications previously tried: atorvastatin, pravastatin, ezetimibe  -Current dietary patterns: limiting fatty foods; does admit to overeating often -Current exercise habits: walking 4 days a week -Educated on Cholesterol goals;  Importance of limiting foods high in cholesterol; Exercise goal of 150 minutes per week; -Recommended to continue current medication Counseled on foods that can increase triglycerides and patient requested a referral for a dietician  Pre-diabetes (A1c goal <6.5%) -Controlled -Current medications: . No medications -Medications previously tried: none  -Current home glucose readings . fasting glucose: n/a . post prandial glucose: n/a -Denies hypoglycemic/hyperglycemic symptoms -Current meal patterns:  . breakfast: did not discuss  . lunch: did not discuss   . dinner: did not discuss  . snacks: did not discuss  . drinks: did not discuss  -Current exercise: walking 4 days a week -Educated on A1c and blood sugar goals; Exercise goal of 150 minutes per week; Carbohydrate counting and/or plate method -Counseled to check feet daily and get yearly eye exams -Counseled on diet and exercise extensively Congratulated patient on improved A1c  Constipation (Goal: regular bowel movements) -Controlled -Current treatment  . Miralax as needed - primarily using  this . Colace as needed  Psyllium powder, 2 tablespoons daily as needed -Medications previously tried: n/a  -Counseled on increasing water intake and fiber intake  Pain (Goal: minimize symptoms) -Controlled -Current treatment  . Naproxen 220mg , 2 tablets as needed -Medications previously tried: n/a  -Recommended to continue current medication  Vitamin D deficiency (Goal: vitamin D 30-100) -Controlled -Current treatment  . Vitamin D3, 5000 units, 1 tablet once daily  -Medications previously tried: none  -Recommended repeat vitamin D level  Health Maintenance -Vaccine gaps: Prevnar, influenza - patient declines both vaccines at this time -Current therapy:  . Multivitamin 1 tablet daily . Probiotics daily -Educated on Cost vs benefit of each product must be carefully weighed by individual consumer -Patient is satisfied with current therapy and denies issues -Recommended to continue current medication  Patient Goals/Self-Care Activities . Patient will:  - check blood pressure weekly, document, and provide at future appointments target a minimum of 150 minutes of moderate intensity exercise weekly engage in dietary modifications by increasing fiber intake and avoid overeating  Follow Up Plan: Telephone follow up appointment with care management team member scheduled for: 6 months       Patient verbalizes understanding of instructions provided today and agrees to view in [place].  Telephone follow up appointment with pharmacy team member scheduled for: 5 months  Viona Gilmore, [facility]  High Triglycerides Eating Plan Triglycerides are a type of fat in the blood. High levels of triglycerides can increase your risk of heart disease and stroke. If your triglyceride levels are high, choosing the right foods can help lower your triglycerides and keep your heart healthy. Work with your health care provider or a diet and nutrition specialist (dietitian) to develop an eating plan that  is right for you. What are tips for following this plan? General guidelines  Lose weight, if you are overweight. For most people, losing 5-10 lbs (2-5 kg) helps lower triglyceride levels. A weight-loss plan may include. ? 30 minutes of exercise at least 5 days a week. ? Reducing the amount of calories, sugar, and fat you eat.  Eat a wide variety of fresh fruits, vegetables, and whole grains. These foods are high in fiber.  Eat foods that contain healthy fats, such as fatty fish, nuts, seeds, and olive oil.  Avoid foods that are high in added sugar, added salt (sodium), saturated fat, and trans fat.  Avoid low-fiber, refined carbohydrates such as white bread, crackers, noodles, and white rice.  Avoid foods with partially hydrogenated oils (trans fats), such as fried foods or stick margarine.  Limit alcohol intake to no more than 1 drink a day for nonpregnant women and 2 drinks a day for men. One drink equals 12 oz of beer, 5 oz of wine, or 1 oz of hard liquor. Your health care provider may recommend that you drink less depending on your overall health.   Reading food labels  Check food labels for the amount of saturated fat. Choose foods with no or very little saturated fat.  Check food labels for the amount of trans fat. Choose foods with no trans fat.  Check food labels for the amount of cholesterol. Choose foods low in cholesterol. Ask your dietitian how much cholesterol you should have each day.  Check food labels for the amount of sodium. Choose foods with less than 140 milligrams (mg) per serving. Shopping  Buy dairy products labeled as nonfat (skim) or low-fat (1%).  Avoid buying processed or prepackaged foods. These are  often high in added sugar, sodium, and fat. Cooking  Choose healthy fats when cooking, such as olive oil or canola oil.  Cook foods using lower fat methods, such as baking, broiling, boiling, or grilling.  Make your own sauces, dressings, and marinades  when possible, instead of buying them. Store-bought sauces, dressings, and marinades are often high in sodium and sugar. Meal planning  Eat more home-cooked food and less restaurant, buffet, and fast food.  Eat fatty fish at least 2 times each week. Examples of fatty fish include salmon, trout, mackerel, tuna, and herring.  If you eat whole eggs, do not eat more than 3 egg yolks per week. What foods are recommended? The items listed may not be a complete list. Talk with your dietitian about what dietary choices are best for you. Grains Whole wheat or whole grain breads, crackers, cereals, and pasta. Unsweetened oatmeal. Bulgur. Barley. Quinoa. Brown rice. Whole wheat flour tortillas. Vegetables Fresh or frozen vegetables. Low-sodium canned vegetables. Fruits All fresh, canned (in natural juice), or frozen fruits. Meats and other protein foods Skinless chicken or [place]. Ground chicken or [place]. Lean cuts of pork, trimmed of fat. Fish and seafood, especially salmon, trout, and herring. Egg whites. Dried beans, peas, or lentils. Unsalted nuts or seeds. Unsalted canned beans. Natural peanut or almond butter. Dairy Low-fat dairy products. Skim or low-fat (1%) milk. Reduced fat (2%) and low-sodium cheese. Low-fat ricotta cheese. Low-fat cottage cheese. Plain, low-fat yogurt. Fats and oils Tub margarine without trans fats. Light or reduced-fat mayonnaise. Light or reduced-fat salad dressings. Avocado. Safflower, olive, sunflower, soybean, and canola oils. What foods are not recommended? The items listed may not be a complete list. Talk with your dietitian about what dietary choices are best for you. Grains White bread. White (regular) pasta. White rice. Cornbread. Bagels. Pastries. Crackers that contain trans fat. Vegetables Creamed or fried vegetables. Vegetables in a cheese sauce. Fruits Sweetened dried fruit. Canned fruit in syrup. Fruit juice. Meats and other protein foods Fatty cuts of  meat. Ribs. Chicken wings. Berniece Salines. Sausage. Bologna. Salami. Chitterlings. Fatback. Hot dogs. Bratwurst. Packaged lunch meats. Dairy Whole or reduced-fat (2%) milk. Half-and-half. Cream cheese. Full-fat or sweetened yogurt. Full-fat cheese. Nondairy creamers. Whipped toppings. Processed cheese or cheese spreads. Cheese curds. Beverages Alcohol. Sweetened drinks, such as soda, lemonade, fruit drinks, or punches. Fats and oils Butter. Stick margarine. Lard. Shortening. Ghee. Bacon fat. Tropical oils, such as coconut, palm kernel, or palm oils. Sweets and desserts Corn syrup. Sugars. Honey. Molasses. Candy. Jam and jelly. Syrup. Sweetened cereals. Cookies. Pies. Cakes. Donuts. Muffins. Ice cream. Condiments Store-bought sauces, dressings, and marinades that are high in sugar, such as ketchup and barbecue sauce. Summary  High levels of triglycerides can increase the risk of heart disease and stroke. Choosing the right foods can help lower your triglycerides.  Eat plenty of fresh fruits, vegetables, and whole grains. Choose low-fat dairy and lean meats. Eat fatty fish at least twice a week.  Avoid processed and prepackaged foods with added sugar, sodium, saturated fat, and trans fat.  If you need suggestions or have questions about what types of food are good for you, talk with your health care provider or a dietitian. This information is not intended to replace advice given to you by your health care provider. Make sure you discuss any questions you have with your health care provider. Document Revised: 07/13/2019 Document Reviewed: 07/13/2019 Elsevier Patient Education  2021 [employer].

## 2020-06-22 ENCOUNTER — Telehealth: Payer: Self-pay | Admitting: *Deleted

## 2020-06-22 DIAGNOSIS — E785 Hyperlipidemia, unspecified: Secondary | ICD-10-CM

## 2020-06-22 NOTE — Telephone Encounter (Signed)
-----   Message from Erline Hau, MD sent at 06/22/2020  7:13 AM EDT ----- Regarding: FW: Dietician/nutritionist referral [place] for dietitian referral.  ----- Message ----- From: Viona Gilmore, [facility] Sent: 06/21/2020   1:53 PM EDT To: Lamarr Lulas, CMA, # Subject: Dietician/nutritionist referral                Hi,  I wasn't sure who to send this request to so I included both of you! When I spoke with Ms. Lydon, we spent a great deal of time talking about her elevated triglycerides. She inquired if a referral could be made for a dietician or nutritionist to discuss this further? She would prefer if it was geared more towards diet changes for her cholesterol. Could a referral be sent in for this specifically? Wasn't sure if it was possible.  Let me know!  Thanks, Maddie

## 2020-06-22 NOTE — Telephone Encounter (Signed)
Referral placed.

## 2020-08-01 ENCOUNTER — Encounter: Payer: Self-pay | Admitting: Internal Medicine

## 2020-08-01 ENCOUNTER — Other Ambulatory Visit: Payer: Self-pay

## 2020-08-01 ENCOUNTER — Ambulatory Visit: Payer: Medicare Other | Admitting: Internal Medicine

## 2020-08-01 VITALS — BP 126/68 | HR 98 | Ht 64.0 in | Wt 201.6 lb

## 2020-08-01 DIAGNOSIS — T466X5A Adverse effect of antihyperlipidemic and antiarteriosclerotic drugs, initial encounter: Secondary | ICD-10-CM

## 2020-08-01 DIAGNOSIS — E785 Hyperlipidemia, unspecified: Secondary | ICD-10-CM

## 2020-08-01 DIAGNOSIS — M791 Myalgia, unspecified site: Secondary | ICD-10-CM | POA: Diagnosis not present

## 2020-08-01 DIAGNOSIS — T466X5D Adverse effect of antihyperlipidemic and antiarteriosclerotic drugs, subsequent encounter: Secondary | ICD-10-CM

## 2020-08-01 MED ORDER — OMEGA-3-ACID ETHYL ESTERS 1 G PO CAPS: 2.0000 g | ORAL_CAPSULE | Freq: Two times a day (BID) | ORAL | 3 refills | Status: DC

## 2020-08-01 NOTE — Patient Instructions (Signed)
Medication Instructions:  STOP nexletol  START lovaza 2 capsules twice daily CONTINUE all other current medications  *If you need a refill on your cardiac medications before your next appointment, please call your pharmacy*   Lab Work: FASTING lab work in 6 months to check cholesterol   If you have labs (blood work) drawn today and your tests are completely normal, you will receive your results only by: Marland Kitchen MyChart Message (if you have MyChart) OR . A paper copy in the mail If you have any lab test that is abnormal or we need to change your treatment, we will call you to review the results.   Testing/Procedures: NONE   Follow-Up: At [facility], you and your health needs are our priority.  As part of our continuing mission to provide you with exceptional heart care, we have created designated Provider Care Teams.  These Care Teams include your primary Cardiologist (physician) and Advanced Practice Providers (APPs -  Physician Assistants and Nurse Practitioners) who all work together to provide you with the care you need, when you need it.  We recommend signing up for the patient portal called "MyChart".  Sign up information is provided on this After Visit Summary.  MyChart is used to connect with patients for Virtual Visits (Telemedicine).  Patients are able to view lab/test results, encounter notes, upcoming appointments, etc.  Non-urgent messages can be sent to your provider as well.   To learn more about what you can do with MyChart, go to [email or web address].    Your next appointment:   6 month(s)  The format for your next appointment:   In Person  Provider:   K. Mali Hilty, MD   Other Instructions

## 2020-08-01 NOTE — Progress Notes (Signed)
LIPID CLINIC CONSULT NOTE  Chief Complaint:  Manage dyslipidemia  Primary Care Physician: [name], [name], MD  Primary Cardiologist:  None  HPI:  Vanessa Price is a 65 y.o. female who is being seen today for the evaluation of dyslipidemia at the request of [name], [place]*.  This is a pleasant 65 year old female kindly referred by Dr. Jerilee Hoh for evaluation and management of dyslipidemia, specifically elevated triglycerides.  Most recently lipid profile showed total cholesterol of 249, triglycerides 293 (as high as 774 in the past), HDL 39, and LDL of 174.  Unfortunately, she has been intolerant to statins, having failed multiple statins in the past over 20+ years of therapy.  She is currently on fenofibrate which has resulted in marked improvement in her triglycerides however again her LDL cholesterol remains quite high.  There is no known coronary disease.  There is a family history of heart disease.  10/12/2019  Vanessa Price returns today for follow-up.  She had coronary calcium scoring which was 0 although has other risk factors including hypertension, obesity, prediabetes and at least intermediate cardiovascular risk factors.  I recommended a target LDL less than 100.  She was started on bempedoic acid (Nexletol) due to the fact that she did not tolerate multiple statins due to myalgia as well as ezetimibe.  She has had some reduction in her cholesterol however not significant.  Total cholesterol is now 2 and 37, triglycerides 279, HDL 32 and LDL 153 (down from 177).  Since she is not able to reach target, I feel that we will need additional therapy.  She is also on fenofibrate 145 mg daily.  08/01/2020  Vanessa Price is seen today in follow-up.  Her cholesterol has come down substantially with the addition of Repatha.  Direct LDL is now 41, total cholesterol 111, triglycerides remain elevated at 320 and HDL low at 38.  She reports that the  bempedoic acid is quite expensive.  PMHx:  Past Medical History:  Diagnosis Date  . ANXIETY 06/04/2009  . GERD 08/14/2008   occasional  . Hx of adenomatous polyp of colon 03/11/2018  . HYPERLIPIDEMIA 09/14/2006  . HYPERTENSION 09/14/2006  . Impaired glucose tolerance   . MYOCARDIAL PERFUSION SCAN, WITH STRESS TEST, ABNORMAL 10/19/2008  . PREMATURE VENTRICULAR CONTRACTIONS 08/14/2008    Past Surgical History:  Procedure Laterality Date  . ABDOMINAL HYSTERECTOMY  1999   menorrhagia/partial  . CESAREAN SECTION     2 times    FAMHx:  Family History  Problem Relation Age of Onset  . Arthritis Mother   . Diabetes Mother   . Heart disease Mother   . Mental illness Father   . Cancer Brother        Lung    SOCHx:   reports that she quit smoking about 27 years ago. She has never used smokeless tobacco. She reports current alcohol use of about 1.0 standard drink of alcohol per week. She reports that she does not use drugs.  ALLERGIES:  Allergies  Allergen Reactions  . Lisinopril     REACTION: cough  . Risperidone     REACTION: unsure of dose; pt describes reaction of bad cough    ROS: Pertinent items noted in HPI and remainder of comprehensive ROS otherwise negative.  HOME MEDS: Current Outpatient Medications on File Prior to Visit  Medication Sig Dispense Refill  . Bempedoic Acid (NEXLETOL) 180 MG TABS Take 1 tablet by mouth daily. 90 tablet 3  . Evolocumab (REPATHA SURECLICK)  140 MG/ML SOAJ Inject 1 Dose into the skin every 14 (fourteen) days. 2 pen 11  . fenofibrate (TRICOR) 145 MG tablet TAKE 1 TABLET BY MOUTH  DAILY 90 tablet 1  . losartan-hydrochlorothiazide (HYZAAR) 50-12.5 MG tablet Take 1 tablet by mouth daily. 90 tablet 1  . Multiple Vitamins-Minerals (ONE DAILY WOMENS 50 PLUS) TABS Take by mouth. Centrum Womens 50 plus MVI    . naproxen sodium (ALEVE) 220 MG tablet Take 220 mg by mouth as needed.      No current facility-administered medications on file prior  to visit.    LABS/IMAGING: No results found for this or any previous visit (from the past 48 hour(s)). No results found.  LIPID PANEL:    Component Value Date/Time   CHOL 111 05/31/2020 0854   CHOL 237 (H) 10/07/2019 1113   TRIG 320.0 (H) 05/31/2020 0854   TRIG 142 03/03/2006 0906   HDL 38.30 (L) 05/31/2020 0854   HDL 32 (L) 10/07/2019 1113   CHOLHDL 3 05/31/2020 0854   VLDL 64.0 (H) 05/31/2020 0854   LDLCALC 153 (H) 10/07/2019 1113   LDLDIRECT 41.0 05/31/2020 0854    WEIGHTS: Wt Readings from Last 3 Encounters:  08/01/20 201 lb 9.6 oz (91.4 kg)  05/31/20 199 lb 1.6 oz (90.3 kg)  01/12/20 200 lb (90.7 kg)    VITALS: BP 126/68   Pulse 98   Ht 5\' 4"  (1.626 m)   Wt 201 lb 9.6 oz (91.4 kg)   SpO2 98%   BMI 34.60 kg/m   EXAM: Deferred  EKG: Deferred  ASSESSMENT: 1. Mixed dyslipidemia, possible familial combined hyperlipidemia [facility]) 2. Intermediate 10 year risk (goal LDL <100) 3. Hypertension 4. Statin and zetia intolerance-myalgias  PLAN: 1.   Vanessa Price has had substantial decrease in her LDL cholesterol which is now well treated.  At this point, I would recommend stopping her Nexletol and will add Lovaza 2 g twice daily.  She will continue with her fibrate and the Repatha.  Follow-up with me in 6 months.  Pixie Casino, MD, [facility], [place] Director of the Advanced Lipid Disorders &  Cardiovascular Risk Reduction Clinic Diplomate of the American Board of Clinical Lipidology Attending Cardiologist  Direct Dial: [phone]  Fax: [phone]  Website:  [email or web address] 08/01/2020, 11:07 AM

## 2020-08-06 ENCOUNTER — Ambulatory Visit (INDEPENDENT_AMBULATORY_CARE_PROVIDER_SITE_OTHER): Payer: Medicare Other | Admitting: Family Medicine

## 2020-08-06 ENCOUNTER — Other Ambulatory Visit: Payer: Self-pay

## 2020-08-06 ENCOUNTER — Encounter: Payer: Self-pay | Admitting: Family Medicine

## 2020-08-06 VITALS — BP 138/88 | HR 83 | Temp 98.4°F | Wt 197.8 lb

## 2020-08-06 DIAGNOSIS — R0982 Postnasal drip: Secondary | ICD-10-CM

## 2020-08-06 DIAGNOSIS — R058 Other specified cough: Secondary | ICD-10-CM | POA: Diagnosis not present

## 2020-08-06 MED ORDER — FLUTICASONE PROPIONATE 50 MCG/ACT NA SUSP: 1.0000 | Freq: Every day | NASAL | 0 refills | Status: DC

## 2020-08-06 MED ORDER — BENZONATATE 100 MG PO CAPS: 100.0000 mg | ORAL_CAPSULE | Freq: Two times a day (BID) | ORAL | 0 refills | Status: DC | PRN

## 2020-08-06 NOTE — Patient Instructions (Signed)
Cough, Adult Coughing is a reflex that clears your throat and your airways (respiratory system). Coughing helps to heal and protect your lungs. It is normal to cough occasionally, but a cough that happens with other symptoms or lasts a long time may be a sign of a condition that needs treatment. An acute cough may only last 2-3 weeks, while a chronic cough may last 8 or more weeks. Coughing is commonly caused by:  Infection of the respiratory systemby viruses or bacteria.  Breathing in substances that irritate your lungs.  Allergies.  Asthma.  Mucus that runs down the back of your throat (postnasal drip).  Smoking.  Acid backing up from the stomach into the esophagus (gastroesophageal reflux).  Certain medicines.  Chronic lung problems.  Other medical conditions such as heart failure or a blood clot in the lung (pulmonary embolism). Follow these instructions at home: Medicines  Take over-the-counter and prescription medicines only as told by your health care provider.  Talk with your health care provider before you take a cough suppressant medicine. Lifestyle  Avoid cigarette smoke. Do not use any products that contain nicotine or tobacco, such as cigarettes, e-cigarettes, and chewing tobacco. If you need help quitting, ask your health care provider.  Drink enough fluid to keep your urine pale yellow.  Avoid caffeine.  Do not drink alcohol if your health care provider tells you not to drink.   General instructions  Pay close attention to changes in your cough. Tell your health care provider about them.  Always cover your mouth when you cough.  Avoid things that make you cough, such as perfume, candles, cleaning products, or campfire or tobacco smoke.  If the air is dry, use a cool mist vaporizer or humidifier in your bedroom or your home to help loosen secretions.  If your cough is worse at night, try to sleep in a semi-upright position.  Rest as needed.  Keep all  follow-up visits as told by your health care provider. This is important.   Contact a health care provider if you:  Have new symptoms.  Cough up pus.  Have a cough that does not get better after 2-3 weeks or gets worse.  Cannot control your cough with cough suppressant medicines and you are losing sleep.  Have pain that gets worse or pain that is not helped with medicine.  Have a fever.  Have unexplained weight loss.  Have night sweats. Get help right away if:  You cough up blood.  You have difficulty breathing.  Your heartbeat is very fast. These symptoms may represent a serious problem that is an emergency. Do not wait to see if the symptoms will go away. Get medical help right away. Call your local emergency services (911 in the [place] Do not drive yourself to the hospital. Summary  Coughing is a reflex that clears your throat and your airways. It is normal to cough occasionally, but a cough that happens with other symptoms or lasts a long time may be a sign of a condition that needs treatment.  Take over-the-counter and prescription medicines only as told by your health care provider.  Always cover your mouth when you cough.  Contact a health care provider if you have new symptoms or a cough that does not get better after 2-3 weeks or gets worse. This information is not intended to replace advice given to you by your health care provider. Make sure you discuss any questions you have with your health care provider. Document   Revised: 03/29/2018 Document Reviewed: 03/29/2018 Elsevier Patient Education  2021 [place] &[employer]; Helane Rima of Respiratory Medicine (7th ed., pp. [phone]). Elsevier.">  Postnasal Drip Postnasal drip is the feeling of mucus going down the back of your throat. Mucus is a slimy substance that moistens and cleans your nose and throat, as well as the air pockets in face bones near your forehead and cheeks (sinuses). Small amounts of  mucus pass from your nose and sinuses down the back of your throat all the time. This is normal. When you produce too much mucus or the mucus gets too thick, you can feel it. Some common causes of postnasal drip include:  Having more mucus because of: ? A cold or the flu. ? Allergies. ? Cold air. ? Certain medicines.  Having more mucus that is thicker because of: ? A sinus or nasal infection. ? Dry air. ? A food allergy. Follow these instructions at home: Relieving discomfort  Gargle with a salt-water mixture 3-4 times a day or as needed. To make a salt-water mixture, completely dissolve -1 tsp of salt in 1 cup of warm water.  If the air in your home is dry, use a humidifier to add moisture to the air.  Use a saline spray or container (neti pot) to flush out the nose (nasal irrigation). These methods can help clear away mucus and keep the nasal passages moist.   General instructions  Take over-the-counter and prescription medicines only as told by your health care provider.  Follow instructions from your health care provider about eating or drinking restrictions. You may need to avoid caffeine.  Avoid things that you know you are allergic to (allergens), like dust, mold, pollen, pets, or certain foods.  Drink enough fluid to keep your urine pale yellow.  Keep all follow-up visits as told by your health care provider. This is important. Contact a health care provider if:  You have a fever.  You have a sore throat.  You have difficulty swallowing.  You have headache.  You have sinus pain.  You have a cough that does not go away.  The mucus from your nose becomes thick and is green or yellow in color.  You have cold or flu symptoms that last more than 10 days. Summary  Postnasal drip is the feeling of mucus going down the back of your throat.  If your health care provider approves, use nasal irrigation or a nasal spray 2?4 times a day.  Avoid things that you know  you are allergic to (allergens), like dust, mold, pollen, pets, or certain foods. This information is not intended to replace advice given to you by your health care provider. Make sure you discuss any questions you have with your health care provider. Document Revised: 12/20/2019 Document Reviewed: 12/20/2019 Elsevier Patient Education  2021 [employer].

## 2020-08-06 NOTE — Progress Notes (Signed)
Subjective:    Patient ID: Vanessa Price, female    DOB: November 19, 1955, 65 y.o.   MRN: [id number]  Chief Complaint  Patient presents with  . Cough    Started last Monday, body aches, fever, just feeling bad, cough started Monday night when laying in bed. Took 2 home COVID test, both neg. Coughing occurs mostly when laying down. Cough is productive of clear phlem, has little wheezing and some raspiness, patient stated.tried Nyquil and theraflu, it helped a little. Wants to make sure she does not have fluid in lungs    HPI Patient was seen today for acute concern.  Pt had chills and achiness last Sunday, 5/8.  She then developed intermittent fever T-max 102.9 F and and cough occurring mostly at night.  Cough productive with clearish white phlegm.  Patient also had decreased appetite, headache, and fatigue.  Patient tried NyQuil, Mucinex, TheraFlu.  Denies sick contacts.  Patient took 2 home COVID test which were negative. Pt has a netti pot at home, but has not used it.  Past Medical History:  Diagnosis Date  . ANXIETY 06/04/2009  . GERD 08/14/2008   occasional  . Hx of adenomatous polyp of colon 03/11/2018  . HYPERLIPIDEMIA 09/14/2006  . HYPERTENSION 09/14/2006  . Impaired glucose tolerance   . MYOCARDIAL PERFUSION SCAN, WITH STRESS TEST, ABNORMAL 10/19/2008  . PREMATURE VENTRICULAR CONTRACTIONS 08/14/2008    Allergies  Allergen Reactions  . Lisinopril     REACTION: cough  . Risperidone     REACTION: unsure of dose; pt describes reaction of bad cough   Family History  Problem Relation Age of Onset  . Arthritis Mother   . Diabetes Mother   . Heart disease Mother   . Mental illness Father   . Cancer Brother        Lung     ROS General: Denies night sweats, changes in weight  +fever, chills, decreased appetite, fatigue, achiness HEENT: Denies ear pain, changes in vision, rhinorrhea, sore throat  +HA CV: Denies CP, palpitations, SOB, orthopnea Pulm: Denies SOB, wheezing   +cough GI: Denies abdominal pain, nausea, vomiting, diarrhea, constipation GU: Denies dysuria, hematuria, frequency, vaginal discharge Msk: Denies muscle cramps, joint pains Neuro: Denies weakness, numbness, tingling Skin: Denies rashes, bruising Psych: Denies depression, anxiety, hallucinations    Objective:    Blood pressure 138/88, pulse 83, temperature 98.4 F (36.9 C), temperature source Oral, weight 197 lb 12.8 oz (89.7 kg), SpO2 96 %.  Gen. Pleasant, well-nourished, in no distress, normal affect  HEENT: /AT, face symmetric, conjunctiva clear, no scleral icterus, PERRLA, EOMI, nares patent with erytheama, no drainage.  TMs normal.  No cervical lymphadenopathy. Lungs: no accessory muscle use, CTAB, no wheezes or rales Cardiovascular: RRR, no m/r/g, no peripheral edema Musculoskeletal: No deformities, no cyanosis or clubbing, normal tone Neuro:  A&Ox3, CN II-XII intact, normal gait Skin:  Warm, no lesions/ rash  Wt Readings from Last 3 Encounters:  08/06/20 197 lb 12.8 oz (89.7 kg)  08/01/20 201 lb 9.6 oz (91.4 kg)  05/31/20 199 lb 1.6 oz (90.3 kg)    Lab Results  Component Value Date   WBC 4.8 10/26/2018   HGB 13.6 10/26/2018   HCT 41.8 10/26/2018   PLT 230.0 10/26/2018   GLUCOSE 99 05/31/2020   CHOL 111 05/31/2020   TRIG 320.0 (H) 05/31/2020   HDL 38.30 (L) 05/31/2020   LDLDIRECT 41.0 05/31/2020   LDLCALC 153 (H) 10/07/2019   ALT 36 (H) 05/31/2020   AST 30 05/31/2020  NA 143 05/31/2020   K 3.9 05/31/2020   CL 106 05/31/2020   CREATININE 0.92 05/31/2020   BUN 18 05/31/2020   CO2 27 05/31/2020   TSH 1.65 10/26/2018   HGBA1C 5.9 (A) 05/31/2020    Assessment/Plan:  Post-viral cough syndrome  -at home COVID test negative x 2 -discussed other possible viral causes such as influenza or acute nasopharyngitis. -continue treatment of symptoms -Send in Rx for Tessalon and Flonase. -Discussed proper nasal spray use -Given precautions - Plan: benzonatate  (TESSALON) 100 MG capsule  Post-nasal drainage  - Plan: fluticasone (FLONASE) 50 MCG/ACT nasal spray  F/u prn  Grier Mitts, MD

## 2020-08-09 NOTE — Telephone Encounter (Signed)
Tier exception completed on the phone by calling [facility]  Case: [id number]

## 2020-08-13 NOTE — Telephone Encounter (Signed)
Additional clinical information faxed to [facility] @ [phone] per faxed request

## 2020-08-16 ENCOUNTER — Telehealth: Payer: Self-pay | Admitting: Internal Medicine

## 2020-08-16 NOTE — Telephone Encounter (Signed)
New message:     Charleston Ropes from [facility] calling needing some clinical question to ask. Please call back at [phone] also she sent a fax on yesterday.

## 2020-08-17 NOTE — Telephone Encounter (Signed)
Left message for Charleston Ropes with [facility] clinical appeals review dept regarding tier exception for lovaza that was submitted over the phone and additional info faxed on 08/13/20

## 2020-08-21 NOTE — Telephone Encounter (Signed)
Attempted to call [facility] - no answer, voicemail

## 2020-08-22 NOTE — Telephone Encounter (Signed)
Attempted to call Vanessa Price with [facility] - left message to call back

## 2020-08-28 ENCOUNTER — Other Ambulatory Visit: Payer: Self-pay | Admitting: Internal Medicine

## 2020-08-28 DIAGNOSIS — E785 Hyperlipidemia, unspecified: Secondary | ICD-10-CM

## 2020-08-28 DIAGNOSIS — E781 Pure hyperglyceridemia: Secondary | ICD-10-CM

## 2020-08-30 ENCOUNTER — Other Ambulatory Visit: Payer: Self-pay

## 2020-08-31 ENCOUNTER — Encounter: Payer: Self-pay | Admitting: Internal Medicine

## 2020-08-31 ENCOUNTER — Ambulatory Visit (INDEPENDENT_AMBULATORY_CARE_PROVIDER_SITE_OTHER): Payer: Medicare Other | Admitting: Internal Medicine

## 2020-08-31 VITALS — BP 130/80 | HR 82 | Temp 98.1°F | Ht 64.5 in | Wt 197.8 lb

## 2020-08-31 DIAGNOSIS — K219 Gastro-esophageal reflux disease without esophagitis: Secondary | ICD-10-CM

## 2020-08-31 DIAGNOSIS — Z0000 Encounter for general adult medical examination without abnormal findings: Secondary | ICD-10-CM | POA: Diagnosis not present

## 2020-08-31 DIAGNOSIS — K5900 Constipation, unspecified: Secondary | ICD-10-CM

## 2020-08-31 DIAGNOSIS — E785 Hyperlipidemia, unspecified: Secondary | ICD-10-CM

## 2020-08-31 DIAGNOSIS — E559 Vitamin D deficiency, unspecified: Secondary | ICD-10-CM

## 2020-08-31 DIAGNOSIS — R7302 Impaired glucose tolerance (oral): Secondary | ICD-10-CM | POA: Diagnosis not present

## 2020-08-31 DIAGNOSIS — Z13820 Encounter for screening for osteoporosis: Secondary | ICD-10-CM

## 2020-08-31 DIAGNOSIS — I10 Essential (primary) hypertension: Secondary | ICD-10-CM

## 2020-08-31 DIAGNOSIS — E669 Obesity, unspecified: Secondary | ICD-10-CM

## 2020-08-31 LAB — LIPID PANEL
Cholesterol: 142 mg/dL (ref 0–200)
HDL: 44.4 mg/dL (ref >39.00)
LDL Cholesterol: 69 mg/dL (ref 0–99)
NonHDL: 97.46
Total CHOL/HDL Ratio: 3
Triglycerides: 141 mg/dL (ref 0.0–149.0)
VLDL: 28.2 mg/dL (ref 0.0–40.0)

## 2020-08-31 LAB — COMPREHENSIVE METABOLIC PANEL
ALT: 20 U/L (ref 0–35)
AST: 18 U/L (ref 0–37)
Albumin: 4.5 g/dL (ref 3.5–5.2)
Alkaline Phosphatase: 37 U/L — ABNORMAL LOW (ref 39–117)
BUN: 20 mg/dL (ref 6–23)
CO2: 27 mEq/L (ref 19–32)
Calcium: 9.8 mg/dL (ref 8.4–10.5)
Chloride: 108 mEq/L (ref 96–112)
Creatinine, Ser: 0.94 mg/dL (ref 0.40–1.20)
GFR: 63.09 mL/min (ref >60.00)
Glucose, Bld: 100 mg/dL — ABNORMAL HIGH (ref 70–99)
Potassium: 3.9 mEq/L (ref 3.5–5.1)
Sodium: 144 mEq/L (ref 135–145)
Total Bilirubin: 0.4 mg/dL (ref 0.2–1.2)
Total Protein: 6.8 g/dL (ref 6.0–8.3)

## 2020-08-31 LAB — CBC WITH DIFFERENTIAL/PLATELET
Basophils Absolute: 0 10*3/uL (ref 0.0–0.1)
Basophils Relative: 0.9 % (ref 0.0–3.0)
Eosinophils Absolute: 0.2 10*3/uL (ref 0.0–0.7)
Eosinophils Relative: 4.9 % (ref 0.0–5.0)
HCT: 40.4 % (ref 36.0–46.0)
Hemoglobin: 13.5 g/dL (ref 12.0–15.0)
Lymphocytes Relative: 46.6 % — ABNORMAL HIGH (ref 12.0–46.0)
Lymphs Abs: 2.1 10*3/uL (ref 0.7–4.0)
MCHC: 33.4 g/dL (ref 30.0–36.0)
MCV: 85.6 fl (ref 78.0–100.0)
Monocytes Absolute: 0.3 10*3/uL (ref 0.1–1.0)
Monocytes Relative: 6.9 % (ref 3.0–12.0)
Neutro Abs: 1.8 10*3/uL (ref 1.4–7.7)
Neutrophils Relative %: 40.7 % — ABNORMAL LOW (ref 43.0–77.0)
Platelets: 180 10*3/uL (ref 150.0–400.0)
RBC: 4.72 Mil/uL (ref 3.87–5.11)
RDW: 14.5 % (ref 11.5–15.5)
WBC: 4.5 10*3/uL (ref 4.0–10.5)

## 2020-08-31 LAB — TSH: TSH: 2.89 u[IU]/mL (ref 0.35–4.50)

## 2020-08-31 LAB — HEMOGLOBIN A1C: Hgb A1c MFr Bld: 6.2 % (ref 4.6–6.5)

## 2020-08-31 LAB — VITAMIN D 25 HYDROXY (VIT D DEFICIENCY, FRACTURES): VITD: 38.47 ng/mL (ref 30.00–100.00)

## 2020-08-31 LAB — VITAMIN B12: Vitamin B-12: 851 pg/mL (ref 211–911)

## 2020-08-31 NOTE — Patient Instructions (Signed)
-  Nice seeing you today!!  -Lab work today; will notify you once results are available.  -Remember your pneumonia and 4th COVID vaccines.  -Schedule follow up in 6 months.

## 2020-08-31 NOTE — Addendum Note (Signed)
Addended by: Westley Hummer B on: 08/31/2020 09:19 AM   Modules accepted: Orders

## 2020-08-31 NOTE — Addendum Note (Signed)
Addended by: Adah Salvage F on: 08/31/2020 08:04 AM   Modules accepted: Orders

## 2020-08-31 NOTE — Progress Notes (Signed)
Established Patient Office Visit     This visit occurred during the SARS-CoV-2 public health emergency.  Safety protocols were in place, including screening questions prior to the visit, additional usage of staff PPE, and extensive cleaning of exam room while observing appropriate contact time as indicated for disinfecting solutions.    CC/Reason for Visit: Annual preventive exam and subsequent Medicare wellness visit  HPI: Atiya Price is a 65 y.o. female who is coming in today for the above mentioned reasons. Past Medical History is significant for: Impaired glucose tolerance, hypertension, mixed hyperlipidemia with hypertriglyceridemia.  Vanessa Price is on evolocumab as well as fenofibrate and most recently was started on omega-3 fatty acids.  Vanessa Price is followed by Dr. Debara Pickett for her lipids.  Vanessa Price has had 3 COVID vaccines, Vanessa Price is due for her fourth vaccine and pneumonia vaccine.  Vanessa Price had a colonoscopy in 2019, Vanessa Price had a mammogram in August 2021, last Pap smear was in August 2020.  Vanessa Price is feeling well today.   Past Medical/Surgical History: Past Medical History:  Diagnosis Date   ANXIETY 06/04/2009   GERD 08/14/2008   occasional   Hx of adenomatous polyp of colon 03/11/2018   HYPERLIPIDEMIA 09/14/2006   HYPERTENSION 09/14/2006   Impaired glucose tolerance    MYOCARDIAL PERFUSION SCAN, WITH STRESS TEST, ABNORMAL 10/19/2008   PREMATURE VENTRICULAR CONTRACTIONS 08/14/2008    Past Surgical History:  Procedure Laterality Date   ABDOMINAL HYSTERECTOMY  1999   menorrhagia/partial   CESAREAN SECTION     2 times    Social History:  reports that Vanessa Price quit smoking about 27 years ago. Her smoking use included cigarettes. Vanessa Price has never used smokeless tobacco. Vanessa Price reports current alcohol use of about 1.0 standard drink of alcohol per week. Vanessa Price reports that Vanessa Price does not use drugs.  Allergies: Allergies  Allergen Reactions   Lisinopril     REACTION: cough   Risperidone     REACTION:  unsure of dose; pt describes reaction of bad cough    Family History:  Family History  Problem Relation Age of Onset   Arthritis Mother    Diabetes Mother    Heart disease Mother    Mental illness Father    Cancer Brother        Lung     Current Outpatient Medications:    Evolocumab (REPATHA SURECLICK) 361 MG/ML SOAJ, Inject 1 Dose into the skin every 14 (fourteen) days., Disp: 2 pen, Rfl: 11   fenofibrate (TRICOR) 145 MG tablet, TAKE 1 TABLET BY MOUTH  DAILY, Disp: 90 tablet, Rfl: 1   losartan-hydrochlorothiazide (HYZAAR) 50-12.5 MG tablet, Take 1 tablet by mouth daily., Disp: 90 tablet, Rfl: 1   Multiple Vitamins-Minerals (ONE DAILY WOMENS 50 PLUS) TABS, Take by mouth. Centrum Womens 50 plus MVI, Disp: , Rfl:    naproxen sodium (ALEVE) 220 MG tablet, Take 220 mg by mouth as needed. , Disp: , Rfl:    omega-3 acid ethyl esters (LOVAZA) 1 g capsule, Take 2 capsules (2 g total) by mouth 2 (two) times daily., Disp: 360 capsule, Rfl: 3  Review of Systems:  Constitutional: Denies fever, chills, diaphoresis, appetite change and fatigue.  HEENT: Denies photophobia, eye pain, redness, hearing loss, ear pain, congestion, sore throat, rhinorrhea, sneezing, mouth sores, trouble swallowing, neck pain, neck stiffness and tinnitus.   Respiratory: Denies SOB, DOE, cough, chest tightness,  and wheezing.   Cardiovascular: Denies chest pain, palpitations and leg swelling.  Gastrointestinal: Denies nausea, vomiting, abdominal pain, diarrhea, constipation,  blood in stool and abdominal distention.  Genitourinary: Denies dysuria, urgency, frequency, hematuria, flank pain and difficulty urinating.  Endocrine: Denies: hot or cold intolerance, sweats, changes in hair or nails, polyuria, polydipsia. Musculoskeletal: Denies myalgias, back pain, joint swelling, arthralgias and gait problem.  Skin: Denies pallor, rash and wound.  Neurological: Denies dizziness, seizures, syncope, weakness, light-headedness,  numbness and headaches.  Hematological: Denies adenopathy. Easy bruising, personal or family bleeding history  Psychiatric/Behavioral: Denies suicidal ideation, mood changes, confusion, nervousness, sleep disturbance and agitation    Physical Exam: Vitals:   08/31/20 0725  BP: 130/80  Pulse: 82  Temp: 98.1 F (36.7 C)  TempSrc: Oral  SpO2: 95%  Weight: 197 lb 12.8 oz (89.7 kg)  Height: 5' 4.5" (1.638 m)    Body mass index is 33.43 kg/m.   Constitutional: NAD, calm, comfortable, obese Eyes: PERRL, lids and conjunctivae normal, wears corrective lenses ENMT: Mucous membranes are moist. Posterior pharynx clear of any exudate or lesions. Normal dentition. Tympanic membrane is pearly white, no erythema or bulging. Neck: normal, supple, no masses, no thyromegaly Respiratory: clear to auscultation bilaterally, no wheezing, no crackles. Normal respiratory effort. No accessory muscle use.  Cardiovascular: Regular rate and rhythm, no murmurs / rubs / gallops. No extremity edema. 2+ pedal pulses. No carotid bruits.  Abdomen: no tenderness, no masses palpated. No hepatosplenomegaly. Bowel sounds positive.  Musculoskeletal: no clubbing / cyanosis. No joint deformity upper and lower extremities. Good ROM, no contractures. Normal muscle tone.  Skin: no rashes, lesions, ulcers. No induration Neurologic: CN 2-12 grossly intact. Sensation intact, DTR normal. Strength 5/5 in all 4.  Psychiatric: Normal judgment and insight. Alert and oriented x 3. Normal mood.    Subsequent Medicare wellness visit   1. Risk factors, based on past  M,S,F -cardiovascular disease risk factors include age, history of hyperlipidemia, history of hypertension   2.  Physical activities: Vanessa Price walks for about 40 minutes 4 days a week   3.  Depression/mood: Stable, not depressed   4.  Hearing: No perceived issues   5.  ADL's: Independent in all ADLs   6.  Fall risk: Low fall risk   7.  Home safety: No problems  identified   8.  Height weight, and visual acuity: height and weight as above, vision:  Vision Screening   Right eye Left eye Both eyes  Without correction     With correction 20/50 20/32 20/32      9.  Counseling: Advised continued efforts at lifestyle changes   10. Lab orders based on risk factors: Laboratory update will be reviewed   11. Referral : None today   12. Care plan: Follow-up with me in 6 months   13. Cognitive assessment: No cognitive impairment   14. Screening: Patient provided with a written and personalized 5-10 year screening schedule in the AVS. yes   15. Provider List Update: PCP, cardiologist Dr. Debara Pickett, ophthalmologist  16. Advance Directives: Full code   17. Opioids: Patient is not on any opioid prescriptions and has no risk factors for a substance use disorder.   [facility] Office Visit from 08/31/2020 in [facility]  PHQ-9 Total Score 3       Fall Risk  08/31/2020 10/26/2018 02/10/2018 10/02/2015  Falls in the past year? 0 0 0 No  Number falls in past yr: 0 0 0 -  Injury with Fall? 0 0 0 -     Impression and Plan:  Encounter for preventive health examination  -Vanessa Price  has routine eye and dental care. -Vanessa Price is due for her fourth COVID-vaccine and pneumonia vaccination which Vanessa Price declines today.  Otherwise immunizations are up-to-date. -Screening labs today. -Healthy lifestyle discussed in detail. -Vanessa Price had a colonoscopy in 2019 and is a 5-year callback. -Vanessa Price will be due for repeat mammogram in August 2022, states this has already been scheduled. -Vanessa Price had a Pap smear in 2020 and will be due for repeat in 2023, Vanessa Price follows with GYN. -Vanessa Price is due for repeat DEXA this year.  Essential hypertension  - Plan: CBC with Differential/Platelet, Comprehensive metabolic panel -Well-controlled on losartan/hydrochlorothiazide.  Impaired glucose tolerance  - Plan: Hemoglobin A1c  Dyslipidemia  - Plan: Lipid panel  Constipation,  unspecified constipation type -Improved with occasional use of MiraLAX.  Vitamin D deficiency  - Plan: VITAMIN D 25 Hydroxy (Vit-D Deficiency, Fractures)  Gastroesophageal reflux disease without esophagitis -Well-controlled, not on daily PPI therapy.  Obesity (BMI 30.0-34.9) -Discussed healthy lifestyle, including increased physical activity and better food choices to promote weight loss.     Patient Instructions  -Nice seeing you today!!  -Lab work today; will notify you once results are available.  -Remember your pneumonia and 4th COVID vaccines.  -Schedule follow up in 6 months.     [name], MD [facility]

## 2020-09-02 ENCOUNTER — Other Ambulatory Visit: Payer: Self-pay | Admitting: Internal Medicine

## 2020-09-05 NOTE — Telephone Encounter (Signed)
Attempted to call Daisy with [facility] regarding lovaza

## 2020-10-09 ENCOUNTER — Other Ambulatory Visit: Payer: Medicare Other

## 2020-11-08 ENCOUNTER — Telehealth: Payer: Self-pay | Admitting: Pharmacist

## 2020-11-08 NOTE — Chronic Care Management (AMB) (Signed)
    Chronic Care Management Pharmacy Assistant   Name: Vanessa Price  MRN: [id number] DOB: 1955/07/21  11/08/20-  Called patient to remind of appointment with [name]) on (11/09/20 at 1pm via telephone.)  Patient aware of appointment date, time, and type of appointment (either telephone or in person). Patient aware to have/bring all medications, supplements, blood pressure and/or blood sugar logs to visit.  Questions: Have you had any recent office visit or specialist visit outside of [place]? No.   Are there any concerns you would like to discuss during your office visit? Patient wants to know if she would be able to get patient assistance for Repatha on top of the grant she receives. Patient fell recently and says her back has been hurting a little.   Are you having any problems obtaining your medications? No issues at this time.   If patient has any PAP medications ask if they are having any problems getting their PAP medication or refill? Patient receives patient assistance for Repatha.   Care Gaps:  AWV - message sent to [name] CMA to schedule Flu vaccine - due Mammogram - overdue since 11/02/20  Star Rating Drug:  Losartan - hydrochlorothiazide 50-12.'5mg'$  - last filled on 09/17/20 90DS at [facility]   Any gaps in medications fill history? No.  [place]  Clinical Pharmacist Assistant [phone]

## 2020-11-09 ENCOUNTER — Ambulatory Visit (INDEPENDENT_AMBULATORY_CARE_PROVIDER_SITE_OTHER): Payer: Medicare Other | Admitting: Pharmacist

## 2020-11-09 DIAGNOSIS — I10 Essential (primary) hypertension: Secondary | ICD-10-CM

## 2020-11-09 DIAGNOSIS — E785 Hyperlipidemia, unspecified: Secondary | ICD-10-CM

## 2020-11-09 NOTE — Progress Notes (Signed)
Chronic Care Management Pharmacy Note  11/09/2020 Name:  Vanessa Price MRN:  [id number] DOB:  1955-09-18  Summary: TGs at goal < 150 Pt reports side effects with fenofibrate and inquired about switching medications  Recommendations/Changes made from today's visit: -Recommended discontinuing fenofibrate and switching Lovaza to Vascepa for triglyceride lowering -Recommended weekly monitoring of BP at home  Plan: Mail Repatha patient assistance application Follow up in 6 months  Subjective: Vanessa Price is an 65 y.o. year old female who is a primary patient of [name], [name], MD.  The CCM team was consulted for assistance with disease management and care coordination needs.    Engaged with patient by telephone for follow up visit in response to provider referral for pharmacy case management and/or care coordination services.   Consent to Services:  The patient was given information about Chronic Care Management services, agreed to services, and gave verbal consent prior to initiation of services.  Please see initial visit note for detailed documentation.   Patient Care Team: [name], [name], MD as PCP - General (Internal Medicine) Viona Gilmore, [facility] as Pharmacist (Pharmacist)  Recent office visits: 08/31/20 Domingo Mend, MD: Patient presented for annual exam. Triglycerides significantly improved. Ordered DEXA.  08/06/20 Grier Mitts, MD: Patient presented for cough. Prescribed benzonatate 100 mg BID PRN and Flonase nasal spray.  05/31/20 Domingo Mend, MD: Patient presented for constipation follow up. Recommended miralax daily and colace twice daily. Patient believes it is from [place].   Recent consult visits: 08/01/20 [name], MD (cardiology): Patient presented for HLD follow up. D/c'd Nexletol and started Lovaza 2 capsules BID.  01/12/20 [name], MD (ortho): Patient presented with left knee pain. Prescribed  prednisone and ordered x-rays of knee and spine.    Hospital visits: None in previous 6 months  Objective:  Lab Results  Component Value Date   CREATININE 0.94 08/31/2020   BUN 20 08/31/2020   GFR 63.09 08/31/2020   GFRNONAA 111.49 05/28/2009   GFRAA 96 01/04/2008   NA 144 08/31/2020   K 3.9 08/31/2020   CALCIUM 9.8 08/31/2020   CO2 27 08/31/2020   GLUCOSE 100 (H) 08/31/2020    Lab Results  Component Value Date/Time   HGBA1C 6.2 08/31/2020 08:05 AM   HGBA1C 5.9 (A) 05/31/2020 08:36 AM   HGBA1C 6.2 (A) 06/23/2019 07:44 AM   HGBA1C 6.3 10/26/2018 08:03 AM   GFR 63.09 08/31/2020 08:05 AM   GFR 64.85 05/31/2020 08:54 AM    Last diabetic Eye exam: No results found for: HMDIABEYEEXA  Last diabetic Foot exam: No results found for: HMDIABFOOTEX   Lab Results  Component Value Date   CHOL 142 08/31/2020   HDL 44.40 08/31/2020   LDLCALC 69 08/31/2020   LDLDIRECT 41.0 05/31/2020   TRIG 141.0 08/31/2020   CHOLHDL 3 08/31/2020    Hepatic Function Latest Ref Rng & Units 08/31/2020 05/31/2020 10/07/2019  Total Protein 6.0 - 8.3 g/dL 6.8 7.0 7.4  Albumin 3.5 - 5.2 g/dL 4.5 4.5 4.8  AST 0 - 37 U/L 18 30 55(H)  ALT 0 - 35 U/L 20 36(H) 60(H)  Alk Phosphatase 39 - 117 U/L 37(L) 44 55  Total Bilirubin 0.2 - 1.2 mg/dL 0.4 0.4 0.3  Bilirubin, Direct 0.00 - 0.40 mg/dL - - 0.13    Lab Results  Component Value Date/Time   TSH 2.89 08/31/2020 08:05 AM   TSH 1.65 10/26/2018 08:03 AM    CBC Latest Ref Rng & Units 08/31/2020 10/26/2018 10/13/2017  WBC 4.0 - 10.5 K/uL 4.5 4.8 4.2  Hemoglobin 12.0 - 15.0 g/dL 13.5 13.6 14.2  Hematocrit 36.0 - 46.0 % 40.4 41.8 42.4  Platelets 150.0 - 400.0 K/uL 180.0 230.0 224.0    Lab Results  Component Value Date/Time   VD25OH 38.47 08/31/2020 08:05 AM   VD25OH 37.47 02/10/2019 07:38 AM    Clinical ASCVD: No  The 10-year ASCVD risk score Mikey Bussing DC Jr., et al., 2013) is: 7.9%   Values used to calculate the score:     Age: 33 years     Sex:  Female     Is Non-Hispanic African American: Yes     Diabetic: No     Tobacco smoker: No     Systolic Blood Pressure: 893 mmHg     Is BP treated: Yes     HDL Cholesterol: 44.4 mg/dL     Total Cholesterol: 142 mg/dL    Depression screen [facility] 2/9 08/31/2020 10/26/2018 02/10/2018  Decreased Interest 1 0 0  Down, Depressed, Hopeless 1 0 1  PHQ - 2 Score 2 0 1  Altered sleeping 1 1 -  Tired, decreased energy 0 0 -  Change in appetite 0 0 -  Feeling bad or failure about yourself  0 0 -  Trouble concentrating 0 0 -  Moving slowly or fidgety/restless 0 0 -  Suicidal thoughts 0 0 -  PHQ-9 Score 3 1 -  Difficult doing work/chores Not difficult at all Not difficult at all -      Social History   Tobacco Use  Smoking Status Former   Types: Cigarettes   Quit date: 03/24/1993   Years since quitting: 27.6  Smokeless Tobacco Never   BP Readings from Last 3 Encounters:  08/31/20 130/80  08/06/20 138/88  08/01/20 126/68   Pulse Readings from Last 3 Encounters:  08/31/20 82  08/06/20 83  08/01/20 98   Wt Readings from Last 3 Encounters:  08/31/20 197 lb 12.8 oz (89.7 kg)  08/06/20 197 lb 12.8 oz (89.7 kg)  08/01/20 201 lb 9.6 oz (91.4 kg)   BMI Readings from Last 3 Encounters:  08/31/20 33.43 kg/m  08/06/20 33.95 kg/m  08/01/20 34.60 kg/m    Assessment/Interventions: Review of patient past medical history, allergies, medications, health status, including review of consultants reports, laboratory and other test data, was performed as part of comprehensive evaluation and provision of chronic care management services.   SDOH:  (Social Determinants of Health) assessments and interventions performed: No  SDOH Screenings   Alcohol Screen: Not on file  Depression (PHQ2-9): Low Risk    PHQ-2 Score: 3  Financial Resource Strain: Not on file  Food Insecurity: Not on file  Housing: Not on file  Physical Activity: Not on file  Social Connections: Not on file  Stress: Not on file   Tobacco Use: Medium Risk   Smoking Tobacco Use: Former   Smokeless Tobacco Use: Never  Transportation Needs: Not on file    [place]  Allergies  Allergen Reactions   Lisinopril     REACTION: cough   Risperidone     REACTION: unsure of dose; pt describes reaction of bad cough    Medications Reviewed Today     Reviewed by Lamarr Lulas, CMA (Certified Medical Assistant) on 08/31/20 at [place] Status: <None>   Medication Order Taking? Sig Documenting Provider Last Dose Status Informant  Evolocumab (REPATHA SURECLICK) 734 MG/ML SOAJ [id number] Yes Inject 1 Dose into the skin every  14 (fourteen) days. Pixie Casino, MD Taking Active   fenofibrate (TRICOR) 145 MG tablet [id number] Yes TAKE 1 TABLET BY MOUTH  DAILY [name], [name], MD Taking Active   losartan-hydrochlorothiazide [facility]) 50-12.5 MG tablet [id number] Yes Take 1 tablet by mouth daily. [name], [name], MD Taking Active   Multiple Vitamins-Minerals (ONE DAILY WOMENS 50 PLUS) TABS [id number] Yes Take by mouth. Centrum Womens 50 plus MVI [provider] Taking Active   naproxen sodium (ALEVE) 220 MG tablet [id number] Yes Take 220 mg by mouth as needed.  [provider] Taking Active   omega-3 acid ethyl esters (LOVAZA) 1 g capsule [id number] Yes Take 2 capsules (2 g total) by mouth 2 (two) times daily. Pixie Casino, MD Taking Active             Patient Active Problem List   Diagnosis Date Noted   Vitamin D deficiency 10/26/2018   Hx of adenomatous polyp of colon 03/11/2018   Constipation 02/10/2018   Impaired glucose tolerance 10/02/2015   ANXIETY 06/04/2009   MYOCARDIAL PERFUSION SCAN, WITH STRESS TEST, ABNORMAL 10/19/2008   PREMATURE VENTRICULAR CONTRACTIONS 08/14/2008   GERD 08/14/2008   Goiter 01/04/2008   Dyslipidemia 09/14/2006   Essential hypertension 09/14/2006    Immunization History  Administered Date(s) Administered   Influenza Split  12/24/2010, 12/17/2011   Influenza,inj,Quad PF,6+ Mos 01/07/2013   PFIZER(Purple Top)SARS-COV-2 Vaccination 07/19/2019, 08/09/2019, 02/21/2020   Tdap 08/17/2014   Zoster Recombinat (Shingrix) 10/26/2018, 02/10/2019   Patient reported that she is doing well right now. She reported that her Repatha is more expensive when she is in the donut hole but is not right now. She did inquire about patient assistance for Repatha. Patient has noticed cramping with use of fenofibrate and inquired about stopping. Discussed how this would likely impact her triglycerides but could consider switching fish oil to Vascepa for further benefit. Reached out to cardiologist.  Conditions to be addressed/monitored:  Hypertension, Hyperlipidemia and constipation, pain, pre-diabetes  Conditions to be addressed the visit: Hyperlipidemia, hypertension  Care Plan : CCM Pharmacy Care Plan  Updates made by Viona Gilmore, [place] since 11/09/2020 12:00 AM     Problem: Problem: Hypertension, Hyperlipidemia and constipation, pain, pre-diabetes      Long-Range Goal: Patient-Specific Goal   Start Date: 06/14/2020  Expected End Date: 06/14/2021  Recent Progress: On track  Priority: High  Note:   Current Barriers:  Unable to independently monitor therapeutic efficacy  Pharmacist Clinical Goal(s):  Patient will achieve control of triglycerides as evidenced by next lipid panel  through collaboration with PharmD and provider.   Interventions: 1:1 collaboration with [name], [name], MD regarding development and update of comprehensive plan of care as evidenced by provider attestation and co-signature Inter-disciplinary care team collaboration (see longitudinal plan of care) Comprehensive medication review performed; medication list updated in electronic medical record  Hypertension (BP goal <140/90) -Not ideally controlled -Current treatment: Losartan-hydrochlorothiazide 50-25mg , 1 tablet once  daily -Medications previously tried: olmesartan (cost)  -Current home readings: not checking regularly at home; has an arm cuff -Current dietary habits: did not discuss -Current exercise habits: walking 2 miles a day 4 days a week -Denies hypotensive/hypertensive symptoms -Educated on Exercise goal of 150 minutes per week; Importance of home blood pressure monitoring; -Counseled to monitor BP at home weekly, document, and provide log at future appointments -Counseled on diet and exercise extensively Recommended to continue current medication  Hyperlipidemia: (LDL goal < 100) -Controlled -Current treatment: Fenofibrate  134m, 1 tablet once daily  Repatha 140 mg inject every 14 days Lovaza 1 g 4 capsules once daily -Medications previously tried: atorvastatin, pravastatin, ezetimibe, Nexletol (no longer needed)  -Current dietary patterns: limiting fatty foods; does admit to overeating often -Current exercise habits: walking 4 days a week -Educated on Cholesterol goals;  Importance of limiting foods high in cholesterol; Exercise goal of 150 minutes per week; -Recommended to continue current medication Recommended discontinuing fenofibrate and switching Lovaza to Vascepa for triglyceride lowering.  Pre-diabetes (A1c goal <6.5%) -Controlled -Current medications: No medications -Medications previously tried: none  -Current home glucose readings fasting glucose: n/a post prandial glucose: n/a -Denies hypoglycemic/hyperglycemic symptoms -Current meal patterns:  breakfast: did not discuss  lunch: did not discuss   dinner: did not discuss  snacks: did not discuss  drinks: did not discuss  -Current exercise: walking 4 days a week -Educated on A1c and blood sugar goals; Exercise goal of 150 minutes per week; Carbohydrate counting and/or plate method -Counseled to check feet daily and get yearly eye exams -Counseled on diet and exercise extensively Congratulated patient on improved  A1c  Constipation (Goal: regular bowel movements) -Controlled -Current treatment  Miralax as needed - primarily using this Colace as needed Psyllium powder, 2 tablespoons daily as needed -Medications previously tried: n/a  -Counseled on increasing water intake and fiber intake  Pain (Goal: minimize symptoms) -Controlled -Current treatment  Naproxen 2220m 2 tablets as needed -Medications previously tried: n/a  -Recommended to continue current medication  Vitamin D deficiency (Goal: vitamin D 30-100) -Controlled -Current treatment  Vitamin D3, 5000 units, 1 tablet once daily  -Medications previously tried: none  -Recommended repeat vitamin D level  Health Maintenance -Vaccine gaps: Prevnar, influenza - patient declines both vaccines at this time -Current therapy:  Multivitamin 1 tablet daily Probiotics daily -Educated on Cost vs benefit of each product must be carefully weighed by individual consumer -Patient is satisfied with current therapy and denies issues -Recommended to continue current medication  Patient Goals/Self-Care Activities Patient will:  - check blood pressure weekly, document, and provide at future appointments target a minimum of 150 minutes of moderate intensity exercise weekly engage in dietary modifications by increasing fiber intake and avoid overeating  Follow Up Plan: Telephone follow up appointment with care management team member scheduled for: 6 months      Medication Assistance:  Repatha and [facility]letol [facility].  Enrollment ends when grant is completed  Compliance/Adherence/Medication fill history: Care Gaps: Influenza, mammogram  Star-Rating Drugs: Losartan - hydrochlorothiazide 50-12.60m53m [facility]17/20 [facility] [place] [id number]: [phone]: [address] [place] [id number]: [phone]: [phone] pill box? No - takes all medications in the morning Pt endorses 100% compliance  We discussed: Current pharmacy is preferred with insurance plan and patient is satisfied with pharmacy services Patient decided to: Continue current medication management strategy  Care Plan and Follow Up Patient Decision:  Patient agrees to Care Plan and Follow-up.  Plan: Telephone follow up appointment with care management team member scheduled for:  5 months  MadJeni SallesharmD BCANewellarmacist [phone]

## 2020-11-09 NOTE — Patient Instructions (Signed)
Hi Mikelle,  It was great speaking with you today! Below is a summary of some of the topics we discussed.   I will reach out once I hear back from cardiology!  Please reach out to me if you have any questions or need anything before our follow up!  Best, Maddie  [name], PharmD, [facility] [place]   Visit Information   Goals Addressed   None    Patient Care Plan: CCM Pharmacy Care Plan     Problem Identified: Problem: Hypertension, Hyperlipidemia and constipation, pain, pre-diabetes      Long-Range Goal: Patient-Specific Goal   Start Date: 06/14/2020  Expected End Date: 06/14/2021  Recent Progress: On track  Priority: High  Note:   Current Barriers:  Unable to independently monitor therapeutic efficacy  Pharmacist Clinical Goal(s):  Patient will achieve control of triglycerides as evidenced by next lipid panel  through collaboration with PharmD and provider.   Interventions: 1:1 collaboration with [name], [name], MD regarding development and update of comprehensive plan of care as evidenced by provider attestation and co-signature Inter-disciplinary care team collaboration (see longitudinal plan of care) Comprehensive medication review performed; medication list updated in electronic medical record  Hypertension (BP goal <140/90) -Not ideally controlled -Current treatment: Losartan-hydrochlorothiazide 50-'25mg'$ , 1 tablet once daily -Medications previously tried: olmesartan (cost)  -Current home readings: not checking regularly at home; has an arm cuff -Current dietary habits: did not discuss -Current exercise habits: walking 2 miles a day 4 days a week -Denies hypotensive/hypertensive symptoms -Educated on Exercise goal of 150 minutes per week; Importance of home blood pressure monitoring; -Counseled to monitor BP at home weekly, document, and provide log at future appointments -Counseled on  diet and exercise extensively Recommended to continue current medication  Hyperlipidemia: (LDL goal < 100) -Controlled -Current treatment: Fenofibrate '145mg'$ , 1 tablet once daily  Repatha 140 mg inject every 14 days Lovaza 1 g 4 capsules once daily -Medications previously tried: atorvastatin, pravastatin, ezetimibe, Nexletol (no longer needed)  -Current dietary patterns: limiting fatty foods; does admit to overeating often -Current exercise habits: walking 4 days a week -Educated on Cholesterol goals;  Importance of limiting foods high in cholesterol; Exercise goal of 150 minutes per week; -Recommended to continue current medication Recommended discontinuing fenofibrate and switching Lovaza to Vascepa for triglyceride lowering.  Pre-diabetes (A1c goal <6.5%) -Controlled -Current medications: No medications -Medications previously tried: none  -Current home glucose readings fasting glucose: n/a post prandial glucose: n/a -Denies hypoglycemic/hyperglycemic symptoms -Current meal patterns:  breakfast: did not discuss  lunch: did not discuss   dinner: did not discuss  snacks: did not discuss  drinks: did not discuss  -Current exercise: walking 4 days a week -Educated on A1c and blood sugar goals; Exercise goal of 150 minutes per week; Carbohydrate counting and/or plate method -Counseled to check feet daily and get yearly eye exams -Counseled on diet and exercise extensively Congratulated patient on improved A1c  Constipation (Goal: regular bowel movements) -Controlled -Current treatment  Miralax as needed - primarily using this Colace as needed Psyllium powder, 2 tablespoons daily as needed -Medications previously tried: n/a  -Counseled on increasing water intake and fiber intake  Pain (Goal: minimize symptoms) -Controlled -Current treatment  Naproxen '220mg'$ , 2 tablets as needed -Medications previously tried: n/a  -Recommended to continue current medication  Vitamin  D deficiency (Goal: vitamin D 30-100) -Controlled -Current treatment  Vitamin D3, 5000 units, 1 tablet once daily  -Medications previously tried: none  -  Recommended repeat vitamin D level  Health Maintenance -Vaccine gaps: Prevnar, influenza - patient declines both vaccines at this time -Current therapy:  Multivitamin 1 tablet daily Probiotics daily -Educated on Cost vs benefit of each product must be carefully weighed by individual consumer -Patient is satisfied with current therapy and denies issues -Recommended to continue current medication  Patient Goals/Self-Care Activities Patient will:  - check blood pressure weekly, document, and provide at future appointments target a minimum of 150 minutes of moderate intensity exercise weekly engage in dietary modifications by increasing fiber intake and avoid overeating  Follow Up Plan: Telephone follow up appointment with care management team member scheduled for: 6 months       Patient verbalizes understanding of instructions provided today and agrees to view in [place].  The pharmacy team will reach out to the patient again over the next 30 days.   Viona Gilmore, [facility]

## 2020-11-13 ENCOUNTER — Telehealth: Payer: Self-pay | Admitting: Internal Medicine

## 2020-11-13 ENCOUNTER — Encounter: Payer: Self-pay | Admitting: Internal Medicine

## 2020-11-13 DIAGNOSIS — G479 Sleep disorder, unspecified: Secondary | ICD-10-CM

## 2020-11-13 NOTE — Telephone Encounter (Signed)
-----   Message from Pixie Casino, MD sent at 11/09/2020  1:59 PM EDT ----- Regarding: RE: Cholesterol therapy Thanks for reaching out - she was already tried on both - she had GI intolerance to Lovaza most recently - I would be happy to see her back in follow-up since I am managing her to discuss options. She could try holding the fenofibrate and see how she feels or I can reduce the dose to 45 mg and see if that is better tolerated.  We'll contact her with those options since we weren't told she was having potential side effects.  Dr. Debara Pickett  ----- Message ----- From: Viona Gilmore, [facility] Sent: 11/09/2020   1:29 PM EDT To: Pixie Casino, MD Subject: Cholesterol therapy                            Hi,  I am a pharmacist working in Ms. Jackson-Lorick's PCP's office and had the pleasure of speaking with her today. She reported that she is having some cramping with fenofibrate which has been happening on and off with her throughout the entire duration of therapy and wanted to see if she could do without it. If we do stop the fenofibrate, what do you think about switching her Lovaza to Vascepa to see if this would help with her triglycerides more? The other benefit with this switch is that it would likely impact the LDL less as well which might be offset with stopping the fenofibrate.  Let me know what you think and I would be happy to communicate any changes to her!  Best, Maddie  [name], PharmD, [place] Pharmacist [place] [facility] [place] [phone]

## 2020-11-13 NOTE — Telephone Encounter (Signed)
MyChart message sent to patient for follow up on this concern

## 2020-11-22 ENCOUNTER — Telehealth: Payer: Self-pay | Admitting: Pharmacist

## 2020-11-22 NOTE — Chronic Care Management (AMB) (Signed)
    Chronic Care Management Pharmacy Assistant   Name: Vanessa Price  MRN: [id number] DOB: [birth year]  Completed patient assistance application for Repatha per [name] the clinical pharmacist request. Per [name] the patient is aware of application and will be looking out for the mail to complete her part and give to prescribing physicians office to complete. Will mail out application on [id number].   Medications: Outpatient Encounter Medications as of 11/22/2020  Medication Sig   Evolocumab (REPATHA SURECLICK) [id number] MG/ML SOAJ INJECT 1 DOSE INTO THE SKIN EVERY 14 DAYS   fenofibrate (TRICOR) 145 MG tablet TAKE 1 TABLET BY MOUTH  DAILY   losartan-hydrochlorothiazide (HYZAAR) 50-12.5 MG tablet Take 1 tablet by mouth daily.   Multiple Vitamins-Minerals (ONE DAILY WOMENS 50 PLUS) TABS Take by mouth. Centrum Womens 50 plus MVI   naproxen sodium (ALEVE) 220 MG tablet Take 220 mg by mouth as needed.    omega-3 acid ethyl esters (LOVAZA) 1 g capsule Take 2 capsules (2 g total) by mouth 2 (two) times daily.   No facility-administered encounter medications on file as of 11/22/2020.    Care Gaps:  AWV - message sent to [name] CMA to schedule. Flu vaccine - due/ Mammogram - overdue since 11/02/20  Star Rating Drugs:  Losartan - hydrochlorothiazide 50-12.'5mg'$  - last filled on 09/17/20 90DS at [place] [phone]

## 2020-11-29 NOTE — Telephone Encounter (Signed)
Attempted to call patient with recommendations from MD, as MyChart message has not been viewed. Message received is "call cannot be completed at this time"

## 2020-12-17 ENCOUNTER — Other Ambulatory Visit: Payer: Self-pay

## 2020-12-17 ENCOUNTER — Emergency Department (HOSPITAL_COMMUNITY): Payer: Medicare Other

## 2020-12-17 ENCOUNTER — Emergency Department (HOSPITAL_COMMUNITY)
Admission: EM | Admit: 2020-12-17 | Discharge: 2020-12-17 | Disposition: A | Discharge status: Home / Self Care | Payer: Medicare Other | Attending: Emergency Medicine | Admitting: Emergency Medicine

## 2020-12-17 DIAGNOSIS — Z79899 Other long term (current) drug therapy: Secondary | ICD-10-CM | POA: Insufficient documentation

## 2020-12-17 DIAGNOSIS — K7689 Other specified diseases of liver: Secondary | ICD-10-CM | POA: Diagnosis not present

## 2020-12-17 DIAGNOSIS — I10 Essential (primary) hypertension: Secondary | ICD-10-CM | POA: Diagnosis not present

## 2020-12-17 DIAGNOSIS — R1032 Left lower quadrant pain: Secondary | ICD-10-CM

## 2020-12-17 DIAGNOSIS — K8020 Calculus of gallbladder without cholecystitis without obstruction: Secondary | ICD-10-CM | POA: Diagnosis not present

## 2020-12-17 DIAGNOSIS — R140 Abdominal distension (gaseous): Secondary | ICD-10-CM | POA: Diagnosis not present

## 2020-12-17 DIAGNOSIS — Z87891 Personal history of nicotine dependence: Secondary | ICD-10-CM | POA: Insufficient documentation

## 2020-12-17 DIAGNOSIS — I878 Other specified disorders of veins: Secondary | ICD-10-CM | POA: Diagnosis not present

## 2020-12-17 DIAGNOSIS — K219 Gastro-esophageal reflux disease without esophagitis: Secondary | ICD-10-CM | POA: Diagnosis not present

## 2020-12-17 LAB — CBC WITH DIFFERENTIAL/PLATELET
Abs Immature Granulocytes: 0.04 10*3/uL (ref 0.00–0.07)
Basophils Absolute: 0.1 10*3/uL (ref 0.0–0.1)
Basophils Relative: 1 %
Eosinophils Absolute: 0.1 10*3/uL (ref 0.0–0.5)
Eosinophils Relative: 2 %
HCT: 44.6 % (ref 36.0–46.0)
Hemoglobin: 14.3 g/dL (ref 12.0–15.0)
Immature Granulocytes: 1 %
Lymphocytes Relative: 29 %
Lymphs Abs: 1.5 10*3/uL (ref 0.7–4.0)
MCH: 27.9 pg (ref 26.0–34.0)
MCHC: 32.1 g/dL (ref 30.0–36.0)
MCV: 87.1 fL (ref 80.0–100.0)
Monocytes Absolute: 0.3 10*3/uL (ref 0.1–1.0)
Monocytes Relative: 6 %
Neutro Abs: 3.1 10*3/uL (ref 1.7–7.7)
Neutrophils Relative %: 61 %
Platelets: 242 10*3/uL (ref 150–400)
RBC: 5.12 MIL/uL — ABNORMAL HIGH (ref 3.87–5.11)
RDW: 13.6 % (ref 11.5–15.5)
WBC: 5.1 10*3/uL (ref 4.0–10.5)
nRBC: 0 % (ref 0.0–0.2)

## 2020-12-17 LAB — URINALYSIS, ROUTINE W REFLEX MICROSCOPIC
Bilirubin Urine: NEGATIVE
Glucose, UA: NEGATIVE mg/dL
Hgb urine dipstick: NEGATIVE
Ketones, ur: NEGATIVE mg/dL
Leukocytes,Ua: NEGATIVE
Nitrite: NEGATIVE
Protein, ur: NEGATIVE mg/dL
Specific Gravity, Urine: 1.015 (ref 1.005–1.030)
pH: 6 (ref 5.0–8.0)

## 2020-12-17 LAB — COMPREHENSIVE METABOLIC PANEL
ALT: 26 U/L (ref 0–44)
AST: 25 U/L (ref 15–41)
Albumin: 4.4 g/dL (ref 3.5–5.0)
Alkaline Phosphatase: 49 U/L (ref 38–126)
Anion gap: 8 (ref 5–15)
BUN: 13 mg/dL (ref 8–23)
CO2: 23 mmol/L (ref 22–32)
Calcium: 9.4 mg/dL (ref 8.9–10.3)
Chloride: 106 mmol/L (ref 98–111)
Creatinine, Ser: 0.81 mg/dL (ref 0.44–1.00)
GFR, Estimated: >60 mL/min (ref >60)
Glucose, Bld: 102 mg/dL — ABNORMAL HIGH (ref 70–99)
Potassium: 4 mmol/L (ref 3.5–5.1)
Sodium: 137 mmol/L (ref 135–145)
Total Bilirubin: 0.5 mg/dL (ref 0.3–1.2)
Total Protein: 7.4 g/dL (ref 6.5–8.1)

## 2020-12-17 LAB — LIPASE, BLOOD: Lipase: 27 U/L (ref 11–51)

## 2020-12-17 MED ORDER — SENNOSIDES-DOCUSATE SODIUM 8.6-50 MG PO TABS: 1.0000 | ORAL_TABLET | Freq: Every day | ORAL | 0 refills | Status: AC

## 2020-12-17 MED ORDER — IOHEXOL 350 MG/ML SOLN
75.0000 mL | Freq: Once | INTRAVENOUS | Status: AC | PRN
  Administered 2020-12-17: 75 mL via INTRAVENOUS

## 2020-12-17 MED ORDER — MORPHINE SULFATE (PF) 4 MG/ML IV SOLN
4.0000 mg | Freq: Once | INTRAVENOUS | Status: AC
  Administered 2020-12-17: 4 mg via INTRAVENOUS
  Filled 2020-12-17: qty 1

## 2020-12-17 MED ORDER — DICYCLOMINE HCL 20 MG PO TABS
20.0000 mg | ORAL_TABLET | Freq: Two times a day (BID) | ORAL | 0 refills | Status: DC
Start: 2020-12-17 — End: 2021-01-17

## 2020-12-17 NOTE — ED Triage Notes (Signed)
Pt reports LLQ pain x 4 days. Denies n/v/d. Reports excessive burping and passing gas. Last BM today.

## 2020-12-17 NOTE — ED Provider Notes (Signed)
Emergency Medicine Provider Triage Evaluation Note  Vanessa Price , a 65 y.o. female  was evaluated in triage.  Pt complains of abdominal pain in the left lower part of her abdomen for the past 3 to 4 days denies any nausea vomiting diarrhea but does state that she has been burping and passing excessive gas and feeling somewhat upset her stomach she states she has been pooping normal for her apart from she noticed that after she took Pepto-Bismol for her abdominal discomfort she had some black stool. Denies any chest pain shortness breath lightheadedness or dizziness no fevers or chills cough congestion no history of diverticulitis or diverticulosis that she knows of.  No blood in her stool.  Review of Systems  Positive: Abdominal pain Negative: Fever  Physical Exam  BP (!) 177/95 (BP Location: Right Arm)   Pulse 85   Temp 98.8 F (37.1 C)   Resp 17   SpO2 100%  Gen:   Awake, no distress   Resp:  Normal effort  MSK:   Moves extremities without difficulty  Other:  Abd soft non-tender to palpation  Medical Decision Making  Medically screening exam initiated at 11:28 AM.  Appropriate orders placed.  Vanessa Price was informed that the remainder of the evaluation will be completed by another provider, this initial triage assessment does not replace that evaluation, and the importance of remaining in the ED until their evaluation is complete.  Patient is well-appearing has no abdominal tenderness seems to have had 4 days of ongoing discomfort in the left lower abdomen is not tender in this area took some Pepto-Bismol and had dark stool which she found alarming.  Physical exam is reassuring no CVA tenderness no urinary symptoms that she describes.   Vanessa Price [place], [place] 12/17/20 1130    Lucrezia Starch, MD 12/18/20 1414

## 2020-12-17 NOTE — ED Notes (Signed)
Pt verbalizes understanding of discharge instructions. Opportunity for questions and answers were provided. Pt discharged from the ED.   ?

## 2020-12-17 NOTE — ED Provider Notes (Signed)
[facility] EMERGENCY DEPARTMENT Provider Note   CSN: [id number] Arrival date & time: 12/17/20  7209     History Chief Complaint  Patient presents with   Abdominal Pain    Vanessa Price is a 65 y.o. female.  The history is provided by the patient.  Abdominal Pain Pain location:  LLQ Pain quality: aching   Pain radiates to:  Does not radiate Pain severity:  Moderate Onset quality:  Gradual Duration:  4 days Timing:  Intermittent Progression:  Waxing and waning Chronicity:  New Associated symptoms: belching   Associated symptoms: no chills, no cough, no diarrhea, no dysuria, no fatigue, no fever, no flatus, no hematuria, no nausea, no shortness of breath and no vomiting       Past Medical History:  Diagnosis Date   ANXIETY 06/04/2009   GERD 08/14/2008   occasional   Hx of adenomatous polyp of colon 03/11/2018   HYPERLIPIDEMIA 09/14/2006   HYPERTENSION 09/14/2006   Impaired glucose tolerance    MYOCARDIAL PERFUSION SCAN, WITH STRESS TEST, ABNORMAL 10/19/2008   PREMATURE VENTRICULAR CONTRACTIONS 08/14/2008    Patient Active Problem List   Diagnosis Date Noted   Vitamin D deficiency 10/26/2018   Hx of adenomatous polyp of colon 03/11/2018   Constipation 02/10/2018   Impaired glucose tolerance 10/02/2015   ANXIETY 06/04/2009   MYOCARDIAL PERFUSION SCAN, WITH STRESS TEST, ABNORMAL 10/19/2008   PREMATURE VENTRICULAR CONTRACTIONS 08/14/2008   GERD 08/14/2008   Goiter 01/04/2008   Dyslipidemia 09/14/2006   Essential hypertension 09/14/2006    Past Surgical History:  Procedure Laterality Date   ABDOMINAL HYSTERECTOMY  1999   menorrhagia/partial   CESAREAN SECTION     2 times     OB History     Gravida  5   Para  2   Term  2   Preterm      AB  3   Living  2      SAB      IAB      Ectopic      Multiple      Live Births              Family History  Problem Relation Age of Onset   Arthritis Mother     Diabetes Mother    Heart disease Mother    Mental illness Father    Cancer Brother        Lung    Social History   Tobacco Use   Smoking status: Former    Types: Cigarettes    Quit date: 03/24/1993    Years since quitting: 27.7   Smokeless tobacco: Never  Vaping Use   Vaping Use: Never used  Substance Use Topics   Alcohol use: Yes    Alcohol/week: 1.0 standard drink    Types: 1 Standard drinks or equivalent per week    Comment: social   Drug use: No    Home Medications Prior to Admission medications   Medication Sig Start Date End Date Taking? Authorizing Provider  dicyclomine (BENTYL) 20 MG tablet Take 1 tablet (20 mg total) by mouth 2 (two) times daily. 12/17/20  Yes Regan Lemming, MD  senna-docusate (SENOKOT-S) 8.6-50 MG tablet Take 1 tablet by mouth daily. 12/17/20 01/16/21 Yes Regan Lemming, MD  Evolocumab (REPATHA SURECLICK) 470 MG/ML SOAJ INJECT 1 DOSE INTO THE SKIN EVERY 14 DAYS 09/03/20   Pixie Casino, MD  fenofibrate (TRICOR) 145 MG tablet TAKE 1 TABLET BY MOUTH  DAILY 08/28/20  [name], [name], MD  losartan-hydrochlorothiazide [facility]) 50-12.5 MG tablet Take 1 tablet by mouth daily. 05/23/20   [name], [name], MD  Multiple Vitamins-Minerals (ONE DAILY WOMENS 50 PLUS) TABS Take by mouth. Centrum Womens 50 plus MVI    [provider]  naproxen sodium (ALEVE) 220 MG tablet Take 220 mg by mouth as needed.     [provider]  omega-3 acid ethyl esters (LOVAZA) 1 g capsule Take 2 capsules (2 g total) by mouth 2 (two) times daily. 08/01/20   Hilty, Nadean Corwin, MD    Allergies    Lisinopril and Risperidone  Review of Systems   Review of Systems  Constitutional:  Negative for chills, fatigue and fever.  Respiratory:  Negative for cough and shortness of breath.   Gastrointestinal:  Positive for abdominal pain. Negative for diarrhea, flatus, nausea and vomiting.  Genitourinary:  Negative for dysuria and hematuria.  All other systems  reviewed and are negative.  Physical Exam Updated Vital Signs BP (!) 161/90   Pulse 79   Temp 98.8 F (37.1 C)   Resp 14   SpO2 97%   Physical Exam Vitals and nursing note reviewed.  Constitutional:      General: She is not in acute distress. HENT:     Head: Normocephalic and atraumatic.  Eyes:     Conjunctiva/sclera: Conjunctivae normal.     Pupils: Pupils are equal, round, and reactive to light.  Cardiovascular:     Rate and Rhythm: Normal rate and regular rhythm.     Heart sounds: Normal heart sounds.  Pulmonary:     Effort: Pulmonary effort is normal. No respiratory distress.     Breath sounds: Normal breath sounds.  Abdominal:     General: There is no distension.     Tenderness: There is abdominal tenderness in the left lower quadrant. There is no right CVA tenderness, left CVA tenderness, guarding or rebound.  Musculoskeletal:        General: No deformity or signs of injury.     Cervical back: Normal range of motion and neck supple.  Skin:    Findings: No lesion or rash.  Neurological:     General: No focal deficit present.     Mental Status: She is alert. Mental status is at baseline.    ED Results / Procedures / Treatments   Labs (all labs ordered are listed, but only abnormal results are displayed) Labs Reviewed  CBC WITH DIFFERENTIAL/PLATELET - Abnormal; Notable for the following components:      Result Value   RBC 5.12 (*)    All other components within normal limits  COMPREHENSIVE METABOLIC PANEL - Abnormal; Notable for the following components:   Glucose, Bld 102 (*)    All other components within normal limits  LIPASE, BLOOD  URINALYSIS, ROUTINE W REFLEX MICROSCOPIC    EKG None  Radiology CT ABDOMEN PELVIS W CONTRAST  Result Date: 12/17/2020 CLINICAL DATA:  Diverticulitis suspected. Left lower quadrant pain for 4 days. Excessive burping and passing gas. EXAM: CT ABDOMEN AND PELVIS WITH CONTRAST TECHNIQUE: Multidetector CT imaging of the  abdomen and pelvis was performed using the standard protocol following bolus administration of intravenous contrast. CONTRAST:  17mL OMNIPAQUE IOHEXOL 350 MG/ML SOLN COMPARISON:  None. FINDINGS: Lower chest: No acute abnormality. Hepatobiliary: Several scattered hypodense hepatic lesions with 2 largest measuring 1.3 cm (3:19) and 1.7 cm (3:6). Punctate gallstones noted within the gallbladder lumen. No gallbladder wall thickening or pericholecystic fluid. No biliary  dilatation. Pancreas: No focal lesion. Normal pancreatic contour. No surrounding inflammatory changes. No main pancreatic ductal dilatation. Spleen: Normal in size without focal abnormality. Several splenules are noted (3:21, 23). Adrenals/Urinary Tract: No adrenal nodule bilaterally. Bilateral kidneys enhance symmetrically. Subcentimeter hypodensities are too small to characterize. No hydronephrosis. No hydroureter.  No nephroureterolithiasis. The urinary bladder is unremarkable. Stomach/Bowel: Stomach is within normal limits. No evidence of bowel wall thickening or dilatation. Appendix appears normal. Vascular/Lymphatic: Phleboliths are noted within the pelvis along the ovarian veins. No abdominal aorta or iliac aneurysm. Severe atherosclerotic plaque of the aorta and its branches. No abdominal, pelvic, or inguinal lymphadenopathy. Reproductive: Status post hysterectomy. No adnexal masses. Other: No intraperitoneal free fluid. No intraperitoneal free gas. No organized fluid collection. Musculoskeletal: No abdominal wall hernia or abnormality. No suspicious lytic or blastic osseous lesions. No acute displaced fracture. Multilevel degenerative changes of the spine. IMPRESSION: 1. No acute intra-abdominal or intrapelvic abnormality. 2. Cholelithiasis with no CT findings of acute cholecystitis or choledocholithiasis. 3. Several indeterminate scattered hypodense hepatic lesions with 2 largest measuring 1.3 cm and 1.7 cm. Comparison with prior cross-sectional  imaging would be of value to evaluate stability. 4.  Aortic Atherosclerosis (ICD10-I70.0). Electronically Signed   By: Iven Finn M.D.   On: 12/17/2020 18:40    Procedures Procedures   Medications Ordered in ED Medications  morphine 4 MG/ML injection 4 mg (4 mg Intravenous Given 12/17/20 1717)  iohexol (OMNIPAQUE) 350 MG/ML injection 75 mL (75 mLs Intravenous Contrast Given 12/17/20 1735)    ED Course  I have reviewed the triage vital signs and the nursing notes.  Pertinent labs & imaging results that were available during my care of the patient were reviewed by me and considered in my medical decision making (see chart for details).    MDM Rules/Calculators/A&P                           65 year old female with medical history significant for hypertension, hyperlipidemia, anxiety, GERD, surgical history of hysterectomy who presents to the emergency department with left lower quadrant abdominal pain.  The patient states that the pain has been present for the past 4 days and has been intermittently waxing and waning, no radiation, associated with decreased bowel movements and decreased passage of gas.  The patient's last bowel movement this morning was a small 1.  She is not sure when she last passed gas.  She denies any fevers or chills.  She denies any diarrhea, nausea or vomiting.  She does state that she has been burping more.  On arrival, the patient was afebrile, mildly hypertensive BP 154/85, not tachycardic or tachypneic, saturating under percent on room air.  Physical exam significant for mild left lower quadrant tenderness palpation without rebound or guarding.  No CVA tenderness. The patient  complained of 7/10 abdominal pain on arrival and was administered IV Morphine.   DDx: Diverticulosis/diverticulitis, hernia, small bowel obstruction, nephrolithiasis, UTI/pyelonephritis, constipation.  The patient's laboratory work-up was significant for normal lipase, no leukocytosis anemia  or platelet abnormality, CMP that was unremarkable, urinalysis without evidence of UTI.  No evidence for UTI/pyelonephritis.  No evidence for pancreatitis.  No palpable hernia on physical exam.  CT abdomen pelvis was performed which revealed no acute abnormality in the abdomen or pelvis.  No focal findings in the left lower quadrant to explain the patient's abdominal discomfort.  As the patient is currently moving her bowels with no evidence of bowel obstruction  feel that patient is overall stable for outpatient management.  Recommended a course of Bentyl and Senna and PCP follow-up.  The patient has been appropriately medically screened and/or stabilized in the ED. I have low suspicion for any other emergent medical condition which would require further screening, evaluation or treatment in the ED or require inpatient management.    Final Clinical Impression(s) / ED Diagnoses Final diagnoses:  Left lower quadrant abdominal pain    Rx / DC Orders ED Discharge Orders          Ordered    dicyclomine (BENTYL) 20 MG tablet  2 times daily        12/17/20 1903    senna-docusate (SENOKOT-S) 8.6-50 MG tablet  Daily        12/17/20 1906             Regan Lemming, MD 12/17/20 1909

## 2020-12-17 NOTE — Discharge Instructions (Addendum)
Your CT did not reveal evidence of an acute abnormality in your abdomen or pelvis. No evidence of bowel obstruction, hernia, diverticulosis or diverticulitis. Recommend Bentyl for 10 days for burping and cramping. Follow-up with your primary physician for worsening of symptoms. Also recommend senna for possible constipation.   You were evaluated in the Emergency Department and after careful evaluation, we did not find any emergent condition requiring admission or further testing in the hospital.  Your exam/testing today was overall reassuring.  Please return to the Emergency Department if you experience any worsening of your condition.  Thank you for allowing [place] to be a part of your care.

## 2020-12-25 ENCOUNTER — Other Ambulatory Visit (HOSPITAL_COMMUNITY)
Admission: RE | Admit: 2020-12-25 | Discharge: 2020-12-25 | Disposition: A | Discharge status: Home / Self Care | Payer: Medicare Other | Source: Ambulatory Visit | Attending: Obstetrics & Gynecology | Admitting: Obstetrics & Gynecology

## 2020-12-25 ENCOUNTER — Ambulatory Visit (INDEPENDENT_AMBULATORY_CARE_PROVIDER_SITE_OTHER): Payer: Medicare Other | Admitting: Obstetrics & Gynecology

## 2020-12-25 ENCOUNTER — Other Ambulatory Visit: Payer: Self-pay

## 2020-12-25 ENCOUNTER — Encounter: Payer: Self-pay | Admitting: Obstetrics & Gynecology

## 2020-12-25 VITALS — BP 114/72 | HR 80 | Resp 16 | Ht 64.5 in | Wt 199.0 lb

## 2020-12-25 DIAGNOSIS — Z1272 Encounter for screening for malignant neoplasm of vagina: Secondary | ICD-10-CM | POA: Diagnosis present

## 2020-12-25 DIAGNOSIS — Z90710 Acquired absence of both cervix and uterus: Secondary | ICD-10-CM | POA: Diagnosis not present

## 2020-12-25 DIAGNOSIS — E6609 Other obesity due to excess calories: Secondary | ICD-10-CM

## 2020-12-25 DIAGNOSIS — Z01419 Encounter for gynecological examination (general) (routine) without abnormal findings: Secondary | ICD-10-CM | POA: Diagnosis not present

## 2020-12-25 DIAGNOSIS — Z6833 Body mass index (BMI) 33.0-33.9, adult: Secondary | ICD-10-CM

## 2020-12-25 DIAGNOSIS — Z780 Asymptomatic menopausal state: Secondary | ICD-10-CM | POA: Diagnosis not present

## 2020-12-25 NOTE — Progress Notes (Signed)
Vanessa Price Oct 12, 1955 [id number]   History:    65 y.o. [id number] female   RP:  Established patient presenting for annual gynecologic exam   HPI:  Status post TAH for menorrhagia by Dr. Ree Edman. Postmenopausal well on no HRT.  No pelvic pain.  Abstinent.  Pap Neg in 2020.  Vitiligo in the vulvar region. Asymptomatic to the patient without symptoms of lichen sclerosis. Breasts normal. Screening mammo scheduled next week.  Urine/BMs normal.  Colono 2019.  BMI 33.63.  Health labs with Fam MD.   Past medical history,surgical history, family history and social history were all reviewed and documented in the EPIC chart.  Gynecologic History No LMP recorded. Patient has had a hysterectomy.  Obstetric History OB History  Gravida Para Term Preterm AB Living  5 2 2   3 2   SAB IAB Ectopic Multiple Live Births               # Outcome Date GA Lbr Len/2nd Weight Sex Delivery Anes PTL Lv  5 Term           4 Term           3 AB           2 AB           1 AB              ROS: A ROS was performed and pertinent positives and negatives are included in the history.  GENERAL: No fevers or chills. HEENT: No change in vision, no earache, sore throat or sinus congestion. NECK: No pain or stiffness. CARDIOVASCULAR: No chest pain or pressure. No palpitations. PULMONARY: No shortness of breath, cough or wheeze. GASTROINTESTINAL: No abdominal pain, nausea, vomiting or diarrhea, melena or bright red blood per rectum. GENITOURINARY: No urinary frequency, urgency, hesitancy or dysuria. MUSCULOSKELETAL: No joint or muscle pain, no back pain, no recent trauma. DERMATOLOGIC: No rash, no itching, no lesions. ENDOCRINE: No polyuria, polydipsia, no heat or cold intolerance. No recent change in weight. HEMATOLOGICAL: No anemia or easy bruising or bleeding. NEUROLOGIC: No headache, seizures, numbness, tingling or weakness. PSYCHIATRIC: No depression, no loss of interest in normal activity or change in sleep  pattern.     Exam:   BP 114/72   Pulse 80   Resp 16   Ht 5' 4.5" (1.638 m)   Wt 199 lb (90.3 kg)   BMI 33.63 kg/m   Body mass index is 33.63 kg/m.  General appearance : Well developed well nourished female. No acute distress HEENT: Eyes: no retinal hemorrhage or exudates,  Neck supple, trachea midline, no carotid bruits, no thyroidmegaly Lungs: Clear to auscultation, no rhonchi or wheezes, or rib retractions  Heart: Regular rate and rhythm, no murmurs or gallops Breast:Examined in sitting and supine position were symmetrical in appearance, no palpable masses or tenderness,  no skin retraction, no nipple inversion, no nipple discharge, no skin discoloration, no axillary or supraclavicular lymphadenopathy Abdomen: no palpable masses or tenderness, no rebound or guarding Extremities: no edema or skin discoloration or tenderness  Pelvic: Vulva: Normal             Vagina: No gross lesions or discharge.  Pap reflex done.  Cervix/Uterus absent  Adnexa  Without masses or tenderness  Anus: Normal   Assessment/Plan:  65 y.o. female for annual exam   1. Encounter for Papanicolaou smear of vagina as part of routine gynecological examination Gynecologic exam status post total abdominal hysterectomy.  Pap  reflex done on the vaginal vault.  Breast exam normal.  Screening mammogram to schedule now.  Colonoscopy 2019. - Cytology - PAP( [facility])  2. S/P TAH (total abdominal hysterectomy)  3. Postmenopause Well on no hormone replacement therapy.  4. Class 1 obesity due to excess calories with serious comorbidity and body mass index (BMI) of 33.0 to 33.9 in adult  Lower calorie/carb diet.  Aerobic activities 5 times a week and light weightlifting every 2 days.  [name] MD, 2:09 PM 12/25/2020

## 2020-12-26 ENCOUNTER — Telehealth: Payer: Self-pay | Admitting: Pharmacist

## 2020-12-26 ENCOUNTER — Other Ambulatory Visit: Payer: Self-pay

## 2020-12-26 LAB — CYTOLOGY - PAP: Diagnosis: NEGATIVE

## 2020-12-26 NOTE — Chronic Care Management (AMB) (Signed)
Chronic Care Management Pharmacy Assistant   Name: Vanessa Price  MRN: [id number] DOB: Jun 20, 1955  Reason for Encounter: Disease State/ Hypertension Assessment Call.    Conditions to be addressed/monitored: HTN   Recent office visits:    Recent consult visits:  12/25/20 Vanessa Bruins MD (Obstetrics and Gynecology) - seen for encounter for papanicolaou smear of vagina for routine examination. No medication changes. Follow up in 1 year.   Hospital visits:  Medication Reconciliation was completed by comparing discharge summary, patient's EMR and Pharmacy list, and upon discussion with patient.  Patient visited [facility] on 12/17/20 for 8 hours due to left lower quadrant abdominal pain.   New? Medications Started at [facility] Discharge:?? -started Dicyclomine 20mg  2 times daily and Sennosides-Docusate sodium daily.  Medication Changes at Hospital Discharge: -Changed None.  Medications Discontinued at Hospital Discharge: -Stopped None.  Medications that remain the same after Hospital Discharge:??  -All other medications will remain the same.    Medications: Outpatient Encounter Medications as of 12/26/2020  Medication Sig   dicyclomine (BENTYL) 20 MG tablet Take 1 tablet (20 mg total) by mouth 2 (two) times daily.   Evolocumab (REPATHA SURECLICK) 503 MG/ML SOAJ INJECT 1 DOSE INTO THE SKIN EVERY 14 DAYS   fenofibrate (TRICOR) 145 MG tablet TAKE 1 TABLET BY MOUTH  DAILY   losartan-hydrochlorothiazide (HYZAAR) 50-12.5 MG tablet Take 1 tablet by mouth daily.   Multiple Vitamins-Minerals (ONE DAILY WOMENS 50 PLUS) TABS Take by mouth. Centrum Womens 50 plus MVI   naproxen sodium (ALEVE) 220 MG tablet Take 220 mg by mouth as needed.    omega-3 acid ethyl esters (LOVAZA) 1 g capsule Take 2 capsules (2 g total) by mouth 2 (two) times daily.   senna-docusate (SENOKOT-S) 8.6-50 MG tablet Take 1 tablet by mouth daily.   No facility-administered encounter  medications on file as of 12/26/2020.   Fill history: REPATHA SURE 140MG /ML PEN INJ 11/29/2020 28   FENOFIBRATE  145 MG TABS 09/12/2020 90   LOSARTAN/HCT 50-12.5MG  TAB 11/24/2020 90   OMEGA-3-ACID ETHYL ESTERS  1 GM CAPS 10/15/2020 90   Reviewed chart prior to disease state call. Spoke with patient regarding BP  Recent Office Vitals: BP Readings from Last 3 Encounters:  12/25/20 114/72  12/17/20 (!) 167/89  08/31/20 130/80   Pulse Readings from Last 3 Encounters:  12/25/20 80  12/17/20 82  08/31/20 82    Wt Readings from Last 3 Encounters:  12/25/20 199 lb (90.3 kg)  08/31/20 197 lb 12.8 oz (89.7 kg)  08/06/20 197 lb 12.8 oz (89.7 kg)     Kidney Function Lab Results  Component Value Date/Time   CREATININE 0.81 12/17/2020 11:36 AM   CREATININE 0.94 08/31/2020 08:05 AM   GFR 63.09 08/31/2020 08:05 AM   GFRNONAA >60 12/17/2020 11:36 AM   GFRAA 96 01/04/2008 12:00 AM    BMP Latest Ref Rng & Units 12/17/2020 08/31/2020 05/31/2020  Glucose 70 - 99 mg/dL 102(H) 100(H) 99  BUN 8 - 23 mg/dL 13 20 18   Creatinine 0.44 - 1.00 mg/dL 0.81 0.94 0.92  Sodium 135 - 145 mmol/L 137 144 143  Potassium 3.5 - 5.1 mmol/L 4.0 3.9 3.9  Chloride 98 - 111 mmol/L 106 108 106  CO2 22 - 32 mmol/L 23 27 27   Calcium 8.9 - 10.3 mg/dL 9.4 9.8 9.8    Current antihypertensive regimen:  Losartan-hydrochlorothiazide 50-12.5mg  - take 1 tablet by mouth daily.  How often are you checking your Blood Pressure? daily  Current home BP readings: 117/80 on 12/28/20.  What recent interventions/DTPs have been made by any provider to improve Blood Pressure control since last CPP Visit: None. Any recent hospitalizations or ED visits since last visit with CPP? Yes  Adherence Review: Is the patient currently on ACE/ARB medication? Yes Does the patient have >5 day gap between last estimated fill dates? No  Notes: Spoke with patient and reviewed medications. Patient reports no changes or issues with medications at  this time. Patient states she eats what she wants. However, her blood pressure runs normal except for when she was having some abdominal pain it did shoot up at times. Patient stated she stays pretty active and does things around home. Patient stated there was  CT scan done on her abdomen and they told her it was constipations causing the pain. Patient is feeling better now. Patient thanked me for my call.   Care Gaps:  AWV - message sent to [name] CMA to schedule. Flu vaccine - due Mammogram - overdue Last PCP BP: 120/70 P: 81  Star Rating Drugs:  Losartan-hydrochlorothiazide 50-12.5mg  - last filled on 11/24/20 90DS at [facility] Pharmacist Assistant [phone]

## 2020-12-27 ENCOUNTER — Ambulatory Visit (INDEPENDENT_AMBULATORY_CARE_PROVIDER_SITE_OTHER): Payer: Medicare Other | Admitting: Internal Medicine

## 2020-12-27 ENCOUNTER — Encounter: Payer: Self-pay | Admitting: Internal Medicine

## 2020-12-27 VITALS — BP 120/70 | HR 81 | Temp 98.1°F | Wt 201.1 lb

## 2020-12-27 DIAGNOSIS — R1032 Left lower quadrant pain: Secondary | ICD-10-CM

## 2020-12-27 DIAGNOSIS — Z1231 Encounter for screening mammogram for malignant neoplasm of breast: Secondary | ICD-10-CM | POA: Diagnosis not present

## 2020-12-27 DIAGNOSIS — K5900 Constipation, unspecified: Secondary | ICD-10-CM | POA: Diagnosis not present

## 2020-12-27 NOTE — Progress Notes (Signed)
Acute office Visit     This visit occurred during the SARS-CoV-2 public health emergency.  Safety protocols were in place, including screening questions prior to the visit, additional usage of staff PPE, and extensive cleaning of exam room while observing appropriate contact time as indicated for disinfecting solutions.    CC/Reason for Visit: ED follow-up, continued left lower quadrant pain  HPI: Verbena Price is a 65 y.o. female who is coming in today for the above mentioned reasons.  She was seen in the emergency department on the left lower quadrant pain.  She had a full work-up including a CT scan of the abdomen and pelvis that only showed cholelithiasis.  She visited her gynecologist 2 days ago.  She continues to have left lower quadrant pain.  It is intermittent, it is worse sometimes at nighttime.  She has no dysuria, no urinary frequency.  She has a bowel movement about 3 times a week, lately she describes them as "hard little clumps".  Past Medical/Surgical History: Past Medical History:  Diagnosis Date   ANXIETY 06/04/2009   GERD 08/14/2008   occasional   Hx of adenomatous polyp of colon 03/11/2018   HYPERLIPIDEMIA 09/14/2006   HYPERTENSION 09/14/2006   Impaired glucose tolerance    MYOCARDIAL PERFUSION SCAN, WITH STRESS TEST, ABNORMAL 10/19/2008   PREMATURE VENTRICULAR CONTRACTIONS 08/14/2008    Past Surgical History:  Procedure Laterality Date   ABDOMINAL HYSTERECTOMY  1999   menorrhagia/partial   CESAREAN SECTION     2 times    Social History:  reports that she quit smoking about 27 years ago. Her smoking use included cigarettes. She has never used smokeless tobacco. She reports that she does not currently use alcohol. She reports that she does not use drugs.  Allergies: Allergies  Allergen Reactions   Lisinopril     REACTION: cough   Risperidone     REACTION: unsure of dose; pt describes reaction of bad cough    Family History:  Family  History  Problem Relation Age of Onset   Arthritis Mother    Diabetes Mother    Heart disease Mother    Mental illness Father    Cancer Brother        Lung     Current Outpatient Medications:    dicyclomine (BENTYL) 20 MG tablet, Take 1 tablet (20 mg total) by mouth 2 (two) times daily., Disp: 20 tablet, Rfl: 0   Evolocumab (REPATHA SURECLICK) 884 MG/ML SOAJ, INJECT 1 DOSE INTO THE SKIN EVERY 14 DAYS, Disp: 2 mL, Rfl: 11   losartan-hydrochlorothiazide (HYZAAR) 50-12.5 MG tablet, Take 1 tablet by mouth daily., Disp: 90 tablet, Rfl: 1   Multiple Vitamins-Minerals (ONE DAILY WOMENS 50 PLUS) TABS, Take by mouth. Centrum Womens 50 plus MVI, Disp: , Rfl:    naproxen sodium (ALEVE) 220 MG tablet, Take 220 mg by mouth as needed. , Disp: , Rfl:    omega-3 acid ethyl esters (LOVAZA) 1 g capsule, Take 2 capsules (2 g total) by mouth 2 (two) times daily., Disp: 360 capsule, Rfl: 3   senna-docusate (SENOKOT-S) 8.6-50 MG tablet, Take 1 tablet by mouth daily., Disp: 30 tablet, Rfl: 0   fenofibrate (TRICOR) 145 MG tablet, TAKE 1 TABLET BY MOUTH  DAILY (Patient not taking: Reported on 12/27/2020), Disp: 90 tablet, Rfl: 1  Review of Systems:  Constitutional: Denies fever, chills, diaphoresis, appetite change and fatigue.  HEENT: Denies photophobia, eye pain, redness, hearing loss, ear pain, congestion, sore throat, rhinorrhea, sneezing, mouth  sores, trouble swallowing, neck pain, neck stiffness and tinnitus.   Respiratory: Denies SOB, DOE, cough, chest tightness,  and wheezing.   Cardiovascular: Denies chest pain, palpitations and leg swelling.  Gastrointestinal: Denies nausea, vomiting,  diarrhea, blood in stool and abdominal distention.  Genitourinary: Denies dysuria, urgency, frequency, hematuria, flank pain and difficulty urinating.  Endocrine: Denies: hot or cold intolerance, sweats, changes in hair or nails, polyuria, polydipsia. Musculoskeletal: Denies myalgias, back pain, joint swelling,  arthralgias and gait problem.  Skin: Denies pallor, rash and wound.  Neurological: Denies dizziness, seizures, syncope, weakness, light-headedness, numbness and headaches.  Hematological: Denies adenopathy. Easy bruising, personal or family bleeding history  Psychiatric/Behavioral: Denies suicidal ideation, mood changes, confusion, nervousness, sleep disturbance and agitation    Physical Exam: Vitals:   12/27/20 1045  BP: 120/70  Pulse: 81  Temp: 98.1 F (36.7 C)  TempSrc: Oral  SpO2: 99%  Weight: 201 lb 1.6 oz (91.2 kg)    Body mass index is 33.99 kg/m.   Constitutional: NAD, calm, comfortable Eyes: PERRL, lids and conjunctivae normal, wears corrective lenses ENMT: Mucous membranes are moist.  Abdomen: no tenderness, no masses palpated. No hepatosplenomegaly. Bowel sounds positive.  Psychiatric: Normal judgment and insight. Alert and oriented x 3. Normal mood.    Impression and Plan:  LLQ pain Constipation, unspecified constipation type  -Based on description of pain as well as description of stool consistency and frequency I suspect that constipation is at play. -Have advised increase fluid consumption, have also advised her to take Colace and MiraLAX daily. -She knows to reach out to [place] if issues persist.  Time spent: 30 minutes reviewing hospital charts, interviewing and examining patient and formulating plan of care.     [name], MD [facility]

## 2020-12-28 ENCOUNTER — Encounter: Payer: Self-pay | Admitting: Obstetrics & Gynecology

## 2020-12-28 NOTE — Telephone Encounter (Signed)
Spoke with patient who was trying to call back for assessment call. She reported she never heard from cardiology. Refer to cardiology telephone note on 8/23 as they never got in touch with the patient.  Informed patient of recommendations from Dr. Debara Pickett. Patient opted to hold her fenofibrate until her office visit in November to see if this resolved side effects.

## 2021-01-02 DIAGNOSIS — H25013 Cortical age-related cataract, bilateral: Secondary | ICD-10-CM | POA: Diagnosis not present

## 2021-01-02 DIAGNOSIS — H2513 Age-related nuclear cataract, bilateral: Secondary | ICD-10-CM | POA: Diagnosis not present

## 2021-01-08 DIAGNOSIS — R928 Other abnormal and inconclusive findings on diagnostic imaging of breast: Secondary | ICD-10-CM | POA: Diagnosis not present

## 2021-01-16 ENCOUNTER — Telehealth: Payer: Self-pay | Admitting: Pharmacist

## 2021-01-16 NOTE — Chronic Care Management (AMB) (Signed)
    Chronic Care Management Pharmacy Assistant   Name: Britanni Yarde  MRN: [id number] DOB: October 18, 1955  Spoke with Daleen Snook at [facility] ot check on patients application for repatha assistance. Daleen Snook then connected me with [employer] and I spoke with Romie Minus who stated that patients application was denied due to patient having coverage for repatha through her united healthcare medicare insurance. Patient does not qualify. Romie Minus thanked me for my call.    Medications: Outpatient Encounter Medications as of 01/16/2021  Medication Sig   dicyclomine (BENTYL) 20 MG tablet Take 1 tablet (20 mg total) by mouth 2 (two) times daily.   Evolocumab (REPATHA SURECLICK) 859 MG/ML SOAJ INJECT 1 DOSE INTO THE SKIN EVERY 14 DAYS   fenofibrate (TRICOR) 145 MG tablet TAKE 1 TABLET BY MOUTH  DAILY (Patient not taking: Reported on 12/27/2020)   losartan-hydrochlorothiazide (HYZAAR) 50-12.5 MG tablet Take 1 tablet by mouth daily.   Multiple Vitamins-Minerals (ONE DAILY WOMENS 50 PLUS) TABS Take by mouth. Centrum Womens 50 plus MVI   naproxen sodium (ALEVE) 220 MG tablet Take 220 mg by mouth as needed.    omega-3 acid ethyl esters (LOVAZA) 1 g capsule Take 2 capsules (2 g total) by mouth 2 (two) times daily.   senna-docusate (SENOKOT-S) 8.6-50 MG tablet Take 1 tablet by mouth daily.   No facility-administered encounter medications on file as of 01/16/2021.    Care Gaps:  AWV - message sent to [name] CMA to schedule. Flu vaccine - due Mammogram - overdue Last PCP BP: 120/70 P: 81  Star Rating Drugs:  Losartan-hydrochlorothiazide 50-12.5mg  - last filled on 11/24/20 90DS at [facility] Pharmacist Assistant [phone]

## 2021-01-17 ENCOUNTER — Ambulatory Visit: Payer: Medicare Other | Admitting: Neurology

## 2021-01-17 ENCOUNTER — Other Ambulatory Visit: Payer: Self-pay

## 2021-01-17 ENCOUNTER — Encounter: Payer: Self-pay | Admitting: Neurology

## 2021-01-17 VITALS — BP 159/93 | HR 87 | Ht 64.0 in | Wt 203.0 lb

## 2021-01-17 DIAGNOSIS — E669 Obesity, unspecified: Secondary | ICD-10-CM

## 2021-01-17 DIAGNOSIS — R0683 Snoring: Secondary | ICD-10-CM

## 2021-01-17 NOTE — Patient Instructions (Signed)
Screening for Sleep Apnea Sleep apnea is a condition in which breathing pauses or becomes shallow during sleep. Sleep apnea screening is a test to determine if you are at risk for sleep apnea. The test includes a series of questions. It will only takes a few minutes. Your health care provider may ask you to have this test in preparation for surgery or as part of a physical exam. What are the symptoms of sleep apnea? Common symptoms of sleep apnea include: Snoring. Waking up often at night. Daytime sleepiness. Pauses in breathing. Choking or gasping during sleep. Irritability. Forgetfulness. Trouble thinking clearly. Depression. Personality changes. Most people with sleep apnea do not know that they have it. What are the advantages of sleep apnea screening? Getting screened for sleep apnea can help: Ensure your safety. It is important for your health care providers to know whether or not you have sleep apnea, especially if you are having surgery or have other long-term (chronic) health conditions. Improve your health and allow you to get a better night's rest. Restful sleep can help you: Have more energy. Lose weight. Improve high blood pressure. Improve diabetes management. Prevent stroke. Prevent car accidents. What happens during the screening? Screening usually includes being asked a list of questions about your sleep quality. Some questions you may be asked include: Do you snore? Is your sleep restless? Do you have daytime sleepiness? Has a partner or spouse told you that you stop breathing during sleep? Have you had trouble concentrating or memory loss? What is your age? What is your neck circumference? To measure your neck, keep your back straight and gently wrap the tape measure around your neck. Put the tape measure at the middle of your neck, between your chin and collarbone. What is your sex assigned at birth? Do you have or are you being treated for high blood  pressure? If your screening test is positive, you are at risk for the condition. Further testing may be needed to confirm a diagnosis of sleep apnea. Where to find more information You can find screening tools online or at your health care clinic. For more information about sleep apnea screening and healthy sleep, visit these websites: Centers for Disease Control and Prevention: www.cdc.gov American Sleep Apnea Association: www.sleepapnea.org Contact a health care provider if: You think that you may have sleep apnea. Summary Sleep apnea screening can help determine if you are at risk for sleep apnea. It is important for your health care providers to know whether or not you have sleep apnea, especially if you are having surgery or have other chronic health conditions. You may be asked to take a screening test for sleep apnea in preparation for surgery or as part of a physical exam. This information is not intended to replace advice given to you by your health care provider. Make sure you discuss any questions you have with your health care provider. Document Revised: 02/17/2020 Document Reviewed: 02/17/2020 Elsevier Patient Education  2022 Elsevier Inc.  

## 2021-01-17 NOTE — Progress Notes (Signed)
SLEEP MEDICINE CLINIC    Provider:  Larey Seat, MD  Primary Care Physician:  [name], [name], MD [place] [address]     Referring Provider: [name], [name], [place] [address] [place],  [place] [address]          Chief Complaint according to patient   Patient presents with:     New Patient (Initial Visit)           HISTORY OF PRESENT ILLNESS:  Vanessa Price is a 65 y.o. African American female patient  and was seen upon referral on 01/17/2021 from PCP.  Chief concern according to patient :  " my daughter noted I am snoring louder, but I snored for years. My weight is stabile, but I am overweight. In summer ,I became short of breath, even at rest, supine. "    [place]   has a past medical history of ANXIETY (06/04/2009), GERD (08/14/2008), adenomatous polyp of colon (03/11/2018), HYPERLIPIDEMIA (09/14/2006), HYPERTENSION (09/14/2006), Impaired glucose tolerance, MYOCARDIAL PERFUSION SCAN, WITH STRESS TEST, ABNORMAL (10/19/2008), and PREMATURE VENTRICULAR CONTRACTIONS (08/14/2008).Obesity, BMI 34.      Sleep relevant medical history: Nocturia 1-2, no ENT/Tonsillectomy.    Family medical /sleep history: no other family member on CPAP with OSA.    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  The patient currently used to work in shifts( night/ rotating,) Pets are not present. Tobacco use quit 1995.  ETOH use: rare ,  Caffeine intake in form of Coffee( /) Soda( /) Tea ( /) or energy drinks. Hobbies : reading. Walking.      Sleep habits are as follows: The patient's dinner time is between 5-6 PM. The patient goes to bed at 10 PM and continues to sleep for 3-5 hours, wakes for 1-2 bathroom breaks.   The preferred sleep position is prone , with the support of 1 pillow.  Dreams are reportedly rare.   7-8  AM is the usual rise time. The patient wakes up spontaneously.   She reports not feeling refreshed or restored in AM, without symptoms . Naps are taken infrequently, she can't nap-    Review of Systems: Out of a complete 14 system review, the patient complains of only the following symptoms, and all other reviewed systems are negative.:  Fatigue, sleepiness , snoring, fragmented sleep, unable to nap.    How likely are you to doze in the following situations: 0 = not likely, 1 = slight chance, 2 = moderate chance, 3 = high chance   Sitting and Reading? Watching Television? Sitting inactive in a public place (theater or meeting)? As a passenger in a car for an hour without a break? Lying down in the afternoon when circumstances permit? Sitting and talking to someone? Sitting quietly after lunch without alcohol? In a car, while stopped for a few minutes in traffic?   Total = 1/ 24 points   FSS endorsed at 12/ 63 points.  GDS1/ 15  Social History   Socioeconomic History   Marital status: Widowed    Spouse name: Not on file   Number of children: 2   Years of education: Not on file   Highest education level: Not on file  Occupational History   Not on file  Tobacco Use   Smoking status: Former    Types: Cigarettes    Quit date:  03/24/1993    Years since quitting: 27.8   Smokeless tobacco: Never  Vaping Use   Vaping Use: Never used  Substance and Sexual Activity   Alcohol use: Not Currently    Comment: social   Drug use: No   Sexual activity: Not Currently    Birth control/protection: Surgical    Comment: HYst-1st intercourse 65 yo-More than 5 partners, hysterectomy  Other Topics Concern   Not on file  Social History Narrative   Live with daughter   Right handed   Caffeine: some days non and sometimes 2 cups of coffee a week   Social Determinants of Health   Financial Resource Strain: Not on file  Food Insecurity: Not on file  Transportation Needs: Not on file   Physical Activity: Not on file  Stress: Not on file  Social Connections: Not on file    Family History  Problem Relation Age of Onset   Arthritis Mother    Diabetes Mother    Heart disease Mother    Mental illness Father    Cancer Brother        Lung    Past Medical History:  Diagnosis Date   ANXIETY 06/04/2009   GERD 08/14/2008   occasional   Hx of adenomatous polyp of colon 03/11/2018   HYPERLIPIDEMIA 09/14/2006   HYPERTENSION 09/14/2006   Impaired glucose tolerance    MYOCARDIAL PERFUSION SCAN, WITH STRESS TEST, ABNORMAL 10/19/2008   PREMATURE VENTRICULAR CONTRACTIONS 08/14/2008    Past Surgical History:  Procedure Laterality Date   ABDOMINAL HYSTERECTOMY  1999   menorrhagia/partial   CESAREAN SECTION     2 times     Current Outpatient Medications on File Prior to Visit  Medication Sig Dispense Refill   Evolocumab (REPATHA SURECLICK) 542 MG/ML SOAJ INJECT 1 DOSE INTO THE SKIN EVERY 14 DAYS 2 mL 11   fenofibrate (TRICOR) 145 MG tablet TAKE 1 TABLET BY MOUTH  DAILY 90 tablet 1   losartan-hydrochlorothiazide (HYZAAR) 50-12.5 MG tablet Take 1 tablet by mouth daily. 90 tablet 1   Multiple Vitamins-Minerals (ONE DAILY WOMENS 50 PLUS) TABS Take by mouth. Centrum Womens 50 plus MVI     naproxen sodium (ALEVE) 220 MG tablet Take 220 mg by mouth as needed.      omega-3 acid ethyl esters (LOVAZA) 1 g capsule Take 2 capsules (2 g total) by mouth 2 (two) times daily. 360 capsule 3   No current facility-administered medications on file prior to visit.    Allergies  Allergen Reactions   Lisinopril     REACTION: cough   Risperidone     REACTION: unsure of dose; pt describes reaction of bad cough    Physical exam:  Today's Vitals   01/17/21 0835  BP: (!) 159/93  Pulse: 87  Weight: 203 lb (92.1 kg)  Height: 5\' 4"  (1.626 m)   Body mass index is 34.84 kg/m.   Wt Readings from Last 3 Encounters:  01/17/21 203 lb (92.1 kg)  12/27/20 201 lb 1.6 oz (91.2 kg)  12/25/20  199 lb (90.3 kg)     Ht Readings from Last 3 Encounters:  01/17/21 5\' 4"  (1.626 m)  12/25/20 5' 4.5" (1.638 m)  08/31/20 5' 4.5" (1.638 m)      General: The patient is awake, alert and appears not in acute distress. The patient is well groomed. Head: Normocephalic, atraumatic. Neck is supple. Mallampati 3 plus,  neck circumference:16.5  inches . Nasal airflow  patent.  Retrognathia is  seen.  Dental status:  Cardiovascular:  Regular rate and cardiac rhythm by pulse,  without distended neck veins. Respiratory: Lungs are clear to auscultation.  Skin:  Without evidence of ankle edema, or rash. Trunk: The patient's posture is erect.   Neurologic exam : The patient is awake and alert, oriented to place and time.   Memory subjective described as intact.  Attention span & concentration ability appears normal.  Speech is fluent,  without  dysarthria, dysphonia or aphasia.  Mood and affect are appropriate.   Cranial nerves: no loss of smell or taste reported  Pupils are equal and briskly reactive to light. Funduscopic exam deferred.. cataract present.  Extraocular movements in vertical and horizontal planes were intact and without nystagmus. No Diplopia. Visual fields by finger perimetry are intact. Hearing was intact to soft voice and finger rubbing.    Facial sensation intact to fine touch.  Facial motor strength is symmetric and tongue and uvula move midline.  Neck ROM : rotation, tilt and flexion extension were normal for age and shoulder shrug was symmetrical.    Motor exam:  Symmetric bulk, tone and ROM.   Normal tone without cog wheeling, symmetric grip strength .   Sensory:  Fine touch, pinprick and vibration were tested  and  normal.  Proprioception tested in the upper extremities was normal.   Coordination: Rapid alternating movements in the fingers/hands were of normal speed.  The Finger-to-nose maneuver was intact without evidence of ataxia, dysmetria or tremor.   Gait  and station: Patient could rise unassisted from a seated position, walked without assistive device.  Stance is of normal width/ base and the patient turned with 3 steps.  Toe and heel walk were deferred.  Deep tendon reflexes: in the  upper and lower extremities are symmetric and intact.  Babinski response was deferred .       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  She has the following risk factors for obstructive sleep apnea; her BMI has been close to 35 but is just under, her neck size is slightly larger than normal for her peer group but most impressive is her high-grade Mallampati and the presence of retrognathia instead of prognathia.  These are anatomical risk factors for the presence of obstructive sleep apnea.  She is a former smoker but does not carry any diagnoses related to the tobacco use such as COPD etc. She noted that sleep in supine position is usually interrupted and she will have to catch her breath at night.  Her daughter has noticed that her snoring has become louder also her weight seems to have been stable for the last 2 years.  I need to add that her daughter just moved back in with her and that she may have not been as aware of her mother snoring in the years before.  So I will order a screening test for the presence of obstructive sleep apnea and this will be a home sleep test.  Depending on the degree of apnea and associated findings such as central apnea, hypoxia, REM sleep dependence we then can discuss if a dental  device, an inspire device before a CPAP is the best  treatment for her.  I would certainly encourage her to lose weight.    My Plan is to proceed with:  1)  HST   I would like to thank [name], [name], [place] and [name], [name], [place] [place],  [place] [address] for allowing me to meet with and to take care of this pleasant patient.   In short, Vanessa Price is presenting with snoring, but not EDS.   I plan to follow up either personally or through our NP within 2-4 month.    Electronically signed by: Larey Seat, MD 01/17/2021 9:20 AM  [facility] Board certified by The [employer] of Sleep Medicine and Diplomate of the [employer] of Sleep Medicine. Board certified In Neurology through the [place], Fellow of the [employer] of Neurology. Medical Director of [facility].

## 2021-01-22 DIAGNOSIS — H25811 Combined forms of age-related cataract, right eye: Secondary | ICD-10-CM | POA: Diagnosis not present

## 2021-01-22 DIAGNOSIS — H25011 Cortical age-related cataract, right eye: Secondary | ICD-10-CM | POA: Diagnosis not present

## 2021-01-22 DIAGNOSIS — H2511 Age-related nuclear cataract, right eye: Secondary | ICD-10-CM | POA: Diagnosis not present

## 2021-01-24 NOTE — Telephone Encounter (Signed)
Resent patient lab reminder

## 2021-01-28 DIAGNOSIS — E785 Hyperlipidemia, unspecified: Secondary | ICD-10-CM | POA: Diagnosis not present

## 2021-01-28 LAB — LIPID PANEL
Chol/HDL Ratio: 3.3 ratio (ref 0.0–4.4)
Cholesterol, Total: 147 mg/dL (ref 100–199)
HDL: 44 mg/dL (ref >39)
LDL Chol Calc (NIH): 75 mg/dL (ref 0–99)
Triglycerides: 162 mg/dL — ABNORMAL HIGH (ref 0–149)
VLDL Cholesterol Cal: 28 mg/dL (ref 5–40)

## 2021-02-04 ENCOUNTER — Other Ambulatory Visit: Payer: Self-pay

## 2021-02-04 ENCOUNTER — Encounter: Payer: Self-pay | Admitting: Internal Medicine

## 2021-02-04 ENCOUNTER — Ambulatory Visit: Payer: Medicare Other | Admitting: Internal Medicine

## 2021-02-04 VITALS — BP 136/82 | HR 86 | Ht 64.0 in | Wt 200.8 lb

## 2021-02-04 DIAGNOSIS — E668 Other obesity: Secondary | ICD-10-CM

## 2021-02-04 DIAGNOSIS — E785 Hyperlipidemia, unspecified: Secondary | ICD-10-CM

## 2021-02-04 DIAGNOSIS — T466X5D Adverse effect of antihyperlipidemic and antiarteriosclerotic drugs, subsequent encounter: Secondary | ICD-10-CM | POA: Diagnosis not present

## 2021-02-04 DIAGNOSIS — M791 Myalgia, unspecified site: Secondary | ICD-10-CM

## 2021-02-04 NOTE — Patient Instructions (Signed)
Medication Instructions:  Your physician recommends that you continue on your current medications as directed. Please refer to the Current Medication list given to you today.  *If you need a refill on your cardiac medications before your next appointment, please call your pharmacy*   Lab Work: FASTING lab work to check cholesterol before your next visit in 1 year   If you have labs (blood work) drawn today and your tests are completely normal, you will receive your results only by: [place] (if you have MyChart) OR A paper copy in the mail If you have any lab test that is abnormal or we need to change your treatment, we will call you to review the results.  Follow-Up: At [facility], you and your health needs are our priority.  As part of our continuing mission to provide you with exceptional heart care, we have created designated Provider Care Teams.  These Care Teams include your primary Cardiologist (physician) and Advanced Practice Providers (APPs -  Physician Assistants and Nurse Practitioners) who all work together to provide you with the care you need, when you need it.  We recommend signing up for the patient portal called "MyChart".  Sign up information is provided on this After Visit Summary.  MyChart is used to connect with patients for Virtual Visits (Telemedicine).  Patients are able to view lab/test results, encounter notes, upcoming appointments, etc.  Non-urgent messages can be sent to your provider as well.   To learn more about what you can do with MyChart, go to [email or web address].    Your next appointment:   12 month(s) - [facility]  The format for your next appointment:   In Person  Provider:   [name] MD

## 2021-02-04 NOTE — Progress Notes (Signed)
LIPID CLINIC CONSULT NOTE  Chief Complaint:  Manage dyslipidemia  Primary Care Physician: [name], [name], MD  Primary Cardiologist:  None  HPI:  Vanessa Price is a 65 y.o. female who is being seen today for the evaluation of dyslipidemia at the request of [name], [place] [facility]*.  This is a pleasant 65 year old female kindly referred by Dr. Jerilee Hoh for evaluation and management of dyslipidemia, specifically elevated triglycerides.  Most recently lipid profile showed total cholesterol of 249, triglycerides 293 (as high as 774 in the past), HDL 39, and LDL of 174.  Unfortunately, she has been intolerant to statins, having failed multiple statins in the past over 20+ years of therapy.  She is currently on fenofibrate which has resulted in marked improvement in her triglycerides however again her LDL cholesterol remains quite high.  There is no known coronary disease.  There is a family history of heart disease.  10/12/2019  Vanessa Price returns today for follow-up.  She had coronary calcium scoring which was 0 although has other risk factors including hypertension, obesity, prediabetes and at least intermediate cardiovascular risk factors.  I recommended a target LDL less than 100.  She was started on bempedoic acid (Nexletol) due to the fact that she did not tolerate multiple statins due to myalgia as well as ezetimibe.  She has had some reduction in her cholesterol however not significant.  Total cholesterol is now 2 and 37, triglycerides 279, HDL 32 and LDL 153 (down from 177).  Since she is not able to reach target, I feel that we will need additional therapy.  She is also on fenofibrate 145 mg daily.  08/01/2020  Vanessa Price is seen today in follow-up.  Her cholesterol has come down substantially with the addition of Repatha.  Direct LDL is now 41, total cholesterol 111, triglycerides remain elevated at 320 and HDL low at 38.  She reports that the  bempedoic acid is quite expensive.  02/04/2021  Vanessa Price returns today for follow-up.  Her cholesterol has come down nicely on combination of Repatha and Lovaza.  She is no longer taking fenofibrate.  Total cholesterol 147, HDL 44, triglycerides 162 and LDL 75.  Overall she seems to be tolerating the medicine well.  She was concerned about recently having some hair loss.  This appears to be more in a female pattern hair loss and I told her that its not a side effect of Repatha.  PMHx:  Past Medical History:  Diagnosis Date   ANXIETY 06/04/2009   GERD 08/14/2008   occasional   Hx of adenomatous polyp of colon 03/11/2018   HYPERLIPIDEMIA 09/14/2006   HYPERTENSION 09/14/2006   Impaired glucose tolerance    MYOCARDIAL PERFUSION SCAN, WITH STRESS TEST, ABNORMAL 10/19/2008   PREMATURE VENTRICULAR CONTRACTIONS 08/14/2008    Past Surgical History:  Procedure Laterality Date   ABDOMINAL HYSTERECTOMY  1999   menorrhagia/partial   CESAREAN SECTION     2 times    FAMHx:  Family History  Problem Relation Age of Onset   Arthritis Mother    Diabetes Mother    Heart disease Mother    Mental illness Father    Cancer Brother        Lung    SOCHx:   reports that she quit smoking about 27 years ago. Her smoking use included cigarettes. She has never used smokeless tobacco. She reports that she does not currently use alcohol. She reports that she does not use drugs.  ALLERGIES:  Allergies  Allergen Reactions   Lisinopril     REACTION: cough   Risperidone     REACTION: unsure of dose; pt describes reaction of bad cough    ROS: Pertinent items noted in HPI and remainder of comprehensive ROS otherwise negative.  HOME MEDS: Current Outpatient Medications on File Prior to Visit  Medication Sig Dispense Refill   Evolocumab (REPATHA SURECLICK) 354 MG/ML SOAJ INJECT 1 DOSE INTO THE SKIN EVERY 14 DAYS 2 mL 11   losartan-hydrochlorothiazide (HYZAAR) 50-12.5 MG tablet Take 1 tablet by  mouth daily. 90 tablet 1   Multiple Vitamins-Minerals (ONE DAILY WOMENS 50 PLUS) TABS Take by mouth. Centrum Womens 50 plus MVI     naproxen sodium (ALEVE) 220 MG tablet Take 220 mg by mouth as needed.      omega-3 acid ethyl esters (LOVAZA) 1 g capsule Take 2 capsules (2 g total) by mouth 2 (two) times daily. 360 capsule 3   fenofibrate (TRICOR) 145 MG tablet TAKE 1 TABLET BY MOUTH  DAILY (Patient not taking: Reported on 02/04/2021) 90 tablet 1   No current facility-administered medications on file prior to visit.    LABS/IMAGING: No results found for this or any previous visit (from the past 48 hour(s)). No results found.  LIPID PANEL:    Component Value Date/Time   CHOL 147 01/28/2021 0947   TRIG 162 (H) 01/28/2021 0947   TRIG 142 03/03/2006 0906   HDL 44 01/28/2021 0947   CHOLHDL 3.3 01/28/2021 0947   CHOLHDL 3 08/31/2020 0805   VLDL 28.2 08/31/2020 0805   LDLCALC 75 01/28/2021 0947   LDLDIRECT 41.0 05/31/2020 0854    WEIGHTS: Wt Readings from Last 3 Encounters:  02/04/21 200 lb 12.8 oz (91.1 kg)  01/17/21 203 lb (92.1 kg)  12/27/20 201 lb 1.6 oz (91.2 kg)    VITALS: BP 136/82   Pulse 86   Ht 5\' 4"  (1.626 m)   Wt 200 lb 12.8 oz (91.1 kg)   SpO2 99%   BMI 34.47 kg/m   EXAM: Deferred  EKG: Deferred  ASSESSMENT: Mixed dyslipidemia, possible familial combined hyperlipidemia ([place]) Intermediate 10 year risk (goal LDL <100) Hypertension Statin and zetia intolerance-myalgias  PLAN: 1.   Vanessa Price continues to do well on combination therapy of Repatha and Lovaza.  Her LDL cholesterol is 75 and triglycerides are near normal.  This is a marked improvement from her LDL cholesterol of 153 about a year ago.  Would recommend we continue her current therapies, advised continue low-fat diet, weight loss and physical activity.  Follow-up with me annually or sooner as necessary.  Pixie Casino, MD, [facility], [place] Director  of the [facility] Lipid Disorders &  Cardiovascular Risk Reduction Clinic Diplomate of the American Board of Clinical Lipidology Attending Cardiologist  Direct Dial: [phone]  Fax: [phone]  Website:  [email or web address] 02/04/2021, 8:30 AM

## 2021-02-06 ENCOUNTER — Ambulatory Visit (INDEPENDENT_AMBULATORY_CARE_PROVIDER_SITE_OTHER): Payer: Medicare Other | Admitting: Neurology

## 2021-02-06 DIAGNOSIS — G4733 Obstructive sleep apnea (adult) (pediatric): Secondary | ICD-10-CM

## 2021-02-06 DIAGNOSIS — R0683 Snoring: Secondary | ICD-10-CM

## 2021-02-06 DIAGNOSIS — E669 Obesity, unspecified: Secondary | ICD-10-CM

## 2021-02-07 NOTE — Progress Notes (Signed)
[facility] TEST REPORT ( by Watch PAT)   STUDY DATE: data load 02-07-2021   DOB: [birth year]      ORDERING CLINICIAN: Larey Seat, MD  REFERRING CLINICIAN:    CLINICAL INFORMATION/HISTORY: Vanessa Price is a 65 y.o. African -American female patient  who was seen upon referral on 01/17/2021. Chief concern according to patient :  " My daughter has noted that I am snoring louder, but I have snored for many years. My weight is stabile, but I am overweight. This summer I became short of breath, even at rest, and while in supine".  Charika Pellecchia   has a past medical history of ANXIETY (06/04/2009), GERD (08/14/2008), adenomatous polyp of colon (03/11/2018), HYPERLIPIDEMIA (09/14/2006), HYPERTENSION (09/14/2006), Impaired glucose tolerance, MYOCARDIAL PERFUSION SCAN, WITH STRESS TEST, ABNORMAL (10/19/2008), and PREMATURE VENTRICULAR CONTRACTIONS (08/14/2008).Obesity, BMI 34.      Epworth sleepiness score:1 /24.   BMI: 34.6 kg/m   Neck Circumference: 17"   FINDINGS:   Sleep Summary:   Total Recording Time (hours, min): 10 hours and 15 minutes of which the total sleep time was calculated at 9 hours and 46 minutes.       Percent REM (%): 38.9%. !!!                                       Respiratory Indices:   Calculated pAHI (per hour): The calculated apnea-hypopnea index was 26.5/h in rem sleep 30.1/h in non-REM sleep 24.2/h.  Positional apnea-hypopnea index was 31.1/h in supine, and 17.4/h in nonsupine position.  Snoring level reached a mean volume of 43 dB, snoring was present for 54% of the total sleep time and at times reached over 60 dB.                                                                  Oxygen Saturation Statistics:        O2 Saturation Range (%):   Oxygen saturation ranged between 83% at nadir and a maximum of 99% with a mean value of 93% .                                   O2 Saturation (minutes)  <89%:    3.2 minutes       Pulse Rate Statistics:    Pulse Range:    Heart rate varied between 60 and 107 bpm with a mean HR  of 77 bpm             IMPRESSION:  This HST confirms the presence of moderate to severe sleep apnea not affiliated with significant hypoxia, but face loud snoring.  Sleep architecture was remarkable for the high proportion of REM sleep I would like to add that REM sleep was already noted with an about 30 minutes of sleep onset which is unusually short.  Sleep was not fragmented the patient slept through the night.   RECOMMENDATION: This degree of apnea needs to be treated and since the body mass index exceeds the values for inspire  qualification I would recommend CPAP titration with an auto CPAP device of 5 through 15 cm water pressure to centimeter EPR, heated humidification for the device and the tubing.  The patient should choose the interface while in reclined position.   Advice to avoid supine sleep is also important and continued effort to loose weight is encouraged.    INTERPRETING PHYSICIAN:   Larey Seat, MD   Medical Director of [facility] at [facility].

## 2021-02-19 ENCOUNTER — Telehealth: Payer: Self-pay | Admitting: Neurology

## 2021-02-19 NOTE — Telephone Encounter (Signed)
Pt called asking about her sleep study results. Pt requesting a call back.

## 2021-02-19 NOTE — Telephone Encounter (Signed)
Pt calling for sleep study results  

## 2021-02-25 DIAGNOSIS — G4733 Obstructive sleep apnea (adult) (pediatric): Secondary | ICD-10-CM | POA: Insufficient documentation

## 2021-02-25 NOTE — Addendum Note (Signed)
Addended by: Larey Seat on: 02/25/2021 06:15 PM   Modules accepted: Orders

## 2021-02-25 NOTE — Progress Notes (Signed)
IMPRESSION:  This HST confirms the presence of moderate to severe sleep apnea not affiliated with significant hypoxia, but with loud snoring.   Sleep architecture was remarkable for the high proportion of REM sleep. (I would like to add that REM sleep was already noted within about 30 minutes of sleep onset which is unusually short.  Sleep was not fragmented the patient slept through the night.)  RECOMMENDATION: This degree of apnea needs to be treated and since the body mass index exceeds the guidelines for inspire qualification I would recommend CPAP titration with an auto CPAP device of 5 through 15 cm water pressure , two centimeter EPR, heated humidification for the device and the tubing.  The patient should choose the interface while in reclined position.   Advice to avoid supine sleep is also important and continued effort to loose weight is encouraged.

## 2021-02-25 NOTE — Procedures (Signed)
[facility] [place] TEST REPORT ( by Watch PAT)   STUDY DATE: data load 02-07-2021   DOB: [birth year]      ORDERING CLINICIAN: Larey Seat, MD  REFERRING CLINICIAN:    CLINICAL INFORMATION/HISTORY: Vanessa Price is a 65 y.o. African -American female patient  who was seen upon referral on 01/17/2021. Chief concern according to patient :  " My daughter has noted that I am snoring louder, but I have snored for many years. My weight is stabile, but I am overweight. This summer I became short of breath, even at rest, and while in supine".  Katalena Wrigley   has a past medical history of ANXIETY (06/04/2009), GERD (08/14/2008), adenomatous polyp of colon (03/11/2018), HYPERLIPIDEMIA (09/14/2006), HYPERTENSION (09/14/2006), Impaired glucose tolerance, MYOCARDIAL PERFUSION SCAN, WITH STRESS TEST, ABNORMAL (10/19/2008), and PREMATURE VENTRICULAR CONTRACTIONS (08/14/2008).Obesity, BMI 34.      Epworth sleepiness score:1 /24.   BMI: 34.6 kg/m   Neck Circumference: 17"   FINDINGS:   Sleep Summary:   Total Recording Time (hours, min): 10 hours and 15 minutes of which the total sleep time was calculated at 9 hours and 46 minutes.       Percent REM (%): 38.9%. !!!                                       Respiratory Indices:   Calculated pAHI (per hour): The calculated apnea-hypopnea index was 26.5/h in rem sleep 30.1/h in non-REM sleep 24.2/h.  Positional apnea-hypopnea index was 31.1/h in supine, and 17.4/h in nonsupine position.  Snoring level reached a mean volume of 43 dB, snoring was present for 54% of the total sleep time and at times reached over 60 dB.                                                                  Oxygen Saturation Statistics:        O2 Saturation Range (%):   Oxygen saturation ranged between 83% at nadir and a maximum of 99% with a mean value of 93% .                                   O2 Saturation (minutes) <89%:    3.2 minutes        Pulse Rate Statistics:    Pulse Range:    Heart rate varied between 60 and 107 bpm with a mean HR  of 77 bpm             IMPRESSION:  This HST confirms the presence of moderate to severe sleep apnea not affiliated with significant hypoxia, but face loud snoring.  Sleep architecture was remarkable for the high proportion of REM sleep I would like to add that REM sleep was already noted with an about 30 minutes of sleep onset which is unusually short.  Sleep was not fragmented the patient slept through the night.   RECOMMENDATION: This degree of apnea needs to be treated and since the body mass index exceeds the values for inspire qualification I would recommend CPAP titration with an auto  CPAP device of 5 through 15 cm water pressure to centimeter EPR, heated humidification for the device and the tubing.  The patient should choose the interface while in reclined position.   Advice to avoid supine sleep is also important and continued effort to loose weight is encouraged.    INTERPRETING PHYSICIAN:   Larey Seat, MD   Medical Director of [facility] at [facility].

## 2021-02-26 ENCOUNTER — Telehealth: Payer: Self-pay | Admitting: Neurology

## 2021-02-26 NOTE — Telephone Encounter (Signed)
I called pt. I advised pt that Dr. Brett Fairy reviewed their sleep study results and found that pt has sleep apnea. Dr. Brett Fairy recommends that pt starts auto CPAP. I reviewed PAP compliance expectations with the pt. Pt is agreeable to starting a CPAP. I advised pt that an order will be sent to a DME, [facility], and [facility] will call the pt within about one week after they file with the pt's insurance. [facility] will show the pt how to use the machine, fit for masks, and troubleshoot the CPAP if needed. A follow up appt was made for insurance purposes with Dr. Brett Fairy on May 30, 2021 at 9:30 am . Pt verbalized understanding to arrive 15 minutes early and bring their CPAP. A letter with all of this information in it will be mailed to the pt as a reminder. I verified with the pt that the address we have on file is correct. Pt verbalized understanding of results. Pt had no questions at this time but was encouraged to call back if questions arise. I have sent the order to [facility] and have received confirmation that they have received the order.

## 2021-02-26 NOTE — Telephone Encounter (Signed)
-----   Message from Larey Seat, MD sent at 02/25/2021  6:15 PM EST ----- IMPRESSION:  This HST confirms the presence of moderate to severe sleep apnea not affiliated with significant hypoxia, but with loud snoring.   Sleep architecture was remarkable for the high proportion of REM sleep. (I would like to add that REM sleep was already noted within about 30 minutes of sleep onset which is unusually short.  Sleep was not fragmented the patient slept through the night.)  RECOMMENDATION: This degree of apnea needs to be treated and since the body mass index exceeds the guidelines for inspire qualification I would recommend CPAP titration with an auto CPAP device of 5 through 15 cm water pressure , two centimeter EPR, heated humidification for the device and the tubing.  The patient should choose the interface while in reclined position.   Advice to avoid supine sleep is also important and continued effort to loose weight is encouraged.

## 2021-03-14 ENCOUNTER — Encounter: Payer: Self-pay | Admitting: Internal Medicine

## 2021-03-20 ENCOUNTER — Telehealth: Payer: Self-pay

## 2021-03-20 NOTE — Telephone Encounter (Signed)
Patient Approved for Repatha, [id number], through 03/23/2022

## 2021-03-20 NOTE — Telephone Encounter (Signed)
PA for Repatha submitted via [place], [place] [id number]

## 2021-03-28 ENCOUNTER — Telehealth: Payer: Self-pay | Admitting: Pharmacist

## 2021-03-28 NOTE — Chronic Care Management (AMB) (Signed)
Chronic Care Management Pharmacy Assistant   Name: Vanessa Price  MRN: [id number] DOB: 10/07/1955  Reason for Encounter: Disease State / Hypertension Assessment Call   Conditions to be addressed/monitored: HTN  Recent office visits:  None  Recent consult visits:  02/04/2021 [name] MD (cardiology) - Patient was seen for dyslipidemia and additional issues. No medication changes. Follow up in 12 months.  01/17/2021 Asencion Partridge Dohmeier MD (neurology) - Patient was seen for snoring and an additional issue. Discontinued Dicyclomine. No follow up noted.  Hospital visits:  None  Medications: Outpatient Encounter Medications as of 03/28/2021  Medication Sig   Evolocumab (REPATHA SURECLICK) 259 MG/ML SOAJ INJECT 1 DOSE INTO THE SKIN EVERY 14 DAYS   fenofibrate (TRICOR) 145 MG tablet TAKE 1 TABLET BY MOUTH  DAILY (Patient not taking: Reported on 02/04/2021)   losartan-hydrochlorothiazide (HYZAAR) 50-12.5 MG tablet Take 1 tablet by mouth daily.   Multiple Vitamins-Minerals (ONE DAILY WOMENS 50 PLUS) TABS Take by mouth. Centrum Womens 50 plus MVI   naproxen sodium (ALEVE) 220 MG tablet Take 220 mg by mouth as needed.    omega-3 acid ethyl esters (LOVAZA) 1 g capsule Take 2 capsules (2 g total) by mouth 2 (two) times daily.   No facility-administered encounter medications on file as of 03/28/2021.  Fill History: REPATHA SURE 140MG /ML PEN INJ 03/16/2021 28   FENOFIBRATE  145 MG TABS 09/12/2020 90   LOSARTAN POTASSIUM/HYDROCHLOROTHIAZ IDE 50-12.5 MG TABS 01/26/2021 90   OMEGA-3-ACID ETHYL ESTERS  1 GM CAPS 03/05/2021 90   Reviewed chart prior to disease state call. Spoke with patient regarding BP  Recent Office Vitals: BP Readings from Last 3 Encounters:  02/04/21 136/82  01/17/21 (!) 159/93  12/27/20 120/70   Pulse Readings from Last 3 Encounters:  02/04/21 86  01/17/21 87  12/27/20 81    Wt Readings from Last 3 Encounters:  02/04/21 200 lb 12.8 oz (91.1 kg)   01/17/21 203 lb (92.1 kg)  12/27/20 201 lb 1.6 oz (91.2 kg)     Kidney Function Lab Results  Component Value Date/Time   CREATININE 0.81 12/17/2020 11:36 AM   CREATININE 0.94 08/31/2020 08:05 AM   GFR 63.09 08/31/2020 08:05 AM   GFRNONAA >60 12/17/2020 11:36 AM   GFRAA 96 01/04/2008 12:00 AM    BMP Latest Ref Rng & Units 12/17/2020 08/31/2020 05/31/2020  Glucose 70 - 99 mg/dL 102(H) 100(H) 99  BUN 8 - 23 mg/dL 13 20 18   Creatinine 0.44 - 1.00 mg/dL 0.81 0.94 0.92  Sodium 135 - 145 mmol/L 137 144 143  Potassium 3.5 - 5.1 mmol/L 4.0 3.9 3.9  Chloride 98 - 111 mmol/L 106 108 106  CO2 22 - 32 mmol/L 23 27 27   Calcium 8.9 - 10.3 mg/dL 9.4 9.8 9.8    Current antihypertensive regimen:  Hyzaar 50/12.5 mg - take 1 tablet daily  How often are you checking your Blood Pressure? Patient is not currently checking blood pressures, she will do better going forward.  Current home BP readings: Not currently checking blood pressures.  What recent interventions/DTPs have been made by any provider to improve Blood Pressure control since last CPP Visit: No new interventions.   Any recent hospitalizations or ED visits since last visit with CPP? No recent hospital visits.  What diet changes have been made to improve Blood Pressure Control?  Patient eats two meals daily, breakfast is generally grits, oatmeal or cereal. She will have an early dinner which always includes a meat and vegetable  What exercise is being done to improve your Blood Pressure Control?  Patient walks when the weather is nice, does yard work and house work.  Adherence Review: Is the patient currently on ACE/ARB medication? Yes Does the patient have >5 day gap between last estimated fill dates? No  Care Gaps: AWV - message sent to [name] CMA to schedule. Pneumovax - never done Covid vaccine - overdue Last BP - 136/82 on 02/04/2021  Star Rating Drugs: Losartan HCTZ 50/12.5 mg - last filled 01/26/2021 90 DS at  [place] Pharmacist Assistant [phone]

## 2021-03-29 ENCOUNTER — Other Ambulatory Visit: Payer: Self-pay | Admitting: Internal Medicine

## 2021-04-02 DIAGNOSIS — G4733 Obstructive sleep apnea (adult) (pediatric): Secondary | ICD-10-CM | POA: Diagnosis not present

## 2021-04-15 ENCOUNTER — Ambulatory Visit (INDEPENDENT_AMBULATORY_CARE_PROVIDER_SITE_OTHER): Payer: Medicare Other | Admitting: Internal Medicine

## 2021-04-15 VITALS — BP 168/100 | HR 86 | Temp 98.1°F | Wt 200.0 lb

## 2021-04-15 DIAGNOSIS — G4733 Obstructive sleep apnea (adult) (pediatric): Secondary | ICD-10-CM

## 2021-04-15 DIAGNOSIS — I10 Essential (primary) hypertension: Secondary | ICD-10-CM

## 2021-04-15 DIAGNOSIS — I700 Atherosclerosis of aorta: Secondary | ICD-10-CM

## 2021-04-15 DIAGNOSIS — E785 Hyperlipidemia, unspecified: Secondary | ICD-10-CM | POA: Diagnosis not present

## 2021-04-15 DIAGNOSIS — G8929 Other chronic pain: Secondary | ICD-10-CM | POA: Diagnosis not present

## 2021-04-15 DIAGNOSIS — M5442 Lumbago with sciatica, left side: Secondary | ICD-10-CM

## 2021-04-15 DIAGNOSIS — R7302 Impaired glucose tolerance (oral): Secondary | ICD-10-CM | POA: Diagnosis not present

## 2021-04-15 LAB — POCT GLYCOSYLATED HEMOGLOBIN (HGB A1C): Hemoglobin A1C: 6.1 % — AB (ref 4.0–5.6)

## 2021-04-15 NOTE — Progress Notes (Signed)
Established Patient Office Visit     This visit occurred during the SARS-CoV-2 public health emergency.  Safety protocols were in place, including screening questions prior to the visit, additional usage of staff PPE, and extensive cleaning of exam room while observing appropriate contact time as indicated for disinfecting solutions.    CC/Reason for Visit: 110-month follow-up chronic medical conditions  HPI: Vanessa Price is a 66 y.o. female who is coming in today for the above mentioned reasons. Past Medical History is significant for: Hyperlipidemia, impaired glucose tolerance, hypertension, obesity.  She was recently diagnosed with obstructive sleep apnea and is using nightly CPAP.  Her blood pressure is noted to be elevated to 168/100, has been normal on previous visits.  She has not been routinely checking at home.  She has chronic left-sided low back pain with sciatica.  Lately it has progressed to numbness around her leg.  She is interested in physical therapy.   Past Medical/Surgical History: Past Medical History:  Diagnosis Date   ANXIETY 06/04/2009   GERD 08/14/2008   occasional   Hx of adenomatous polyp of colon 03/11/2018   HYPERLIPIDEMIA 09/14/2006   HYPERTENSION 09/14/2006   Impaired glucose tolerance    MYOCARDIAL PERFUSION SCAN, WITH STRESS TEST, ABNORMAL 10/19/2008   PREMATURE VENTRICULAR CONTRACTIONS 08/14/2008    Past Surgical History:  Procedure Laterality Date   ABDOMINAL HYSTERECTOMY  1999   menorrhagia/partial   CESAREAN SECTION     2 times    Social History:  reports that she quit smoking about 28 years ago. Her smoking use included cigarettes. She has never used smokeless tobacco. She reports that she does not currently use alcohol. She reports that she does not use drugs.  Allergies: Allergies  Allergen Reactions   Lisinopril     REACTION: cough   Risperidone     REACTION: unsure of dose; pt describes reaction of bad cough     Family History:  Family History  Problem Relation Age of Onset   Arthritis Mother    Diabetes Mother    Heart disease Mother    Mental illness Father    Cancer Brother        Lung     Current Outpatient Medications:    Evolocumab (REPATHA SURECLICK) 025 MG/ML SOAJ, INJECT 1 DOSE INTO THE SKIN EVERY 14 DAYS, Disp: 2 mL, Rfl: 11   losartan-hydrochlorothiazide (HYZAAR) 50-12.5 MG tablet, TAKE 1 TABLET BY MOUTH  DAILY, Disp: 90 tablet, Rfl: 3   Multiple Vitamins-Minerals (ONE DAILY WOMENS 50 PLUS) TABS, Take by mouth. Centrum Womens 50 plus MVI, Disp: , Rfl:    naproxen sodium (ALEVE) 220 MG tablet, Take 220 mg by mouth as needed. , Disp: , Rfl:    omega-3 acid ethyl esters (LOVAZA) 1 g capsule, Take 2 capsules (2 g total) by mouth 2 (two) times daily., Disp: 360 capsule, Rfl: 3   fenofibrate (TRICOR) 145 MG tablet, TAKE 1 TABLET BY MOUTH  DAILY (Patient not taking: Reported on 04/15/2021), Disp: 90 tablet, Rfl: 1  Review of Systems:  Constitutional: Denies fever, chills, diaphoresis, appetite change and fatigue.  HEENT: Denies photophobia, eye pain, redness, hearing loss, ear pain, congestion, sore throat, rhinorrhea, sneezing, mouth sores, trouble swallowing, neck pain, neck stiffness and tinnitus.   Respiratory: Denies SOB, DOE, cough, chest tightness,  and wheezing.   Cardiovascular: Denies chest pain, palpitations and leg swelling.  Gastrointestinal: Denies nausea, vomiting, abdominal pain, diarrhea, constipation, blood in stool and abdominal distention.  Genitourinary: Denies  dysuria, urgency, frequency, hematuria, flank pain and difficulty urinating.  Endocrine: Denies: hot or cold intolerance, sweats, changes in hair or nails, polyuria, polydipsia. Musculoskeletal: Denies myalgias. Skin: Denies pallor, rash and wound.  Neurological: Denies dizziness, seizures, syncope,  light-headedness, numbness and headaches.  Hematological: Denies adenopathy. Easy bruising, personal or  family bleeding history  Psychiatric/Behavioral: Denies suicidal ideation, mood changes, confusion, nervousness, sleep disturbance and agitation    Physical Exam: Vitals:   04/15/21 0826  BP: (!) 168/100  Pulse: 86  Temp: 98.1 F (36.7 C)  TempSrc: Oral  SpO2: 99%  Weight: 200 lb (90.7 kg)    Body mass index is 34.33 kg/m.   Constitutional: NAD, calm, comfortable Eyes: PERRL, lids and conjunctivae normal ENMT: Mucous membranes are moist.  Respiratory: clear to auscultation bilaterally, no wheezing, no crackles. Normal respiratory effort. No accessory muscle use.  Cardiovascular: Regular rate and rhythm, no murmurs / rubs / gallops. No extremity edema.  Psychiatric: Normal judgment and insight. Alert and oriented x 3. Normal mood.    Impression and Plan:  Impaired glucose tolerance  - Plan: POCT HgB A1C -A1c remains in the prediabetic range at 6.1 today, lifestyle changes enforced.  Dyslipidemia -Last lipid panel in November 2022 with a total cholesterol of 147, triglycerides 162 and LDL 75.  She remains on omega-3 fatty acids and evolocumab.  Essential hypertension -Uncontrolled, she has not been checking at home. -She will do ambulatory blood pressure monitoring and return in 3 months for follow-up.  Sooner if pressure remains elevated.  OSA (obstructive sleep apnea) -Now on CPAP therapy.  Atherosclerosis of aorta ([place]) -Noted, followed by lipid clinic on evolocumab and omega-3 fatty acids.  Chronic left-sided low back pain with left-sided sciatica  - Plan: Ambulatory referral to Physical Therapy -Consider MRI if no improvement in 8 to 12 weeks.  Time spent: 32 minutes reviewing chart, interviewing and examining patient and formulating plan of care.   Patient Instructions  -Nice seeing you today!!  -Lab work today; will notify you once results are available.  -Make sure you are checking your BP at home at least 3 times a week. Bring measurements in to your  next visit in 3 months.    [name], MD [facility]

## 2021-04-15 NOTE — Patient Instructions (Signed)
-  Nice seeing you today!!  -Lab work today; will notify you once results are available.  -Make sure you are checking your BP at home at least 3 times a week. Bring measurements in to your next visit in 3 months.

## 2021-04-16 ENCOUNTER — Other Ambulatory Visit: Payer: Self-pay

## 2021-04-16 ENCOUNTER — Encounter: Payer: Self-pay | Admitting: Rehabilitative and Restorative Service Providers"

## 2021-04-16 ENCOUNTER — Ambulatory Visit: Payer: Medicare Other | Attending: Internal Medicine | Admitting: Rehabilitative and Restorative Service Providers"

## 2021-04-16 DIAGNOSIS — M5442 Lumbago with sciatica, left side: Secondary | ICD-10-CM | POA: Diagnosis not present

## 2021-04-16 DIAGNOSIS — G8929 Other chronic pain: Secondary | ICD-10-CM | POA: Diagnosis not present

## 2021-04-16 DIAGNOSIS — I10 Essential (primary) hypertension: Secondary | ICD-10-CM | POA: Insufficient documentation

## 2021-04-16 DIAGNOSIS — R262 Difficulty in walking, not elsewhere classified: Secondary | ICD-10-CM

## 2021-04-16 DIAGNOSIS — G4733 Obstructive sleep apnea (adult) (pediatric): Secondary | ICD-10-CM | POA: Insufficient documentation

## 2021-04-16 DIAGNOSIS — R252 Cramp and spasm: Secondary | ICD-10-CM

## 2021-04-16 DIAGNOSIS — M6281 Muscle weakness (generalized): Secondary | ICD-10-CM

## 2021-04-16 DIAGNOSIS — R7302 Impaired glucose tolerance (oral): Secondary | ICD-10-CM | POA: Insufficient documentation

## 2021-04-16 DIAGNOSIS — Z9989 Dependence on other enabling machines and devices: Secondary | ICD-10-CM | POA: Insufficient documentation

## 2021-04-16 DIAGNOSIS — E785 Hyperlipidemia, unspecified: Secondary | ICD-10-CM | POA: Diagnosis not present

## 2021-04-16 NOTE — Patient Instructions (Addendum)
Access Code: [email or web address] URL: [email or web address] Date: 04/16/2021 Prepared by: Shelby Dubin Uri Turnbough  Exercises Seated Hamstring Stretch - 1-2 x daily - 7 x weekly - 1 sets - 2 reps - 20 sec hold Standing Hamstring Stretch with Step - 1-2 x daily - 7 x weekly - 1 sets - 2 reps - 20 sec hold Hip Flexor Stretch on Step - 1-2 x daily - 7 x weekly - 1 sets - 2 reps - 15 sec hold Seated Piriformis Stretch - 1-2 x daily - 7 x weekly - 1 sets - 2 reps - 20 sec hold Seated Sciatic Tensioner - 1-2 x daily - 7 x weekly - 1 sets - 3 reps - 10 sec hold      [facility] Welcome to [place]! Here you will find all the information you will need regarding your pool therapy. If you have further questions at any time, please call our office at [phone]. After completing your initial evaluation in the [facility], you may be eligible to complete a portion of your therapy in the pool. A typical week of therapy will consist of 1-2 typical physical therapy visits at our [place] location and an additional session of therapy in the pool located at the [facility] at [facility]. [address] [place]. The phone number at the pool site is [phone]. Please call this number if you are running late or need to cancel your appointment.  Aquatic therapy will be offered on Wednesday mornings and Friday afternoons. Each session will last approximately 45 minutes. All scheduling and payments for aquatic therapy sessions, including cancelations, will be done through our [place] location.  To be eligible for aquatic therapy, these criteria must be met: You must be able to independently change in the locker room and get to the pool deck. A caregiver can come with you to help if needed. There are benches for a caregiver to sit on next to the pool. No one with an open wound is permitted in the pool.  Handicap parking is available in  the front and there is a drop off option for even closer accessibility. Please arrive 15 minutes prior to your appointment to prepare for your pool session. You must sign in at the front desk upon your arrival. Please be sure to attend to any toileting needs prior to entering the pool. Gisela rooms for changing are available.  There is direct access to the pool deck from the locker room. You can lock your belongings in a locker or bring them with you poolside. Your therapist will greet you on the pool deck. There may be other swimmers in the pool at the same time but your session is one-on-one with the therapist.

## 2021-04-16 NOTE — Therapy (Signed)
[facility] @ [address] [place], [place], [address] Phone: [phone]   Fax:  [phone]  Physical Therapy Evaluation  Patient Details  Name: Vanessa Price MRN: [id number] Date of Birth: December 22, 1955 Referring Provider (PT): Dr Erline Hau   Encounter Date: 04/16/2021   PT End of Session - 04/16/21 1541     Visit Number 1    Date for PT Re-Evaluation 06/07/21    Authorization Type [employer] Medicare    Progress Note Due on Visit 10    PT Start Time 1400    PT Stop Time 1440    PT Time Calculation (min) 40 min    Activity Tolerance Patient tolerated treatment well    Behavior During Therapy [facility] for tasks assessed/performed             Past Medical History:  Diagnosis Date   ANXIETY 06/04/2009   GERD 08/14/2008   occasional   Hx of adenomatous polyp of colon 03/11/2018   HYPERLIPIDEMIA 09/14/2006   HYPERTENSION 09/14/2006   Impaired glucose tolerance    MYOCARDIAL PERFUSION SCAN, WITH STRESS TEST, ABNORMAL 10/19/2008   PREMATURE VENTRICULAR CONTRACTIONS 08/14/2008    Past Surgical History:  Procedure Laterality Date   ABDOMINAL HYSTERECTOMY  1999   menorrhagia/partial   CESAREAN SECTION     2 times    There were no vitals filed for this visit.    Subjective Assessment - 04/16/21 1403     Subjective Pt reports that she has had back pain for years, but reports that the pain got worse and started shooting down her left leg more recently.  Pt reports pain worse when she is navigating steps. Pt reports that she is starting to stumble more now, denies falls.    Pertinent History Hyperlipidemia, impaired glucose tolerance, hypertension, obesity.  She was recently diagnosed with obstructive sleep apnea and is using nightly CPAP.    Limitations Walking    How long can you sit comfortably? 30 min    How long can you stand comfortably? 1 hour    How long can you walk comfortably? 30 min     Diagnostic tests radiographs with Dr [name], pt reports revealed OA of back    Patient Stated Goals Pt would ike to be able to get up and walk without pain and numbness.    Currently in Pain? Yes    Pain Score 7     Pain Location Back    Pain Orientation Left;Lower    Pain Descriptors / Indicators Radiating;Tightness;Numbness    Pain Type Chronic pain    Pain Radiating Towards down left leg    Pain Onset More than a month ago    Pain Frequency Constant    Aggravating Factors  stairs, lying flat and being still    Pain Relieving Factors walking on flat surfaces                [facility] PT Assessment - 04/16/21 0001       Assessment   Medical Diagnosis M54.42,G89.29 (ICD-10-CM) - Chronic left-sided low back pain with left-sided sciatica    Referring Provider (PT) Dr Erline Hau    Hand Dominance Right    Next MD Visit as needed    Prior Therapy no      Precautions   Precautions None      Restrictions   Weight Bearing Restrictions No      Balance Screen   Has the  patient fallen in the past 6 months No    Has the patient had a decrease in activity level because of a fear of falling?  No    Is the patient reluctant to leave their home because of a fear of falling?  No      Home Environment   Living Environment Private residence    Living Arrangements Children   adult child   Type of [place] to enter    Home Layout Two level;Bed/bath upstairs    [place] None      Prior Function   Level of Independence Independent    Vocation Retired    Leisure traveling      Cognition   Overall Cognitive Status Within Functional Limits for tasks assessed      Observation/Other Assessments   Focus on Therapeutic Outcomes (FOTO)  57%   Projected 63% by visit 11.     ROM / Strength   AROM / PROM / Strength Strength      Strength   Overall Strength Comments L hip strength of 4-/5, L knee 4/5.      Flexibility   Soft Tissue  Assessment /Muscle Length yes    Hamstrings tight bilateral, L > R    Piriformis tight bilateral      Transfers   Five time sit to stand comments  17.8 sec with limited UE use required.      6 minute walk test results    Aerobic Endurance Distance Walked 984                        Objective measurements completed on examination: See above findings.                PT Education - 04/16/21 1443     Education Details Pt provided with HEP    Person(s) Educated Patient    Methods Explanation;Demonstration;Handout    Comprehension Verbalized understanding;Returned demonstration              PT Short Term Goals - 04/16/21 1551       PT SHORT TERM GOAL #1   Title Pt will be indepenent with initial HEP.    Time 2    Period Weeks    Status New               PT Long Term Goals - 04/16/21 1551       PT LONG TERM GOAL #1   Title Pt will be independent with advanced HEP.    Time 8    Period Weeks    Status New      PT LONG TERM GOAL #2   Title Pt will increase FOTO to at least 63% to demonstrate increased functional mobility.    Baseline 57%    Time 8    Period Weeks    Status New      PT LONG TERM GOAL #3   Title Pt will increase L hip and knee strength to at least 4+/5 to allow her to play with her grandson.    Time 8    Period Weeks    Status New      PT LONG TERM GOAL #4   Title Pt will report being able to negotiate the stairs in her home with railing and reciprocal pattern without increased pain.    Time 8    Period Weeks    Status New  PT LONG TERM GOAL #5   Title Pt will be able to perform single leg stance for at least 10 seconds to demonstrate decreased risk of falling.    Time 8    Period Weeks    Status New                    Plan - 04/16/21 1542     Clinical Impression Statement Pt is a 66 y.o. female referred to outpatient PT with chronic left sided low back pain with left sided sciatica. Pt  reports having back pain for at least 2 years, but states that in recent months, the pain has gotten worse and she has pain radiating down her left leg. Pts PLOF is independent without assistive device and able to care and play with her 33 month old grandson and easily navigate steps to her upstairs bedroom. Pt reports recently having increased pain with navigating steps and difficulty with lifting her leg to step over the shower, as well as increased pain. Pt presents with hamstring and piriformis tightness, L leg weakness, decreased single leg balance on LLE, and difficulty with ADLs and IADLs. During 6 minute walk test, pt reports that she begins feeling increased pain after approx 1.5 minutes. Pt would benefit from skilled PT to address her functional impairments to allow her to play with her grandson, navigate steps in her home, and travel again without increased pain.    Personal Factors and Comorbidities Comorbidity 3+    Comorbidities Hyperlipidemia, impaired glucose tolerance, hypertension, OSA with CPAP    Examination-Activity Limitations Bathing;Locomotion Level;Squat;Stairs    Examination-Participation Restrictions Community Activity    Stability/Clinical Decision Making Evolving/Moderate complexity    Clinical Decision Making Moderate    Rehab Potential Good    PT Frequency 2x / week    PT Duration 8 weeks    PT Treatment/Interventions ADLs/Self Care Home Management;Aquatic Therapy;Cryotherapy;Electrical Stimulation;Iontophoresis 4mg /ml Dexamethasone;Moist Heat;Traction;Ultrasound;Gait training;Stair training;Functional mobility training;Therapeutic activities;Therapeutic exercise;Balance training;Neuromuscular re-education;Patient/family education;Manual techniques;Passive range of motion;Dry needling;Taping;Spinal Manipulations;Joint Manipulations    PT Next Visit Plan assess and progress HEP, aquatics as indicated, manual therapy as indicated, strengthening, core stability, flexibility     PT Home Exercise Plan Access Code: [email or web address]    Consulted and Agree with Plan of Care Patient             Patient will benefit from skilled therapeutic intervention in order to improve the following deficits and impairments:  Difficulty walking, Increased muscle spasms, Pain, Impaired flexibility, Decreased strength, Decreased balance  Visit Diagnosis: Difficulty in walking, not elsewhere classified - Plan: PT plan of care cert/re-cert  Muscle weakness (generalized) - Plan: PT plan of care cert/re-cert  Cramp and spasm - Plan: PT plan of care cert/re-cert     Problem List Patient Active Problem List   Diagnosis Date Noted   Moderate obstructive sleep apnea-hypopnea syndrome 02/25/2021   Obesity (BMI 30.0-34.9) 01/17/2021   Snoring 01/17/2021   Vitamin D deficiency 10/26/2018   Hx of adenomatous polyp of colon 03/11/2018   Constipation 02/10/2018   Impaired glucose tolerance 10/02/2015   ANXIETY 06/04/2009   MYOCARDIAL PERFUSION SCAN, WITH STRESS TEST, ABNORMAL 10/19/2008   PREMATURE VENTRICULAR CONTRACTIONS 08/14/2008   GERD 08/14/2008   Goiter 01/04/2008   Dyslipidemia 09/14/2006   Essential hypertension 09/14/2006    Juel Burrow, PT, DPT 04/16/2021, 3:57 PM  [facility] @ [place] [place], [place], [address] Phone: [phone]   Fax:  [phone]  Name: Vanessa Price MRN: [id number] Date of Birth: [birth year]

## 2021-04-17 ENCOUNTER — Ambulatory Visit: Payer: Medicare Other | Admitting: Physical Therapy

## 2021-04-23 ENCOUNTER — Ambulatory Visit: Payer: Medicare Other | Admitting: Physical Therapy

## 2021-04-23 ENCOUNTER — Encounter: Payer: Self-pay | Admitting: Physical Therapy

## 2021-04-23 ENCOUNTER — Other Ambulatory Visit: Payer: Self-pay

## 2021-04-23 DIAGNOSIS — R262 Difficulty in walking, not elsewhere classified: Secondary | ICD-10-CM

## 2021-04-23 DIAGNOSIS — G4733 Obstructive sleep apnea (adult) (pediatric): Secondary | ICD-10-CM | POA: Diagnosis not present

## 2021-04-23 DIAGNOSIS — Z9989 Dependence on other enabling machines and devices: Secondary | ICD-10-CM | POA: Diagnosis not present

## 2021-04-23 DIAGNOSIS — G8929 Other chronic pain: Secondary | ICD-10-CM | POA: Diagnosis not present

## 2021-04-23 DIAGNOSIS — E785 Hyperlipidemia, unspecified: Secondary | ICD-10-CM | POA: Diagnosis not present

## 2021-04-23 DIAGNOSIS — I10 Essential (primary) hypertension: Secondary | ICD-10-CM | POA: Diagnosis not present

## 2021-04-23 DIAGNOSIS — R252 Cramp and spasm: Secondary | ICD-10-CM | POA: Diagnosis not present

## 2021-04-23 DIAGNOSIS — R7302 Impaired glucose tolerance (oral): Secondary | ICD-10-CM | POA: Diagnosis not present

## 2021-04-23 DIAGNOSIS — M6281 Muscle weakness (generalized): Secondary | ICD-10-CM

## 2021-04-23 DIAGNOSIS — M5442 Lumbago with sciatica, left side: Secondary | ICD-10-CM | POA: Diagnosis not present

## 2021-04-23 NOTE — Therapy (Signed)
[facility] @ [place], [place], [address] Phone: [phone]   Fax:  [phone]  Physical Therapy Treatment  Patient Details  Name: Vanessa Price MRN: [id number] Date of Birth: [birth year] Referring Provider (PT): Dr Erline Hau   Encounter Date: 04/23/2021   PT End of Session - 04/23/21 1331     Visit Number 2    Date for PT Re-Evaluation 06/07/21    Authorization Type U[employer] Medicare    Progress Note Due on Visit 10    PT Start Time 1100    PT Stop Time 1140    PT Time Calculation (min) 40 min    Activity Tolerance Patient tolerated treatment well    Behavior During Therapy [facility] for tasks assessed/performed             Past Medical History:  Diagnosis Date   ANXIETY 06/04/2009   GERD 08/14/2008   occasional   Hx of adenomatous polyp of colon 03/11/2018   HYPERLIPIDEMIA 09/14/2006   HYPERTENSION 09/14/2006   Impaired glucose tolerance    MYOCARDIAL PERFUSION SCAN, WITH STRESS TEST, ABNORMAL 10/19/2008   PREMATURE VENTRICULAR CONTRACTIONS 08/14/2008    Past Surgical History:  Procedure Laterality Date   ABDOMINAL HYSTERECTOMY  1999   menorrhagia/partial   CESAREAN SECTION     2 times    There were no vitals filed for this visit.   Subjective Assessment - 04/23/21 1101     Subjective Pain is about a 5/10 today in back and Lt leg.  The HEP is good - helps the symptoms for a little while after doing them.    Pertinent History Hyperlipidemia, impaired glucose tolerance, hypertension, obesity.  She was recently diagnosed with obstructive sleep apnea and is using nightly CPAP.    Limitations Walking    How long can you sit comfortably? 30 min    How long can you stand comfortably? 1 hour    How long can you walk comfortably? 30 min    Diagnostic tests radiographs with Dr [name], pt reports revealed OA of back    Patient Stated Goals Pt would ike to be able to get up and  walk without pain and numbness.    Currently in Pain? Yes    Pain Score 5     Pain Location Back    Pain Orientation Left;Lower    Pain Descriptors / Indicators Tightness;Numbness;Radiating    Pain Type Chronic pain    Pain Radiating Towards Lt LE    Pain Onset More than a month ago    Pain Frequency Intermittent    Aggravating Factors  movement, stairs, lying flat, being still    Pain Relieving Factors walking on flat surfaces                               [facility] Adult PT Treatment/Exercise - 04/23/21 0001       Self-Care   Self-Care Posture    Posture alignment of trunk over pelvis vs sway back, avoid buttock gripping and find core instead      Exercises   Exercises Lumbar;Knee/Hip;Shoulder      Lumbar Exercises: Stretches   Active Hamstring Stretch Left;20 seconds;1 rep    Active Hamstring Stretch Limitations in chair, Lt LBP with this so switched to standing hamstring stretch foot on step    Single Knee to Chest Stretch 3 reps;10 seconds    Single  Knee to Chest Stretch Limitations supine    Hip Flexor Stretch Left;2 reps;20 seconds;Right    Hip Flexor Stretch Limitations foot on 2nd step    Piriformis Stretch Left;Right;3 reps;10 seconds    Figure 4 Stretch 3 reps;10 seconds;With overpressure    Figure 4 Stretch Limitations supine (cross foot over knee, push knee away)      Lumbar Exercises: Aerobic   Nustep L3 x 5' seat 9 arms 71 (old model NuStep)      Lumbar Exercises: Seated   Long Arc Quad on Chair AROM;Both;1 set;10 reps    Sit to Stand 10 reps    Sit to Stand Limitations no UE support, from chair    Other Seated Lumbar Exercises hip flexion isometric 5x5" holds Rt/Lt, TA VC      Knee/Hip Exercises: Standing   Hip Abduction AROM;Left;Right;2 sets;5 reps;Knee straight    Abduction Limitations at counter    Hip Extension AROM;Left;Right;2 sets;5 reps;Knee straight    Extension Limitations at counter      Shoulder Exercises: Standing    Extension Strengthening;Both;10 reps;Theraband    Theraband Level (Shoulder Extension) Level 2 (Red)    Extension Limitations VC sternum over pubic bone, release buttock gripping and find TA, hold 5" for posture rechecks                     PT Education - 04/23/21 1140     Education Details modified initial HEP stretches for tolerance, added supine hip stretches and LAQ and sit to stand    Person(s) Educated Patient    Methods Explanation;Demonstration;Handout    Comprehension Verbalized understanding;Returned demonstration              PT Short Term Goals - 04/23/21 1338       PT SHORT TERM GOAL #1   Title Pt will be indepenent with initial HEP.    Status On-going               PT Long Term Goals - 04/16/21 1551       PT LONG TERM GOAL #1   Title Pt will be independent with advanced HEP.    Time 8    Period Weeks    Status New      PT LONG TERM GOAL #2   Title Pt will increase FOTO to at least 63% to demonstrate increased functional mobility.    Baseline 57%    Time 8    Period Weeks    Status New      PT LONG TERM GOAL #3   Title Pt will increase L hip and knee strength to at least 4+/5 to allow her to play with her grandson.    Time 8    Period Weeks    Status New      PT LONG TERM GOAL #4   Title Pt will report being able to negotiate the stairs in her home with railing and reciprocal pattern without increased pain.    Time 8    Period Weeks    Status New      PT LONG TERM GOAL #5   Title Pt will be able to perform single leg stance for at least 10 seconds to demonstrate decreased risk of falling.    Time 8    Period Weeks    Status New                   Plan - 04/23/21 1333  Clinical Impression Statement Pt arrived for first time follow up with 5/10 LBP and Lt radicular pain.  PT reviewed initial HEP with Pt and modified hamstring stretch and piriformis stretch to avoid performing these in sitting due to increased LBP  with these.  Pt was able to tolerate all ther ex including seated and standing strengthening without exacerbation of pain.  She performed 10x sit to stand reps without UEs.  PT educated and cued Pt throughout session to aim for sternum over pubic bone posture and avoid overuse of gluteals for stabilization.  Pt was able to demo good TA with seated hip flexion isometric and with tband shoulder extension in standing.  PT progressed supine hip stretches, LAQ and sit to stand for HEP.  Pt reported 0/10 pain end of session demonstrating excellent response to stretching and ther ex.    Comorbidities Hyperlipidemia, impaired glucose tolerance, hypertension, OSA with CPAP    PT Frequency 2x / week    PT Duration 8 weeks    PT Treatment/Interventions ADLs/Self Care Home Management;Aquatic Therapy;Cryotherapy;Electrical Stimulation;Iontophoresis 4mg /ml Dexamethasone;Moist Heat;Traction;Ultrasound;Gait training;Stair training;Functional mobility training;Therapeutic activities;Therapeutic exercise;Balance training;Neuromuscular re-education;Patient/family education;Manual techniques;Passive range of motion;Dry needling;Taping;Spinal Manipulations;Joint Manipulations    PT Next Visit Plan review updated HEP, monitor postural alignment within ther ex, Pt benefits from varying standing and seated ther ex for core/LE strength    PT Home Exercise Plan Access Code: [email or web address]    Consulted and Agree with Plan of Care Patient             Patient will benefit from skilled therapeutic intervention in order to improve the following deficits and impairments:     Visit Diagnosis: Difficulty in walking, not elsewhere classified  Muscle weakness (generalized)     Problem List Patient Active Problem List   Diagnosis Date Noted   Moderate obstructive sleep apnea-hypopnea syndrome 02/25/2021   Obesity (BMI 30.0-34.9) 01/17/2021   Snoring 01/17/2021   Vitamin D deficiency 10/26/2018   Hx of adenomatous polyp of  colon 03/11/2018   Constipation 02/10/2018   Impaired glucose tolerance 10/02/2015   ANXIETY 06/04/2009   MYOCARDIAL PERFUSION SCAN, WITH STRESS TEST, ABNORMAL 10/19/2008   PREMATURE VENTRICULAR CONTRACTIONS 08/14/2008   GERD 08/14/2008   Goiter 01/04/2008   Dyslipidemia 09/14/2006   Essential hypertension 09/14/2006   Baruch Merl, PT 04/23/21 1:39 PM   [facility] @ [place], [place], [address] Phone: [phone]   Fax:  [phone]  Name: Vanessa Price MRN: [id number] Date of Birth: Apr 24, 1955

## 2021-04-23 NOTE — Patient Instructions (Signed)
Access Code: [email or web address] URL: [email or web address] Date: 04/23/2021 Prepared by: Venetia Night Noriko Macari  Exercises Standing Hamstring Stretch with Step - 1-2 x daily - 7 x weekly - 1 sets - 2 reps - 20 sec hold Hip Flexor Stretch on Step - 1-2 x daily - 7 x weekly - 1 sets - 2 reps - 15 sec hold Seated Sciatic Tensioner - 1-2 x daily - 7 x weekly - 1 sets - 3 reps - 10 sec hold Supine Figure 4 Piriformis Stretch - 1 x daily - 7 x weekly - 1 sets - 3 reps - 10 hold Hooklying Single Knee to Chest Stretch - 1 x daily - 7 x weekly - 1 sets - 3 reps - 10 hold Supine Piriformis Stretch with Foot on Ground - 1 x daily - 7 x weekly - 1 sets - 3 reps - 10 hold Seated Long Arc Quad - 1 x daily - 7 x weekly - 2 sets - 10 reps Sit to Stand - 1 x daily - 7 x weekly - 1 sets - 10 reps

## 2021-04-25 ENCOUNTER — Other Ambulatory Visit: Payer: Self-pay

## 2021-04-25 ENCOUNTER — Ambulatory Visit: Payer: Medicare Other | Attending: Internal Medicine | Admitting: Physical Therapy

## 2021-04-25 DIAGNOSIS — M6281 Muscle weakness (generalized): Secondary | ICD-10-CM | POA: Insufficient documentation

## 2021-04-25 DIAGNOSIS — R262 Difficulty in walking, not elsewhere classified: Secondary | ICD-10-CM | POA: Insufficient documentation

## 2021-04-25 DIAGNOSIS — R252 Cramp and spasm: Secondary | ICD-10-CM | POA: Diagnosis not present

## 2021-04-25 NOTE — Therapy (Signed)
[facility] @ [place], [place], [address] Phone: [phone]   Fax:  [phone]  Physical Therapy Treatment  Patient Details  Name: Vanessa Price MRN: [id number] Date of Birth: 09-16-1955 Referring Provider (PT): Dr Erline Hau   Encounter Date: 04/25/2021   PT End of Session - 04/25/21 1226     Visit Number 3    Date for PT Re-Evaluation 06/07/21    Authorization Type UHC Medicare    Progress Note Due on Visit 10    PT Start Time 1145    PT Stop Time 1226    PT Time Calculation (min) 41 min    Activity Tolerance Patient tolerated treatment well;No increased pain    Behavior During Therapy [facility] for tasks assessed/performed             Past Medical History:  Diagnosis Date   ANXIETY 06/04/2009   GERD 08/14/2008   occasional   Hx of adenomatous polyp of colon 03/11/2018   HYPERLIPIDEMIA 09/14/2006   HYPERTENSION 09/14/2006   Impaired glucose tolerance    MYOCARDIAL PERFUSION SCAN, WITH STRESS TEST, ABNORMAL 10/19/2008   PREMATURE VENTRICULAR CONTRACTIONS 08/14/2008    Past Surgical History:  Procedure Laterality Date   ABDOMINAL HYSTERECTOMY  1999   menorrhagia/partial   CESAREAN SECTION     2 times    There were no vitals filed for this visit.   Subjective Assessment - 04/25/21 1147     Subjective Pt is in 4/10 in back today and reports she did have a lot of pain after last PT session and requested a little bit less intense today if possible    Pertinent History Hyperlipidemia, impaired glucose tolerance, hypertension, obesity.  She was recently diagnosed with obstructive sleep apnea and is using nightly CPAP.    Limitations Walking    How long can you sit comfortably? 30 min    How long can you stand comfortably? 1 hour    How long can you walk comfortably? 30 min    Diagnostic tests radiographs with Dr [name], pt reports revealed OA of back    Currently in Pain?  Yes    Pain Score 4     Pain Location Back                               [facility] Adult PT Treatment/Exercise - 04/25/21 0001       Lumbar Exercises: Stretches   Single Knee to Chest Stretch 3 reps;10 seconds    Single Knee to Chest Stretch Limitations supine    Hip Flexor Stretch Left;2 reps;20 seconds;Right    Hip Flexor Stretch Limitations in hooklying    Piriformis Stretch Left;Right;3 reps;10 seconds    Figure 4 Stretch 3 reps;10 seconds;With overpressure    Figure 4 Stretch Limitations supine (cross foot over knee, push knee away)      Lumbar Exercises: Aerobic   Nustep L3 x 5' seat 9 arms 44 (old model NuStep)      Lumbar Exercises: Standing   Other Standing Lumbar Exercises half kneel on mat static for 45s each side and additional x3 over head reaches for improved pelvic and cores stability. min A for Lt kneel      Lumbar Exercises: Seated   Long Arc Quad on Chair AROM;Both;1 set;10 reps    Sit to Stand 20 reps    Sit to Stand Limitations no UE  support, from mat;x10 with 5#    Other Seated Lumbar Exercises pushing into foam roller 2x10 2-3s isometric for core activations    Other Seated Lumbar Exercises pelvic tilts on ball ant/oost and Lt/Rt 2x10 each      Lumbar Exercises: Supine   Ab Set 20 reps    AB Set Limitations pushing into red ball      Modalities   Modalities Moist Heat      Moist Heat Therapy   Number Minutes Moist Heat 8 Minutes    Moist Heat Location Lumbar Spine   during supine stretching                    PT Education - 04/25/21 1226     Education Details Pt educated on continued HEP    Person(s) Educated Patient    Methods Explanation;Demonstration;Tactile cues;Verbal cues    Comprehension Returned demonstration;Verbalized understanding              PT Short Term Goals - 04/23/21 1338       PT SHORT TERM GOAL #1   Title Pt will be indepenent with initial HEP.    Status On-going                PT Long Term Goals - 04/16/21 1551       PT LONG TERM GOAL #1   Title Pt will be independent with advanced HEP.    Time 8    Period Weeks    Status New      PT LONG TERM GOAL #2   Title Pt will increase FOTO to at least 63% to demonstrate increased functional mobility.    Baseline 57%    Time 8    Period Weeks    Status New      PT LONG TERM GOAL #3   Title Pt will increase L hip and knee strength to at least 4+/5 to allow her to play with her grandson.    Time 8    Period Weeks    Status New      PT LONG TERM GOAL #4   Title Pt will report being able to negotiate the stairs in her home with railing and reciprocal pattern without increased pain.    Time 8    Period Weeks    Status New      PT LONG TERM GOAL #5   Title Pt will be able to perform single leg stance for at least 10 seconds to demonstrate decreased risk of falling.    Time 8    Period Weeks    Status New                   Plan - 04/25/21 1227     Clinical Impression Statement Pt reported 4/10 pain at start of session and 2/10 pain at end of session. Session modifed this date for more supine core strengthening and stretching to decrease strain at back as pt had increased pain after last session. Pt also requested to attempt heat if possible to see if this lessened tightness, place at low back during supine stretching with pt reporting this improved greatly and felt much better, placed with x3 layers. Pt tolerated session focused on back and hip stretching and core and hip strengthening for decreased back pain with good effect and tolerance noted. Pt demonstrated need of rest breaks and cues for tecnhinques throughout but tolerated well. Pt would benefit from additional PT to further  address needs.    Comorbidities Hyperlipidemia, impaired glucose tolerance, hypertension, OSA with CPAP    Examination-Activity Limitations Bathing;Locomotion Level;Squat;Stairs    Examination-Participation Restrictions  Community Activity    Stability/Clinical Decision Making Evolving/Moderate complexity    Clinical Decision Making Moderate    Rehab Potential Good    PT Frequency 2x / week    PT Duration 8 weeks    PT Treatment/Interventions ADLs/Self Care Home Management;Aquatic Therapy;Cryotherapy;Electrical Stimulation;Iontophoresis 4mg /ml Dexamethasone;Moist Heat;Traction;Ultrasound;Gait training;Stair training;Functional mobility training;Therapeutic activities;Therapeutic exercise;Balance training;Neuromuscular re-education;Patient/family education;Manual techniques;Passive range of motion;Dry needling;Taping;Spinal Manipulations;Joint Manipulations    PT Next Visit Plan review updated HEP, monitor postural alignment within ther ex, Pt benefits from varying standing and seated ther ex for core/LE strength    PT Home Exercise Plan Access Code: [email or web address]    Consulted and Agree with Plan of Care Patient             Patient will benefit from skilled therapeutic intervention in order to improve the following deficits and impairments:  Difficulty walking, Increased muscle spasms, Pain, Impaired flexibility, Decreased strength, Decreased balance  Visit Diagnosis: Muscle weakness (generalized)  Difficulty in walking, not elsewhere classified     Problem List Patient Active Problem List   Diagnosis Date Noted   Moderate obstructive sleep apnea-hypopnea syndrome 02/25/2021   Obesity (BMI 30.0-34.9) 01/17/2021   Snoring 01/17/2021   Vitamin D deficiency 10/26/2018   Hx of adenomatous polyp of colon 03/11/2018   Constipation 02/10/2018   Impaired glucose tolerance 10/02/2015   ANXIETY 06/04/2009   MYOCARDIAL PERFUSION SCAN, WITH STRESS TEST, ABNORMAL 10/19/2008   PREMATURE VENTRICULAR CONTRACTIONS 08/14/2008   GERD 08/14/2008   Goiter 01/04/2008   Dyslipidemia 09/14/2006   Essential hypertension 09/14/2006    Stacy Gardner, PT, DPT 04/25/2310:36 PM    [facility] @ [place], [place], [address] Phone: [phone]   Fax:  [phone]  Name: Vanessa Price MRN: [id number] Date of Birth: 10-27-1955

## 2021-05-01 DIAGNOSIS — G4733 Obstructive sleep apnea (adult) (pediatric): Secondary | ICD-10-CM | POA: Diagnosis not present

## 2021-05-02 ENCOUNTER — Encounter: Payer: Medicare Other | Admitting: Rehabilitative and Restorative Service Providers"

## 2021-05-03 DIAGNOSIS — G4733 Obstructive sleep apnea (adult) (pediatric): Secondary | ICD-10-CM | POA: Diagnosis not present

## 2021-05-07 ENCOUNTER — Other Ambulatory Visit: Payer: Self-pay

## 2021-05-07 ENCOUNTER — Ambulatory Visit: Payer: Medicare Other | Admitting: Rehabilitative and Restorative Service Providers"

## 2021-05-07 ENCOUNTER — Encounter: Payer: Self-pay | Admitting: Rehabilitative and Restorative Service Providers"

## 2021-05-07 DIAGNOSIS — R262 Difficulty in walking, not elsewhere classified: Secondary | ICD-10-CM

## 2021-05-07 DIAGNOSIS — R252 Cramp and spasm: Secondary | ICD-10-CM

## 2021-05-07 DIAGNOSIS — M6281 Muscle weakness (generalized): Secondary | ICD-10-CM | POA: Diagnosis not present

## 2021-05-07 NOTE — Therapy (Signed)
[facility] @ [place], [place], [address] Phone: [phone]   Fax:  [phone]  Physical Therapy Treatment  Patient Details  Name: Vanessa Price MRN: [id number] Date of Birth: 08/01/1955 Referring Provider (PT): Dr Erline Hau   Encounter Date: 05/07/2021   PT End of Session - 05/07/21 1152     Visit Number 4    Date for PT Re-Evaluation 06/07/21    Authorization Type UHC Medicare    Progress Note Due on Visit 10    PT Start Time 1145    PT Stop Time 1225    PT Time Calculation (min) 40 min    Activity Tolerance Patient tolerated treatment well;No increased pain    Behavior During Therapy [facility] for tasks assessed/performed             Past Medical History:  Diagnosis Date   ANXIETY 06/04/2009   GERD 08/14/2008   occasional   Hx of adenomatous polyp of colon 03/11/2018   HYPERLIPIDEMIA 09/14/2006   HYPERTENSION 09/14/2006   Impaired glucose tolerance    MYOCARDIAL PERFUSION SCAN, WITH STRESS TEST, ABNORMAL 10/19/2008   PREMATURE VENTRICULAR CONTRACTIONS 08/14/2008    Past Surgical History:  Procedure Laterality Date   ABDOMINAL HYSTERECTOMY  1999   menorrhagia/partial   CESAREAN SECTION     2 times    There were no vitals filed for this visit.   Subjective Assessment - 05/07/21 1152     Subjective Pt reports being able to walk a half mile this morning without increased pain. Pt reports that she felt okay after last PT session.    Pertinent History Hyperlipidemia, impaired glucose tolerance, hypertension, obesity.  She was recently diagnosed with obstructive sleep apnea and is using nightly CPAP.    Patient Stated Goals Pt would ike to be able to get up and walk without pain and numbness.    Currently in Pain? No/denies                               [facility] Adult PT Treatment/Exercise - 05/07/21 0001       Lumbar Exercises: Stretches   Passive  Hamstring Stretch Right;Left;2 reps;20 seconds    Passive Hamstring Stretch Limitations in supine with strap    Lower Trunk Rotation 5 reps;10 seconds    Press Ups 10 reps    Piriformis Stretch Right;Left;2 reps;20 seconds    Piriformis Stretch Limitations in hooklying      Lumbar Exercises: Aerobic   Nustep L4 x 6 min with PT present to discuss progress.      Lumbar Exercises: Seated   Long Arc Quad on Chair Strengthening;Both;2 sets;10 reps    LAQ on Chair Weights (lbs) 2    Sit to Stand 20 reps    Sit to Stand Limitations holding 5# weight with cuing for TA contraction      Lumbar Exercises: Supine   Ab Set 10 reps;5 seconds    Pelvic Tilt 20 reps    Bent Knee Raise 20 reps    Bent Knee Raise Limitations holding PPT and ab set.    Bridge 20 reps    Straight Leg Raise 10 reps      Lumbar Exercises: Sidelying   Clam Both;15 reps      Lumbar Exercises: Prone   Straight Leg Raise 10 reps  PT Short Term Goals - 05/07/21 1230       PT SHORT TERM GOAL #1   Title Pt will be indepenent with initial HEP.    Status Partially Met               PT Long Term Goals - 05/07/21 1230       PT LONG TERM GOAL #1   Title Pt will be independent with advanced HEP.    Status On-going      PT LONG TERM GOAL #2   Title Pt will increase FOTO to at least 63% to demonstrate increased functional mobility.    Status On-going      PT LONG TERM GOAL #3   Title Pt will increase L hip and knee strength to at least 4+/5 to allow her to play with her grandson.    Status On-going      PT LONG TERM GOAL #4   Title Pt will report being able to negotiate the stairs in her home with railing and reciprocal pattern without increased pain.    Status On-going      PT LONG TERM GOAL #5   Title Pt will be able to perform single leg stance for at least 10 seconds to demonstrate decreased risk of falling.    Status On-going                   Plan -  05/07/21 1226     Clinical Impression Statement Pt reported as she continued to perform ther ex today that her numbness and tingling had ceased and she was feeling better.  Pt requires cuing during ther ex for body mechanics and core stability.  Pt continues to require skilled PT to progress towards goal related activities.    Personal Factors and Comorbidities Comorbidity 3+    Comorbidities Hyperlipidemia, impaired glucose tolerance, hypertension, OSA with CPAP    PT Treatment/Interventions ADLs/Self Care Home Management;Aquatic Therapy;Cryotherapy;Electrical Stimulation;Iontophoresis 68m/ml Dexamethasone;Moist Heat;Traction;Ultrasound;Gait training;Stair training;Functional mobility training;Therapeutic activities;Therapeutic exercise;Balance training;Neuromuscular re-education;Patient/family education;Manual techniques;Passive range of motion;Dry needling;Taping;Spinal Manipulations;Joint Manipulations    PT Next Visit Plan review updated HEP, monitor postural alignment within ther ex, Pt benefits from varying standing and seated ther ex for core/LE strength    PT Home Exercise Plan Access Code: [email or web address]   Consulted and Agree with Plan of Care Patient             Patient will benefit from skilled therapeutic intervention in order to improve the following deficits and impairments:  Difficulty walking, Increased muscle spasms, Pain, Impaired flexibility, Decreased strength, Decreased balance  Visit Diagnosis: Muscle weakness (generalized)  Difficulty in walking, not elsewhere classified  Cramp and spasm     Problem List Patient Active Problem List   Diagnosis Date Noted   Moderate obstructive sleep apnea-hypopnea syndrome 02/25/2021   Obesity (BMI 30.0-34.9) 01/17/2021   Snoring 01/17/2021   Vitamin D deficiency 10/26/2018   Hx of adenomatous polyp of colon 03/11/2018   Constipation 02/10/2018   Impaired glucose tolerance 10/02/2015   ANXIETY 06/04/2009   MYOCARDIAL  PERFUSION SCAN, WITH STRESS TEST, ABNORMAL 10/19/2008   PREMATURE VENTRICULAR CONTRACTIONS 08/14/2008   GERD 08/14/2008   Goiter 01/04/2008   Dyslipidemia 09/14/2006   Essential hypertension 09/14/2006    SJuel Burrow PT, DPT 05/07/2021, 12:31 PM  [place] [place] [address]Phone: [phone]  Fax:  [phone] Name: Vanessa GauMRN: [id number] of  Birth: 04/29/1955

## 2021-05-14 ENCOUNTER — Encounter: Payer: Self-pay | Admitting: Rehabilitative and Restorative Service Providers"

## 2021-05-14 ENCOUNTER — Ambulatory Visit: Payer: Medicare Other | Admitting: Rehabilitative and Restorative Service Providers"

## 2021-05-14 ENCOUNTER — Other Ambulatory Visit: Payer: Self-pay

## 2021-05-14 ENCOUNTER — Telehealth: Payer: Self-pay | Admitting: Pharmacist

## 2021-05-14 DIAGNOSIS — M6281 Muscle weakness (generalized): Secondary | ICD-10-CM | POA: Diagnosis not present

## 2021-05-14 DIAGNOSIS — R252 Cramp and spasm: Secondary | ICD-10-CM | POA: Diagnosis not present

## 2021-05-14 DIAGNOSIS — R262 Difficulty in walking, not elsewhere classified: Secondary | ICD-10-CM | POA: Diagnosis not present

## 2021-05-14 NOTE — Chronic Care Management (AMB) (Signed)
° ° °  Chronic Care Management Pharmacy Assistant   Name: Sophiamarie Nease  MRN: [id number] DOB: December 10, 1955  Reason for Encounter: Reschedule appointment with [name].   Rescheduled with patient to 09/09/2021.  [facility] Pharmacist Assistant [phone]

## 2021-05-14 NOTE — Therapy (Signed)
[facility] @ [place], [place], [address] Phone: [phone]   Fax:  [phone]  Physical Therapy Treatment  Patient Details  Name: Vanessa Price MRN: [id number] Date of Birth: January 12, 1956 Referring Provider (PT): Dr Erline Hau   Encounter Date: 05/14/2021   PT End of Session - 05/14/21 1105     Visit Number 5    Date for PT Re-Evaluation 06/07/21    Authorization Type U[employer] Medicare    Progress Note Due on Visit 10    PT Start Time 1100    PT Stop Time 1140    PT Time Calculation (min) 40 min    Activity Tolerance Patient tolerated treatment well;No increased pain    Behavior During Therapy [facility] for tasks assessed/performed             Past Medical History:  Diagnosis Date   ANXIETY 06/04/2009   GERD 08/14/2008   occasional   Hx of adenomatous polyp of colon 03/11/2018   HYPERLIPIDEMIA 09/14/2006   HYPERTENSION 09/14/2006   Impaired glucose tolerance    MYOCARDIAL PERFUSION SCAN, WITH STRESS TEST, ABNORMAL 10/19/2008   PREMATURE VENTRICULAR CONTRACTIONS 08/14/2008    Past Surgical History:  Procedure Laterality Date   ABDOMINAL HYSTERECTOMY  1999   menorrhagia/partial   CESAREAN SECTION     2 times    There were no vitals filed for this visit.   Subjective Assessment - 05/14/21 1106     Subjective Pt reports feeling stiff, but states that she has not been as compliant with her HEP.    Pertinent History Hyperlipidemia, impaired glucose tolerance, hypertension, obesity.  She was recently diagnosed with obstructive sleep apnea and is using nightly CPAP.    Patient Stated Goals Pt would ike to be able to get up and walk without pain and numbness.    Currently in Pain? Yes    Pain Score 6     Pain Location Back    Pain Orientation Lower    Pain Descriptors / Indicators Tightness    Pain Type Chronic pain                               [facility] Adult PT  Treatment/Exercise - 05/14/21 0001       Lumbar Exercises: Stretches   Passive Hamstring Stretch Right;Left;2 reps;20 seconds    Passive Hamstring Stretch Limitations standing with foot on step    Single Knee to Chest Stretch Right;Left;2 reps;10 seconds    Lower Trunk Rotation 5 reps;10 seconds    Hip Flexor Stretch Right;Left;2 reps;20 seconds    Hip Flexor Stretch Limitations foot on 2nd step    Press Ups 20 reps    Piriformis Stretch Right;Left;2 reps;20 seconds    Piriformis Stretch Limitations in hooklying    Other Lumbar Stretch Exercise Groin butterfly stretch 2x20 sec.      Lumbar Exercises: Aerobic   Nustep L5 x 6 min with PT present to discuss progress.   361 steps     Lumbar Exercises: Supine   Pelvic Tilt 20 reps    Pelvic Tilt Limitations with cuing for TA contraction    Bent Knee Raise 20 reps    Bent Knee Raise Limitations holding PPT and ab set.    Bridge 20 reps    Straight Leg Raise 20 reps    Straight Leg Raises Limitations 2# Bilat      Lumbar  Exercises: Sidelying   Hip Abduction Both;20 reps    Hip Abduction Weights (lbs) 2      Lumbar Exercises: Prone   Straight Leg Raise 20 reps    Straight Leg Raises Limitations 2#                       PT Short Term Goals - 05/14/21 1142       PT SHORT TERM GOAL #1   Title Pt will be indepenent with initial HEP.    Status Partially Met               PT Long Term Goals - 05/14/21 1142       PT LONG TERM GOAL #1   Title Pt will be independent with advanced HEP.    Status On-going      PT LONG TERM GOAL #2   Title Pt will increase FOTO to at least 63% to demonstrate increased functional mobility.    Status On-going      PT LONG TERM GOAL #3   Title Pt will increase L hip and knee strength to at least 4+/5 to allow her to play with her grandson.    Status On-going      PT LONG TERM GOAL #4   Title Pt will report being able to negotiate the stairs in her home with railing and  reciprocal pattern without increased pain.    Status On-going      PT LONG TERM GOAL #5   Title Pt will be able to perform single leg stance for at least 10 seconds to demonstrate decreased risk of falling.    Status On-going                   Plan - 05/14/21 1139     Clinical Impression Statement Pt reports that overall, she feels 95% better following PT session.  Pt continues to progress with stretching in clinic with min cuing for body mechanics and technique.  Pt continues with decreased core stability and decreased flexibility.  Pt with difficulty with sidelying hip abduction and prone hip extension today secondary to weakness. Pt educated on the importance of daily compliance with HEP, she verbalizes her understanding. Pt continues to require skilled PT to progress towards goal related activities.    Personal Factors and Comorbidities Comorbidity 3+    Comorbidities Hyperlipidemia, impaired glucose tolerance, hypertension, OSA with CPAP    PT Treatment/Interventions ADLs/Self Care Home Management;Aquatic Therapy;Cryotherapy;Electrical Stimulation;Iontophoresis 54m/ml Dexamethasone;Moist Heat;Traction;Ultrasound;Gait training;Stair training;Functional mobility training;Therapeutic activities;Therapeutic exercise;Balance training;Neuromuscular re-education;Patient/family education;Manual techniques;Passive range of motion;Dry needling;Taping;Spinal Manipulations;Joint Manipulations    PT Next Visit Plan review updated HEP, monitor postural alignment within ther ex, Pt benefits from varying standing and seated ther ex for core/LE strength    PT Home Exercise Plan Access Code: [email or web address]   Consulted and Agree with Plan of Care Patient             Patient will benefit from skilled therapeutic intervention in order to improve the following deficits and impairments:  Difficulty walking, Increased muscle spasms, Pain, Impaired flexibility, Decreased strength, Decreased balance  Visit  Diagnosis: Muscle weakness (generalized)  Difficulty in walking, not elsewhere classified  Cramp and spasm     Problem List Patient Active Problem List   Diagnosis Date Noted   Moderate obstructive sleep apnea-hypopnea syndrome 02/25/2021   Obesity (BMI 30.0-34.9) 01/17/2021   Snoring 01/17/2021   Vitamin D deficiency 10/26/2018   Hx of adenomatous  polyp of colon 03/11/2018   Constipation 02/10/2018   Impaired glucose tolerance 10/02/2015   ANXIETY 06/04/2009   MYOCARDIAL PERFUSION SCAN, WITH STRESS TEST, ABNORMAL 10/19/2008   PREMATURE VENTRICULAR CONTRACTIONS 08/14/2008   GERD 08/14/2008   Goiter 01/04/2008   Dyslipidemia 09/14/2006   Essential hypertension 09/14/2006    Juel Burrow, PT, DPT 05/14/2021, 11:44 AM  [facility] @ [place], [place], [address] Phone: [phone]   Fax:  [phone]  Name: Vanessa Price MRN: [id number] Date of Birth: 18-Oct-1955

## 2021-05-15 ENCOUNTER — Ambulatory Visit: Payer: Medicare Other | Admitting: Physical Therapy

## 2021-05-21 ENCOUNTER — Other Ambulatory Visit: Payer: Self-pay

## 2021-05-21 ENCOUNTER — Encounter: Payer: Self-pay | Admitting: Rehabilitative and Restorative Service Providers"

## 2021-05-21 ENCOUNTER — Ambulatory Visit: Payer: Medicare Other | Admitting: Rehabilitative and Restorative Service Providers"

## 2021-05-21 DIAGNOSIS — R262 Difficulty in walking, not elsewhere classified: Secondary | ICD-10-CM | POA: Diagnosis not present

## 2021-05-21 DIAGNOSIS — R252 Cramp and spasm: Secondary | ICD-10-CM

## 2021-05-21 DIAGNOSIS — M6281 Muscle weakness (generalized): Secondary | ICD-10-CM

## 2021-05-21 NOTE — Therapy (Signed)
[facility] @ [place], [place], [address] Phone: [phone]   Fax:  [phone]  Physical Therapy Treatment  Patient Details  Name: Vanessa Price MRN: [id number] Date of Birth: [birth year] Referring Provider (PT): Dr Erline Hau   Encounter Date: 05/21/2021   PT End of Session - 05/21/21 1234     Visit Number 6    Date for PT Re-Evaluation 06/07/21    Authorization Type UHC Medicare    Progress Note Due on Visit 10    PT Start Time 1230    PT Stop Time 1310    PT Time Calculation (min) 40 min    Activity Tolerance Patient tolerated treatment well;No increased pain    Behavior During Therapy [facility] for tasks assessed/performed             Past Medical History:  Diagnosis Date   ANXIETY 06/04/2009   GERD 08/14/2008   occasional   Hx of adenomatous polyp of colon 03/11/2018   HYPERLIPIDEMIA 09/14/2006   HYPERTENSION 09/14/2006   Impaired glucose tolerance    MYOCARDIAL PERFUSION SCAN, WITH STRESS TEST, ABNORMAL 10/19/2008   PREMATURE VENTRICULAR CONTRACTIONS 08/14/2008    Past Surgical History:  Procedure Laterality Date   ABDOMINAL HYSTERECTOMY  1999   menorrhagia/partial   CESAREAN SECTION     2 times    There were no vitals filed for this visit.   Subjective Assessment - 05/21/21 1234     Subjective Pt reports that she is doing better with her HEP, she reports that she is doing it in the morning before she gets out of bed.    Pertinent History Hyperlipidemia, impaired glucose tolerance, hypertension, obesity.  She was recently diagnosed with obstructive sleep apnea and is using nightly CPAP.    Patient Stated Goals Pt would ike to be able to get up and walk without pain and numbness.    Currently in Pain? Yes    Pain Score 4     Pain Location Back    Pain Orientation Lower    Pain Descriptors / Indicators Dull    Pain Type Chronic pain                                [facility] Adult PT Treatment/Exercise - 05/21/21 0001       Lumbar Exercises: Stretches   Passive Hamstring Stretch Right;Left;2 reps;20 seconds    Passive Hamstring Stretch Limitations in supine with strap:  Add in hip adductor and IT band stretch to each side 2x20 sec each bilat.    Lower Trunk Rotation 5 reps;10 seconds    Press Ups 20 reps    Piriformis Stretch Right;Left;2 reps;20 seconds    Piriformis Stretch Limitations in hooklying      Lumbar Exercises: Aerobic   Nustep L5 x 6 min with PT present to discuss progress.      Lumbar Exercises: Seated   Sit to Stand 10 reps    Sit to Stand Limitations holding yellow weight ball      Lumbar Exercises: Supine   Bridge 20 reps    Straight Leg Raise 20 reps    Straight Leg Raises Limitations 2# Bilat      Lumbar Exercises: Prone   Straight Leg Raise 20 reps    Straight Leg Raises Limitations 2#  PT Short Term Goals - 05/21/21 1322       PT SHORT TERM GOAL #1   Title Pt will be indepenent with initial HEP.    Status Achieved               PT Long Term Goals - 05/14/21 1142       PT LONG TERM GOAL #1   Title Pt will be independent with advanced HEP.    Status On-going      PT LONG TERM GOAL #2   Title Pt will increase FOTO to at least 63% to demonstrate increased functional mobility.    Status On-going      PT LONG TERM GOAL #3   Title Pt will increase L hip and knee strength to at least 4+/5 to allow her to play with her grandson.    Status On-going      PT LONG TERM GOAL #4   Title Pt will report being able to negotiate the stairs in her home with railing and reciprocal pattern without increased pain.    Status On-going      PT LONG TERM GOAL #5   Title Pt will be able to perform single leg stance for at least 10 seconds to demonstrate decreased risk of falling.    Status On-going                   Plan - 05/21/21 1319      Clinical Impression Statement Pt continues to progress towards goal related activities and is progressing with overall decreased pain. She reported feelng better following IT band stretch and groin stretch with band. Tobie progressed better with hip extension exercise today and is more compliant with HEP.  Min cuing required throughout for technique and body mechanics.  Pt continues to require skilled PT to progress towards goal related activities.    Personal Factors and Comorbidities Comorbidity 3+    Comorbidities Hyperlipidemia, impaired glucose tolerance, hypertension, OSA with CPAP    PT Treatment/Interventions ADLs/Self Care Home Management;Aquatic Therapy;Cryotherapy;Electrical Stimulation;Iontophoresis 4mg /ml Dexamethasone;Moist Heat;Traction;Ultrasound;Gait training;Stair training;Functional mobility training;Therapeutic activities;Therapeutic exercise;Balance training;Neuromuscular re-education;Patient/family education;Manual techniques;Passive range of motion;Dry needling;Taping;Spinal Manipulations;Joint Manipulations    PT Next Visit Plan review updated HEP, monitor postural alignment within ther ex, Pt benefits from varying standing and seated ther ex for core/LE strength    PT Home Exercise Plan Access Code: [email or web address]    Consulted and Agree with Plan of Care Patient             Patient will benefit from skilled therapeutic intervention in order to improve the following deficits and impairments:  Difficulty walking, Increased muscle spasms, Pain, Impaired flexibility, Decreased strength, Decreased balance  Visit Diagnosis: Muscle weakness (generalized)  Difficulty in walking, not elsewhere classified  Cramp and spasm     Problem List Patient Active Problem List   Diagnosis Date Noted   Moderate obstructive sleep apnea-hypopnea syndrome 02/25/2021   Obesity (BMI 30.0-34.9) 01/17/2021   Snoring 01/17/2021   Vitamin D deficiency 10/26/2018   Hx of adenomatous  polyp of colon 03/11/2018   Constipation 02/10/2018   Impaired glucose tolerance 10/02/2015   ANXIETY 06/04/2009   MYOCARDIAL PERFUSION SCAN, WITH STRESS TEST, ABNORMAL 10/19/2008   PREMATURE VENTRICULAR CONTRACTIONS 08/14/2008   GERD 08/14/2008   Goiter 01/04/2008   Dyslipidemia 09/14/2006   Essential hypertension 09/14/2006    Juel Burrow, PT 05/21/2021, 1:23 PM  [facility] @ [place], [place], [address]  Phone: [phone]   Fax:  [phone]  Name: Vanessa Price MRN: [id number] Date of Birth: 12-Aug-1955

## 2021-05-27 ENCOUNTER — Encounter: Payer: Self-pay | Admitting: Physical Therapy

## 2021-05-27 ENCOUNTER — Ambulatory Visit: Payer: Medicare Other | Attending: Internal Medicine | Admitting: Physical Therapy

## 2021-05-27 ENCOUNTER — Other Ambulatory Visit: Payer: Self-pay

## 2021-05-27 DIAGNOSIS — R262 Difficulty in walking, not elsewhere classified: Secondary | ICD-10-CM | POA: Diagnosis not present

## 2021-05-27 DIAGNOSIS — R252 Cramp and spasm: Secondary | ICD-10-CM | POA: Insufficient documentation

## 2021-05-27 DIAGNOSIS — M6281 Muscle weakness (generalized): Secondary | ICD-10-CM | POA: Insufficient documentation

## 2021-05-27 NOTE — Therapy (Signed)
[facility] ?[facility] @ [place] ?[place], [place], [address] ?Phone: [phone]   Fax:  [phone] ? ?Physical Therapy Treatment ? ?Patient Details  ?Name: Vanessa Price ?MRN: [id number] ?Date of Birth: 1955-12-24 ?Referring Provider (PT): Dr Erline Hau ? ? ?Encounter Date: 05/27/2021 ? ? PT End of Session - 05/27/21 1018   ? ? Visit Number 7   ? Date for PT Re-Evaluation 06/07/21   ? Authorization Type UHC Medicare   ? Progress Note Due on Visit 10   ? PT Start Time 1016   ? PT Stop Time 1056   ? PT Time Calculation (min) 40 min   ? Activity Tolerance Patient tolerated treatment well;No increased pain   ? Behavior During Therapy [facility] for tasks assessed/performed   ? ?  ?  ? ?  ? ? ?Past Medical History:  ?Diagnosis Date  ? ANXIETY 06/04/2009  ? GERD 08/14/2008  ? occasional  ? Hx of adenomatous polyp of colon 03/11/2018  ? HYPERLIPIDEMIA 09/14/2006  ? HYPERTENSION 09/14/2006  ? Impaired glucose tolerance   ? MYOCARDIAL PERFUSION SCAN, WITH STRESS TEST, ABNORMAL 10/19/2008  ? PREMATURE VENTRICULAR CONTRACTIONS 08/14/2008  ? ? ?Past Surgical History:  ?Procedure Laterality Date  ? ABDOMINAL HYSTERECTOMY  1999  ? menorrhagia/partial  ? CESAREAN SECTION    ? 2 times  ? ? ?There were no vitals filed for this visit. ? ? Subjective Assessment - 05/27/21 1021   ? ? Subjective Feeling ok this AM   ? Pertinent History Hyperlipidemia, impaired glucose tolerance, hypertension, obesity.  She was recently diagnosed with obstructive sleep apnea and is using nightly CPAP.   ? Currently in Pain? No/denies   ? ?  ?  ? ?  ? ? ? ? ? ? ? ? ? ? ? ? ? ? ? ? ? ? ? ? [facility] Adult PT Treatment/Exercise - 05/27/21 0001   ? ?  ? Lumbar Exercises: Stretches  ? Passive Hamstring Stretch Right;Left;2 reps;20 seconds   ? Passive Hamstring Stretch Limitations in supine with strap:  Add in hip adductor and IT band stretch to each side 2x20 sec each bilat.   ? Lower Trunk Rotation 5  reps;10 seconds   Initial rocks 20x, then static holds  ? Piriformis Stretch Right;Left;2 reps;20 seconds   ? Piriformis Stretch Limitations in hooklying   ?  ? Lumbar Exercises: Aerobic  ? Nustep L5 x 8 min with PTA present to discuss current status   ?  ? Lumbar Exercises: Standing  ? Other Standing Lumbar Exercises Hip abduction Bil 2.5# 10x   ?  ? Lumbar Exercises: Seated  ? Sit to Stand 20 reps   2x10  ? Sit to Stand Limitations holding yellow weight ball   ?  ? Lumbar Exercises: Supine  ? Clam 10 reps   yellow loop 10x, VC for core contraction and proper breathing  ? Bridge with clamshell 10 reps   yellow loop  ? Straight Leg Raise --   2x10 with VC for core 2.5#  ? Straight Leg Raises Limitations 2.5# Bil   ?  ? Lumbar Exercises: Prone  ? Straight Leg Raise 20 reps   ? Straight Leg Raises Limitations 2.5#   ? ?  ?  ? ?  ? ? ? ? ? ? ? ? ? ? ? ? PT Short Term Goals - 05/21/21 1322   ? ?  ? PT SHORT TERM GOAL #1  ? Title  Pt will be indepenent with initial HEP.   ? Status Achieved   ? ?  ?  ? ?  ? ? ? ? PT Long Term Goals - 05/14/21 1142   ? ?  ? PT LONG TERM GOAL #1  ? Title Pt will be independent with advanced HEP.   ? Status On-going   ?  ? PT LONG TERM GOAL #2  ? Title Pt will increase FOTO to at least 63% to demonstrate increased functional mobility.   ? Status On-going   ?  ? PT LONG TERM GOAL #3  ? Title Pt will increase L hip and knee strength to at least 4+/5 to allow her to play with her grandson.   ? Status On-going   ?  ? PT LONG TERM GOAL #4  ? Title Pt will report being able to negotiate the stairs in her home with railing and reciprocal pattern without increased pain.   ? Status On-going   ?  ? PT LONG TERM GOAL #5  ? Title Pt will be able to perform single leg stance for at least 10 seconds to demonstrate decreased risk of falling.   ? Status On-going   ? ?  ?  ? ?  ? ? ? ? ? ? ? ? Plan - 05/27/21 1023   ? ? Clinical Impression Statement Pt arrives pain to PT session pain free and 'feeling pretty  good." Pt tolerated all increases in her loads well, demonstrating good form and core stabilization. Pt did require verbal cuing on sit to stand as she tended to veer RT but could correct easily.   ? Personal Factors and Comorbidities Comorbidity 3+   ? Comorbidities Hyperlipidemia, impaired glucose tolerance, hypertension, OSA with CPAP   ? Examination-Activity Limitations Bathing;Locomotion Level;Squat;Stairs   ? Examination-Participation Restrictions Community Activity   ? Stability/Clinical Decision Making Evolving/Moderate complexity   ? Rehab Potential Good   ? PT Frequency 2x / week   ? PT Duration 8 weeks   ? PT Treatment/Interventions ADLs/Self Care Home Management;Aquatic Therapy;Cryotherapy;Electrical Stimulation;Iontophoresis '4mg'$ /ml Dexamethasone;Moist Heat;Traction;Ultrasound;Gait training;Stair training;Functional mobility training;Therapeutic activities;Therapeutic exercise;Balance training;Neuromuscular re-education;Patient/family education;Manual techniques;Passive range of motion;Dry needling;Taping;Spinal Manipulations;Joint Manipulations   ? PT Next Visit Plan review updated HEP, monitor postural alignment within ther ex, Pt benefits from varying standing and seated ther ex for core/LE strength   ? PT Home Exercise Plan Access Code: [email or web address]   ? Consulted and Agree with Plan of Care Patient   ? ?  ?  ? ?  ? ? ?Patient will benefit from skilled therapeutic intervention in order to improve the following deficits and impairments:  Difficulty walking, Increased muscle spasms, Pain, Impaired flexibility, Decreased strength, Decreased balance ? ?Visit Diagnosis: ?Muscle weakness (generalized) ? ?Difficulty in walking, not elsewhere classified ? ?Cramp and spasm ? ? ? ? ?Problem List ?Patient Active Problem List  ? Diagnosis Date Noted  ? Moderate obstructive sleep apnea-hypopnea syndrome 02/25/2021  ? Obesity (BMI 30.0-34.9) 01/17/2021  ? Snoring 01/17/2021  ? Vitamin D deficiency 10/26/2018  ? Hx of  adenomatous polyp of colon 03/11/2018  ? Constipation 02/10/2018  ? Impaired glucose tolerance 10/02/2015  ? ANXIETY 06/04/2009  ? MYOCARDIAL PERFUSION SCAN, WITH STRESS TEST, ABNORMAL 10/19/2008  ? PREMATURE VENTRICULAR CONTRACTIONS 08/14/2008  ? GERD 08/14/2008  ? Goiter 01/04/2008  ? Dyslipidemia 09/14/2006  ? Essential hypertension 09/14/2006  ? ? ?Terica Yogi, PTA ?05/27/2021, 11:49 AM ? ?[facility] [place] ?[facility] @ [place] ?[place], [place],  [address] ?Phone: [phone]   Fax:  [phone] ? ?Name: Tashena Ibach ?MRN: [id number] ?Date of Birth: 31-Jan-1956 ? ? ? ?

## 2021-05-29 ENCOUNTER — Ambulatory Visit: Payer: Medicare Other | Admitting: Physical Therapy

## 2021-05-30 ENCOUNTER — Ambulatory Visit: Payer: Medicare Other | Admitting: Neurology

## 2021-05-30 ENCOUNTER — Encounter: Payer: Self-pay | Admitting: Neurology

## 2021-05-30 VITALS — BP 141/80 | HR 80 | Ht 64.0 in | Wt 198.0 lb

## 2021-05-30 DIAGNOSIS — Z9989 Dependence on other enabling machines and devices: Secondary | ICD-10-CM | POA: Diagnosis not present

## 2021-05-30 DIAGNOSIS — E669 Obesity, unspecified: Secondary | ICD-10-CM

## 2021-05-30 DIAGNOSIS — G4733 Obstructive sleep apnea (adult) (pediatric): Secondary | ICD-10-CM | POA: Diagnosis not present

## 2021-05-30 NOTE — Progress Notes (Signed)
SLEEP MEDICINE CLINIC    Provider:  Larey Seat, MD  Primary Care Physician:  [name], [name], MD [place] [address]     Referring Provider: [name], [name], [place],  [place] [address]          Chief Complaint according to patient   Patient presents with:     New Patient (Initial Visit)           HISTORY OF PRESENT ILLNESS:  Vanessa Price is a 66 y.o. African American female patient  seen in a Rv 05/30/2021 from PCP. Today is 9 March and I have the pleasure of meeting with this patient after she had undergone a home sleep test which was performed in November 2022.  At the time she had denied any daytime sleepiness her BMI had just reached a level under 35 and she produced a very long sleep time during her recording.  She was asleep for 9 hours 46 minutes - and had almost 40% REM sleep ,which is remarkable.   She did still have moderate severe sleep apnea the in REM sleep the AHI was 30.1/h in non-REM sleep 24.2 there was also a little significant accentuation while sleeping supine, on her back.   She had about 50% of the total sleep time accompanied by snoring which makes her more severe snoring.  Oxygen desaturation was minor only 3.2 minutes of low oxygen were noted and her heart rate varied in the normal range between 60 and 107 bpm.   I recommended.today Auto CPAP and she received the machine the machine is a lunar device and was provided by [facility].  Set up date was in January 2023.  She has used the machine 85% of the time the equivalent of 25 out of 30 days and each of those days over 4 hours.  The average user time is 7 hours and 6 minutes when she sleeps with her machine.  The mean pressure average pressure at the 95th percentile 8.7 cm water the average air leak is mild 4.6 L/min her residual apnea index is 1.3/h which is an excellent resolution.  However her average leak varies greatly  from day by day.  She uses a nasal pillow.  Since using CPAP her BMI has been reduced, she feels energized and well in AM.  No dry mouth.  No NOCTURIA !         CONSULT NOTE :   Chief concern according to patient :  " my daughter noted I am snoring louder, but I snored for years. My weight is stabile, but I am overweight. In summer ,I became short of breath, even at rest, supine. "    Vanessa Price   has a past medical history of ANXIETY (06/04/2009), GERD (08/14/2008), adenomatous polyp of colon (03/11/2018), HYPERLIPIDEMIA (09/14/2006), HYPERTENSION (09/14/2006), Impaired glucose tolerance, MYOCARDIAL PERFUSION SCAN, WITH STRESS TEST, ABNORMAL (10/19/2008), and PREMATURE VENTRICULAR CONTRACTIONS (08/14/2008).Obesity, BMI 34.      Sleep relevant medical history: Nocturia 1-2, no ENT/Tonsillectomy.    Family medical /sleep history: no other family member on CPAP with OSA.    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  The patient currently used to work in shifts( night/ rotating,) Pets are not present. Tobacco use quit 1995.  ETOH use: rare ,  Caffeine intake in form of Coffee( /)  Soda( /) Tea ( /) or energy drinks. Hobbies : reading. Walking.      Sleep habits are as follows: The patient's dinner time is between 5-6 PM. The patient goes to bed at 10 PM and continues to sleep for 3-5 hours, wakes for 1-2 bathroom breaks.   The preferred sleep position is prone , with the support of 1 pillow.  Dreams are reportedly rare.  7-8  AM is the usual rise time. The patient wakes up spontaneously.   She reports not feeling refreshed or restored in AM, without symptoms . Naps are taken infrequently, she can't nap-  She had the following risk factors for obstructive sleep apnea; her BMI has been close to 35 but is just under, her neck size is slightly larger than normal for her peer group  but most impressive is her high-grade Mallampati and the presence of retrognathia instead of prognathia.  These are anatomical risk factors for the presence of obstructive sleep apnea.  She is a former smoker but does not carry any diagnoses related to the tobacco use such as COPD etc.    Review of Systems: Out of a complete 14 system review, the patient complains of only the following symptoms, and all other reviewed systems are negative.:  Fatigue, sleepiness , snoring, fragmented sleep, unable to nap.    How likely are you to doze in the following situations: 0 = not likely, 1 = slight chance, 2 = moderate chance, 3 = high chance   Sitting and Reading? Watching Television? Sitting inactive in a public place (theater or meeting)? As a passenger in a car for an hour without a break? Lying down in the afternoon when circumstances permit? Sitting and talking to someone? Sitting quietly after lunch without alcohol? In a car, while stopped for a few minutes in traffic?   Total = 1/ 24 points   FSS endorsed at 12/ 63 points.  GDS1/ 15  Since using CPAP her BMI has been reduced, she feels energized and well in AM.  No dry mouth.  No NOCTURIA !    Social History   Socioeconomic History   Marital status: Widowed    Spouse name: Not on file   Number of children: 2   Years of education: Not on file   Highest education level: Not on file  Occupational History   Not on file  Tobacco Use   Smoking status: Former    Types: Cigarettes    Quit date: 03/24/1993    Years since quitting: 28.2   Smokeless tobacco: Never  Vaping Use   Vaping Use: Never used  Substance and Sexual Activity   Alcohol use: Not Currently    Comment: social   Drug use: No   Sexual activity: Not Currently    Birth control/protection: Surgical    Comment: HYst-1st intercourse 66 yo-More than 5 partners, hysterectomy  Other Topics Concern   Not on file  Social History Narrative   Live with daughter   Right  handed   Caffeine: some days non and sometimes 2 cups of coffee a week   Social Determinants of Health   Financial Resource Strain: Not on file  Food Insecurity: Not on file  Transportation Needs: Not on file  Physical Activity: Not on file  Stress: Not on file  Social Connections: Not on file    Family History  Problem Relation Age of Onset   Arthritis Mother    Diabetes Mother    Heart disease Mother  Mental illness Father    Cancer Brother        Lung    Past Medical History:  Diagnosis Date   ANXIETY 06/04/2009   GERD 08/14/2008   occasional   Hx of adenomatous polyp of colon 03/11/2018   HYPERLIPIDEMIA 09/14/2006   HYPERTENSION 09/14/2006   Impaired glucose tolerance    MYOCARDIAL PERFUSION SCAN, WITH STRESS TEST, ABNORMAL 10/19/2008   PREMATURE VENTRICULAR CONTRACTIONS 08/14/2008    Past Surgical History:  Procedure Laterality Date   ABDOMINAL HYSTERECTOMY  1999   menorrhagia/partial   CESAREAN SECTION     2 times     Current Outpatient Medications on File Prior to Visit  Medication Sig Dispense Refill   Evolocumab (REPATHA SURECLICK) 947 MG/ML SOAJ INJECT 1 DOSE INTO THE SKIN EVERY 14 DAYS 2 mL 11   losartan-hydrochlorothiazide (HYZAAR) 50-12.5 MG tablet TAKE 1 TABLET BY MOUTH  DAILY 90 tablet 3   Multiple Vitamins-Minerals (ONE DAILY WOMENS 50 PLUS) TABS Take by mouth. Centrum Womens 50 plus MVI     naproxen sodium (ALEVE) 220 MG tablet Take 220 mg by mouth as needed.      omega-3 acid ethyl esters (LOVAZA) 1 g capsule Take 2 capsules (2 g total) by mouth 2 (two) times daily. 360 capsule 3   No current facility-administered medications on file prior to visit.    Allergies  Allergen Reactions   Lisinopril     REACTION: cough   Risperidone     REACTION: unsure of dose; pt describes reaction of bad cough    Physical exam:  Today's Vitals   05/30/21 0921  BP: (!) 141/80  Pulse: 80  Weight: 198 lb (89.8 kg)  Height: '5\' 4"'$  (1.626 m)   Body mass  index is 33.99 kg/m.   Wt Readings from Last 3 Encounters:  05/30/21 198 lb (89.8 kg)  04/15/21 200 lb (90.7 kg)  02/04/21 200 lb 12.8 oz (91.1 kg)     Ht Readings from Last 3 Encounters:  05/30/21 '5\' 4"'$  (1.626 m)  02/04/21 '5\' 4"'$  (1.626 m)  01/17/21 '5\' 4"'$  (1.626 m)      General: The patient is awake, alert and appears not in acute distress. The patient is well groomed. Head: Normocephalic, atraumatic. Neck is supple. Mallampati 3 plus,  neck circumference:16.0  inches .  Nasal airflow  patent.  Retrognathia is seen.  Dental status: biological  Cardiovascular:  Regular rate and cardiac rhythm by pulse,  without distended neck veins. Skin:  Without evidence of ankle edema, or rash. Trunk: The patient's posture is erect.   Neurologic exam : The patient is awake and alert, oriented to place and time.   Memory subjective described as intact.  Attention span & concentration ability appears normal.  Speech is fluent,  without  dysarthria, dysphonia or aphasia.  Mood and affect are appropriate.   Cranial nerves: no loss of smell or taste reported  Pupils are equal and briskly reactive to light. Funduscopic exam deferred.. cataract present.   Facial motor strength is symmetric and tongue and uvula move midline.  Neck ROM : rotation, tilt and flexion extension were normal for age and shoulder shrug was symmetrical.      After spending a total time of  20  minutes face to face and additional time for physical and neurologic examination, review of laboratory studies,  personal review of imaging studies, reports and results of other testing and review of referral information / records as far as provided in visit,  I have established the following assessments:  1) Ms. 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   Since using CPAP her BMI has been reduced, she feels energized and well in AM.  No dry mouth.  No NOCTURIA !   My  Plan is to proceed with:  Continue CPAP use and avoid sleeping on her back.  Her nasal pillow will help.     I would like to thank [name], [name], MD and [name], [name], [place],  [place] [address] for allowing me to meet with and to take care of this pleasant patient.   In short, Shandel Jackson-Lorick is presenting with snoring, but not EDS.   I plan to follow up either personally or through our NP within 12 months.    Electronically signed by: Larey Seat, MD 05/30/2021 9:46 AM  [facility] Board certified by The [employer] of Sleep Medicine and Diplomate of the [employer] of Sleep Medicine. Board certified In Neurology through the [employer], Fellow of the [employer] of Neurology. Medical Director of [facility].

## 2021-05-31 DIAGNOSIS — G4733 Obstructive sleep apnea (adult) (pediatric): Secondary | ICD-10-CM | POA: Diagnosis not present

## 2021-06-03 ENCOUNTER — Ambulatory Visit: Payer: Medicare Other | Admitting: Rehabilitative and Restorative Service Providers"

## 2021-06-03 ENCOUNTER — Encounter: Payer: Medicare Other | Admitting: Physical Therapy

## 2021-06-03 ENCOUNTER — Other Ambulatory Visit: Payer: Self-pay

## 2021-06-03 ENCOUNTER — Encounter: Payer: Self-pay | Admitting: Rehabilitative and Restorative Service Providers"

## 2021-06-03 DIAGNOSIS — R262 Difficulty in walking, not elsewhere classified: Secondary | ICD-10-CM

## 2021-06-03 DIAGNOSIS — R252 Cramp and spasm: Secondary | ICD-10-CM | POA: Diagnosis not present

## 2021-06-03 DIAGNOSIS — M6281 Muscle weakness (generalized): Secondary | ICD-10-CM

## 2021-06-03 NOTE — Therapy (Signed)
?OUTPATIENT PHYSICAL THERAPY THORACOLUMBAR RE-EVALUATION ? ? ?Patient Name: Vanessa Price ?MRN: [id number] ?DOB:[birth year], 66 y.o., female ?Today's Date: 06/03/2021 ? ? PT End of Session - 06/03/21 1101   ? ? Visit Number 8   ? Date for PT Re-Evaluation 08/02/21   ? Authorization Type [employer] Medicare   ? Progress Note Due on Visit 10   ? PT Start Time 1059   ? PT Stop Time 1140   ? PT Time Calculation (min) 41 min   ? Activity Tolerance Patient tolerated treatment well;No increased pain   ? Behavior During Therapy [facility] for tasks assessed/performed   ? ?  ?  ? ?  ? ? ?Past Medical History:  ?Diagnosis Date  ? ANXIETY 06/04/2009  ? GERD 08/14/2008  ? occasional  ? Hx of adenomatous polyp of colon 03/11/2018  ? HYPERLIPIDEMIA 09/14/2006  ? HYPERTENSION 09/14/2006  ? Impaired glucose tolerance   ? MYOCARDIAL PERFUSION SCAN, WITH STRESS TEST, ABNORMAL 10/19/2008  ? PREMATURE VENTRICULAR CONTRACTIONS 08/14/2008  ? ?Past Surgical History:  ?Procedure Laterality Date  ? ABDOMINAL HYSTERECTOMY  1999  ? menorrhagia/partial  ? CESAREAN SECTION    ? 2 times  ? ?Patient Active Problem List  ? Diagnosis Date Noted  ? OSA on CPAP 05/30/2021  ? Moderate obstructive sleep apnea-hypopnea syndrome 02/25/2021  ? Obesity (BMI 30.0-34.9) 01/17/2021  ? Snoring 01/17/2021  ? Vitamin D deficiency 10/26/2018  ? Hx of adenomatous polyp of colon 03/11/2018  ? Constipation 02/10/2018  ? Impaired glucose tolerance 10/02/2015  ? ANXIETY 06/04/2009  ? MYOCARDIAL PERFUSION SCAN, WITH STRESS TEST, ABNORMAL 10/19/2008  ? PREMATURE VENTRICULAR CONTRACTIONS 08/14/2008  ? GERD 08/14/2008  ? Goiter 01/04/2008  ? Dyslipidemia 09/14/2006  ? Essential hypertension 09/14/2006  ? ? ?PCP: [name], [name], MD ? ?REFERRING PROVIDER: [name], [place]* ? ?REFERRING DIAG: M54.42,G89.29 (ICD-10-CM) - Chronic left-sided low back pain with left-sided sciatica ? ?THERAPY DIAG:  ?Muscle weakness (generalized) ? ?Difficulty in walking, not elsewhere  classified ? ?Cramp and spasm ? ?ONSET DATE: 04/15/2021 ? ?SUBJECTIVE:                                                                                                                                                                                          ? ?SUBJECTIVE STATEMENT: ?Pt reports that she needs to get back into the gym.  Pt reports that she was not as compliant over the weekend with her HEP. ? ?PERTINENT HISTORY:  ?Hyperlipidemia, impaired glucose tolerance, hypertension, obesity.  She was recently diagnosed with obstructive sleep apnea and is using nightly CPAP. ? ?PAIN:  ?Are you having pain? Yes: NPRS scale: 6/10 ?  Pain location: lumbar ?Pain description: dull ?Aggravating factors: increased activity ?Relieving factors: rest ? ? ?PRECAUTIONS: None ? ?WEIGHT BEARING RESTRICTIONS No ? ?FALLS:  ?Has patient fallen in last 6 months? No, Number of falls:  ? ?LIVING ENVIRONMENT: ?Lives with: lives with their family ?Lives in: House/apartment ?Stairs: Yes; two level home ?Has following equipment at home: None ? ?OCCUPATION: Retired.  Enjoys traveling ? ?PLOF: Independent ? ?PATIENT GOALS: Pt would ike to be able to get up and walk without pain and numbness. ? ? ?OBJECTIVE:  ? ?DIAGNOSTIC FINDINGS:  ?radiographs with Dr [name], pt reports revealed OA of back ? ?PATIENT SURVEYS:  ?FOTO 57% on 04/16/2021 (Projected 63% by visit 11) ?06/03/2021: 60% ? ?COGNITION: ? Overall cognitive status: Within functional limits for tasks assessed   ?  ?LE MMT: ? ?06/03/2021:  LLE hip and knee strength of grossly 4/5 throughout. ? ?FUNCTIONAL TESTS: ?06/03/2021 Single Leg Stance x10 sec on RLE, x4 sec on LLE ? ?TODAY'S TREATMENT  ? ?06/03/2021: ?Nustep L5 x8 min with PT present to discuss status ?Standing hamstring and hip flexor stretch at steps 2x20 sec Bilat ?Hooklying piriformis stretch 2x20 sec bilat ?Supine single knee to chest 20 sec x1 rep bilat ?Lower Trunk Rotation 4x10 sec hold bilat ?Prone press up 2x10 ?Hooklying  clam with 2x10 reps with yellow loop ?Bridging 2x10 ?Straight leg raise on mat (flexion, abduction, extension) 2.5# 2x10 bilat ? ? ?PATIENT EDUCATION:  ?Education details: Pt issued HEP ?Person educated: Patient ?Education method: Explanation and Demonstration ?Education comprehension: verbalized understanding and returned demonstration ? ? ?HOME EXERCISE PROGRAM: ?Access Code: [email or web address] ?URL: [email or web address] ?Date: 06/03/2021 ?Prepared by: Juel Burrow ? ?Exercises ?Standing Hamstring Stretch with Step - 1-2 x daily - 7 x weekly - 1 sets - 2 reps - 20 sec hold ?Hip Flexor Stretch on Step - 1-2 x daily - 7 x weekly - 1 sets - 2 reps - 15 sec hold ?Seated Sciatic Tensioner - 1-2 x daily - 7 x weekly - 1 sets - 3 reps - 10 sec hold ?Supine Figure 4 Piriformis Stretch - 1 x daily - 7 x weekly - 1 sets - 3 reps - 10 hold ?Hooklying Single Knee to Chest Stretch - 1 x daily - 7 x weekly - 1 sets - 3 reps - 10 hold ?Supine Piriformis Stretch with Foot on Ground - 1 x daily - 7 x weekly - 1 sets - 3 reps - 10 hold ?Seated Long Arc Quad - 1 x daily - 7 x weekly - 2 sets - 10 reps ?Sit to Stand - 1 x daily - 7 x weekly - 1 sets - 10 reps ? ? ?ASSESSMENT: ? ?CLINICAL IMPRESSION: ?Patient is a 66 y.o. female who was seen today for physical therapy re-evaluation and treatment for low back pain with L sided sciatica. Pt has made progress improved FOTO score and is able to maintain increased single leg stance balance. Pt has not yet met her goal related activities and would benefit from an additional 1-2x/week of skilled PT for 8 weeks. ? ? ?OBJECTIVE IMPAIRMENTS decreased balance, difficulty walking, decreased strength, impaired flexibility, postural dysfunction, and pain.  ? ?ACTIVITY LIMITATIONS community activity.  ? ?PERSONAL FACTORS 3+ comorbidities: Hyperlipidemia, impaired glucose tolerance, hypertension, OSA with CPAP  are also affecting patient's functional outcome.  ? ? ?REHAB POTENTIAL:  Good ? ?CLINICAL DECISION MAKING: Evolving/moderate complexity ? ?EVALUATION COMPLEXITY: Moderate ? ? ?GOALS: ?Goals reviewed with patient? Yes ? ?SHORT TERM GOALS: Target date: 05/14/2021 ? ?  Pt will be indepenent with initial HEP. ?Baseline:  ?Goal status: MET ? ? ?LONG TERM GOALS: Target date: 07/29/2021 ? ?Pt will be independent with advanced HEP. ?Baseline:  ?Goal status: IN PROGRESS ? ?2.  Pt will increase FOTO to at least 63% to demonstrate increased functional mobility. ?Baseline: 57% ?Goal status: IN PROGRESS ? ?3.  Pt will increase L hip and knee strength to at least 4+/5 to allow her to play with her grandson. ?Baseline: LLE 4-/5 ?Goal status: IN PROGRESS ? ?4.  Pt will report being able to negotiate the stairs in her home with railing and reciprocal pattern without increased pain. ?Baseline:  ?Goal status: IN PROGRESS ? ?5.  Pt will be able to perform single leg stance for at least 10 seconds to demonstrate decreased risk of falling. ?Baseline:  ?Goal status: IN PROGRESS ? ? ?PLAN: ?PT FREQUENCY: 1-2x/week ? ?PT DURATION: 8 weeks ? ?PLANNED INTERVENTIONS: Therapeutic exercises, Therapeutic activity, Neuromuscular re-education, Balance training, Gait training, Patient/Family education, Joint manipulation, Joint mobilization, Stair training, Aquatic Therapy, Dry Needling, Spinal manipulation, Spinal mobilization, Cryotherapy, Moist heat, Taping, Vasopneumatic device, Traction, Ultrasound, Ionotophoresis 20m/ml Dexamethasone, and Manual therapy. ? ?PLAN FOR NEXT SESSION: Update HEP as indicated, strengthening, core stability ? ? ?SShelby DubinMenke, PT ?06/03/2021, 1:01 PM  ? ?[facility]?[address] ?[place] [address]?Phone # [phone]?Fax [phone]? ?

## 2021-06-10 ENCOUNTER — Encounter: Payer: Self-pay | Admitting: Rehabilitative and Restorative Service Providers"

## 2021-06-10 ENCOUNTER — Ambulatory Visit: Payer: Medicare Other | Admitting: Rehabilitative and Restorative Service Providers"

## 2021-06-10 ENCOUNTER — Other Ambulatory Visit: Payer: Self-pay

## 2021-06-10 DIAGNOSIS — R252 Cramp and spasm: Secondary | ICD-10-CM

## 2021-06-10 DIAGNOSIS — R262 Difficulty in walking, not elsewhere classified: Secondary | ICD-10-CM

## 2021-06-10 DIAGNOSIS — M6281 Muscle weakness (generalized): Secondary | ICD-10-CM | POA: Diagnosis not present

## 2021-06-10 NOTE — Therapy (Signed)
?OUTPATIENT PHYSICAL THERAPY TREATMENT NOTE ? ? ?Patient Name: Vanessa Price ?MRN: [id number] ?DOB:[birth year], 66 y.o., female ?Today's Date: 06/10/2021 ? ?PCP: [name], [name], MD ?REFERRING PROVIDER: [name], Estel* ? ? PT End of Session - 06/10/21 1147   ? ? Visit Number 9   ? Date for PT Re-Evaluation 08/02/21   ? Authorization Type [employer] Medicare   ? Progress Note Due on Visit 10   ? PT Start Time 1145   ? PT Stop Time 1225   ? PT Time Calculation (min) 40 min   ? Activity Tolerance Patient tolerated treatment well;No increased pain   ? Behavior During Therapy [facility] for tasks assessed/performed   ? ?  ?  ? ?  ? ? ?Past Medical History:  ?Diagnosis Date  ? ANXIETY 06/04/2009  ? GERD 08/14/2008  ? occasional  ? Hx of adenomatous polyp of colon 03/11/2018  ? HYPERLIPIDEMIA 09/14/2006  ? HYPERTENSION 09/14/2006  ? Impaired glucose tolerance   ? MYOCARDIAL PERFUSION SCAN, WITH STRESS TEST, ABNORMAL 10/19/2008  ? PREMATURE VENTRICULAR CONTRACTIONS 08/14/2008  ? ?Past Surgical History:  ?Procedure Laterality Date  ? ABDOMINAL HYSTERECTOMY  1999  ? menorrhagia/partial  ? CESAREAN SECTION    ? 2 times  ? ?Patient Active Problem List  ? Diagnosis Date Noted  ? OSA on CPAP 05/30/2021  ? Moderate obstructive sleep apnea-hypopnea syndrome 02/25/2021  ? Obesity (BMI 30.0-34.9) 01/17/2021  ? Snoring 01/17/2021  ? Vitamin D deficiency 10/26/2018  ? Hx of adenomatous polyp of colon 03/11/2018  ? Constipation 02/10/2018  ? Impaired glucose tolerance 10/02/2015  ? ANXIETY 06/04/2009  ? MYOCARDIAL PERFUSION SCAN, WITH STRESS TEST, ABNORMAL 10/19/2008  ? PREMATURE VENTRICULAR CONTRACTIONS 08/14/2008  ? GERD 08/14/2008  ? Goiter 01/04/2008  ? Dyslipidemia 09/14/2006  ? Essential hypertension 09/14/2006  ? ? ?REFERRING DIAG: M54.42,G89.29 (ICD-10-CM) - Chronic left-sided low back pain with left-sided sciatica ? ?THERAPY DIAG:  ?Muscle weakness (generalized) ? ?Difficulty in walking, not elsewhere  classified ? ?Cramp and spasm ? ?PERTINENT HISTORY: Hyperlipidemia, impaired glucose tolerance, hypertension, obesity.  She was recently diagnosed with obstructive sleep apnea and is using nightly CPAP. ? ?PRECAUTIONS: None ? ?SUBJECTIVE: Pt reports having increased shoulder pain today. Pt states that she has been out walking more. ? ?PAIN:  ?Are you having pain? Yes: NPRS scale: 1-2/10 ?Pain location: lumbar ?Pain description: dull ?Aggravating factors: increased activity ?Relieving factors: rest ? ?PATIENT GOALS: Pt would ike to be able to get up and walk without pain and numbness. ? ?OBJECTIVE:  ?  ?DIAGNOSTIC FINDINGS:  ?radiographs with Dr [name], pt reports revealed OA of back ?  ?PATIENT SURVEYS:  ?FOTO 57% on 04/16/2021 (Projected 63% by visit 11) ?06/03/2021: 60% ?  ?COGNITION: ?          Overall cognitive status: Within functional limits for tasks assessed               ?           ?LE MMT: ?  ?06/03/2021:  LLE hip and knee strength of grossly 4/5 throughout. ?  ?FUNCTIONAL TESTS: ?06/03/2021 Single Leg Stance x10 sec on RLE, x4 sec on LLE ?  ?TODAY'S TREATMENT  ?  ?06/10/2021: ?Nustep L5 x8 min with PT present to discuss status ?3 way green physioball roll out 10 sec hold x5 each ?Seated on green physioball:  LAQ, marching 2x10 each ?Sit to/from stand holding 3#:  x10 with chest press, x10 with overhead press ?Standing at bar: 3 way  hip with green loop x10 bilat. ?Seated rows 15# 2x10 ?Lat pull 25# 2x10 ?Step ups fwd and lateral 6" step x10 bilat with UE support ?Seated piriformis stretch 2x20 sec hold bilat. ?Wall push ups 2x10 ? ?06/03/2021: ?Nustep L5 x8 min with PT present to discuss status ?Standing hamstring and hip flexor stretch at steps 2x20 sec Bilat ?Hooklying piriformis stretch 2x20 sec bilat ?Supine single knee to chest 20 sec x1 rep bilat ?Lower Trunk Rotation 4x10 sec hold bilat ?Prone press up 2x10 ?Hooklying clam with 2x10 reps with yellow loop ?Bridging 2x10 ?Straight leg raise on mat  (flexion, abduction, extension) 2.5# 2x10 bilat ?  ?  ?PATIENT EDUCATION:  ?Education details: Pt issued HEP ?Person educated: Patient ?Education method: Explanation and Demonstration ?Education comprehension: verbalized understanding and returned demonstration ?  ?  ?HOME EXERCISE PROGRAM: ?Access Code: [email or web address] ?URL: [email or web address] ?Date: 06/03/2021 ?Prepared by: Juel Burrow ?  ?Exercises ?Standing Hamstring Stretch with Step - 1-2 x daily - 7 x weekly - 1 sets - 2 reps - 20 sec hold ?Hip Flexor Stretch on Step - 1-2 x daily - 7 x weekly - 1 sets - 2 reps - 15 sec hold ?Seated Sciatic Tensioner - 1-2 x daily - 7 x weekly - 1 sets - 3 reps - 10 sec hold ?Supine Figure 4 Piriformis Stretch - 1 x daily - 7 x weekly - 1 sets - 3 reps - 10 hold ?Hooklying Single Knee to Chest Stretch - 1 x daily - 7 x weekly - 1 sets - 3 reps - 10 hold ?Supine Piriformis Stretch with Foot on Ground - 1 x daily - 7 x weekly - 1 sets - 3 reps - 10 hold ?Seated Long Arc Quad - 1 x daily - 7 x weekly - 2 sets - 10 reps ?Sit to Stand - 1 x daily - 7 x weekly - 1 sets - 10 reps ?  ?  ?ASSESSMENT: ?  ?CLINICAL IMPRESSION: ?Pt presents to PT stating that her back is feeling better and states that she got a physioball for at home.  Reviewed some exercises that pt could perform on physioball.  Pt able to progress exercises today and only requires minimal cuing for technique.  Pt continues to require skilled PT to further progress towards goal related activities. ?  ?  ?OBJECTIVE IMPAIRMENTS decreased balance, difficulty walking, decreased strength, impaired flexibility, postural dysfunction, and pain.  ?  ?ACTIVITY LIMITATIONS community activity.  ?  ?PERSONAL FACTORS 3+ comorbidities: Hyperlipidemia, impaired glucose tolerance, hypertension, OSA with CPAP  are also affecting patient's functional outcome.  ?  ?  ?REHAB POTENTIAL: Good ?  ?CLINICAL DECISION MAKING: Evolving/moderate complexity ?  ?EVALUATION COMPLEXITY:  Moderate ?  ?  ?GOALS: ?Goals reviewed with patient? Yes ?  ?SHORT TERM GOALS: Target date: 05/14/2021 ?  ?Pt will be indepenent with initial HEP. ?Baseline:  ?Goal status: MET ?  ?  ?LONG TERM GOALS: Target date: 07/29/2021 ?  ?Pt will be independent with advanced HEP. ?Baseline:  ?Goal status: IN PROGRESS ?  ?2.  Pt will increase FOTO to at least 63% to demonstrate increased functional mobility. ?Baseline: 57% ?Goal status: IN PROGRESS ?  ?3.  Pt will increase L hip and knee strength to at least 4+/5 to allow her to play with her grandson. ?Baseline: LLE 4-/5 ?Goal status: IN PROGRESS ?  ?4.  Pt will report being able to negotiate the stairs in her home with railing and reciprocal pattern without increased pain. ?Baseline:  ?  Goal status: IN PROGRESS ?  ?5.  Pt will be able to perform single leg stance for at least 10 seconds to demonstrate decreased risk of falling. ?Baseline:  ?Goal status: IN PROGRESS ?  ?  ?PLAN: ?PT FREQUENCY: 1-2x/week ?  ?PT DURATION: 8 weeks ?  ?PLANNED INTERVENTIONS: Therapeutic exercises, Therapeutic activity, Neuromuscular re-education, Balance training, Gait training, Patient/Family education, Joint manipulation, Joint mobilization, Stair training, Aquatic Therapy, Dry Needling, Spinal manipulation, Spinal mobilization, Cryotherapy, Moist heat, Taping, Vasopneumatic device, Traction, Ultrasound, Ionotophoresis 58m/ml Dexamethasone, and Manual therapy. ?  ?PLAN FOR NEXT SESSION: Update HEP as indicated, strengthening, core stability ? ?Pt second cert returned signed by MD on 06/03/2021 by Dr HJerilee Hoh ?  ? ? ?SJuel Burrow PT ?06/10/2021, 12:31 PM ? ?  ? ?

## 2021-06-17 ENCOUNTER — Ambulatory Visit: Payer: Medicare Other | Admitting: Rehabilitative and Restorative Service Providers"

## 2021-06-17 ENCOUNTER — Other Ambulatory Visit: Payer: Self-pay

## 2021-06-17 ENCOUNTER — Encounter: Payer: Self-pay | Admitting: Rehabilitative and Restorative Service Providers"

## 2021-06-17 DIAGNOSIS — M6281 Muscle weakness (generalized): Secondary | ICD-10-CM

## 2021-06-17 DIAGNOSIS — R262 Difficulty in walking, not elsewhere classified: Secondary | ICD-10-CM

## 2021-06-17 DIAGNOSIS — R252 Cramp and spasm: Secondary | ICD-10-CM

## 2021-06-17 NOTE — Therapy (Addendum)
?OUTPATIENT PHYSICAL THERAPY TREATMENT NOTE AND LATE ENTRY DISCHARGE SUMMARY ? ? ?Patient Name: Vanessa Price ?MRN: [id number] ?DOB:10-Jan-1956, 66 y.o., female ?Today's Date: 06/17/2021 ? ?PCP: [name], [name], MD ?REFERRING PROVIDER: [name], Estel* ? ? PT End of Session - 06/17/21 1146   ? ? Visit Number 10   ? Date for PT Re-Evaluation 08/02/21   ? Authorization Type [employer] Medicare   ? PT Start Time 1143   ? PT Stop Time 1225   ? PT Time Calculation (min) 42 min   ? Activity Tolerance Patient tolerated treatment well;No increased pain   ? Behavior During Therapy [facility] for tasks assessed/performed   ? ?  ?  ? ?  ? ? ?Past Medical History:  ?Diagnosis Date  ? ANXIETY 06/04/2009  ? GERD 08/14/2008  ? occasional  ? Hx of adenomatous polyp of colon 03/11/2018  ? HYPERLIPIDEMIA 09/14/2006  ? HYPERTENSION 09/14/2006  ? Impaired glucose tolerance   ? MYOCARDIAL PERFUSION SCAN, WITH STRESS TEST, ABNORMAL 10/19/2008  ? PREMATURE VENTRICULAR CONTRACTIONS 08/14/2008  ? ?Past Surgical History:  ?Procedure Laterality Date  ? ABDOMINAL HYSTERECTOMY  1999  ? menorrhagia/partial  ? CESAREAN SECTION    ? 2 times  ? ?Patient Active Problem List  ? Diagnosis Date Noted  ? OSA on CPAP 05/30/2021  ? Moderate obstructive sleep apnea-hypopnea syndrome 02/25/2021  ? Obesity (BMI 30.0-34.9) 01/17/2021  ? Snoring 01/17/2021  ? Vitamin D deficiency 10/26/2018  ? Hx of adenomatous polyp of colon 03/11/2018  ? Constipation 02/10/2018  ? Impaired glucose tolerance 10/02/2015  ? ANXIETY 06/04/2009  ? MYOCARDIAL PERFUSION SCAN, WITH STRESS TEST, ABNORMAL 10/19/2008  ? PREMATURE VENTRICULAR CONTRACTIONS 08/14/2008  ? GERD 08/14/2008  ? Goiter 01/04/2008  ? Dyslipidemia 09/14/2006  ? Essential hypertension 09/14/2006  ? ? ?REFERRING DIAG: M54.42,G89.29 (ICD-10-CM) - Chronic left-sided low back pain with left-sided sciatica ? ?THERAPY DIAG:  ?Muscle weakness (generalized) ? ?Difficulty in walking, not elsewhere  classified ? ?Cramp and spasm ? ?PERTINENT HISTORY: Hyperlipidemia, impaired glucose tolerance, hypertension, obesity.  She was recently diagnosed with obstructive sleep apnea and is using nightly CPAP. ? ?PRECAUTIONS: None ? ?SUBJECTIVE: Pt reports feeling good and not having any pain today. ? ?PAIN:  ?Are you having pain? Yes: NPRS scale: 0/10 ?Pain location: lumbar ?Pain description: dull ?Aggravating factors: increased activity ?Relieving factors: rest ? ?PATIENT GOALS: Pt would ike to be able to get up and walk without pain and numbness. ? ?OBJECTIVE:  ?  ?DIAGNOSTIC FINDINGS:  ?radiographs with Dr [name], pt reports revealed OA of back ?  ?PATIENT SURVEYS:  ?FOTO 57% on 04/16/2021 (Projected 63% by visit 11) ?06/03/2021: 60% ?06/17/2021:  79% ?  ?COGNITION: ?          Overall cognitive status: Within functional limits for tasks assessed               ?           ?LE MMT: ?  ?06/03/2021:  LLE hip and knee strength of grossly 4/5 throughout. ?  ?FUNCTIONAL TESTS: ?06/03/2021 Single Leg Stance x10 sec on RLE, x4 sec on LLE ?06/17/2021 Single Leg Stance x20 sec on RLE, x19 sec on LLE ?  ?TODAY'S TREATMENT  ?  ?06/17/2021: ?Nustep L5 x8 min with PT present to discuss status ?3 way green physioball roll out 10 sec hold x5 each ?Seated on green physioball:  LAQ and marching 2x10 each ?Sit to/from stand holding 3#:  x10 with chest press, x10 with overhead  press ?Standing at bar: 3 way hip with green loop x10 bilat. ?Lat pull 30# 2x10 ?Step ups fwd and lateral 6" step x10 bilat with UE support ?Wall push ups 2x10 ?Seated rows 15# 2x10 ? ?06/10/2021: ?Nustep L5 x8 min with PT present to discuss status ?3 way green physioball roll out 10 sec hold x5 each ?Seated on green physioball:  LAQ, marching 2x10 each ?Sit to/from stand holding 3#:  x10 with chest press, x10 with overhead press ?Standing at bar: 3 way hip with green loop x10 bilat. ?Seated rows 15# 2x10 ?Lat pull 25# 2x10 ?Step ups fwd and lateral 6" step x10 bilat  with UE support ?Seated piriformis stretch 2x20 sec hold bilat. ?Wall push ups 2x10 ? ?06/03/2021: ?Nustep L5 x8 min with PT present to discuss status ?Standing hamstring and hip flexor stretch at steps 2x20 sec Bilat ?Hooklying piriformis stretch 2x20 sec bilat ?Supine single knee to chest 20 sec x1 rep bilat ?Lower Trunk Rotation 4x10 sec hold bilat ?Prone press up 2x10 ?Hooklying clam with 2x10 reps with yellow loop ?Bridging 2x10 ?Straight leg raise on mat (flexion, abduction, extension) 2.5# 2x10 bilat ?  ?  ?PATIENT EDUCATION:  ?Education details: Pt issued HEP ?Person educated: Patient ?Education method: Explanation and Demonstration ?Education comprehension: verbalized understanding and returned demonstration ?  ?  ?HOME EXERCISE PROGRAM: ?Access Code: [email or web address] ?URL: [email or web address] ?Date: 06/03/2021 ?Prepared by: Juel Burrow ?  ?Exercises ?Standing Hamstring Stretch with Step - 1-2 x daily - 7 x weekly - 1 sets - 2 reps - 20 sec hold ?Hip Flexor Stretch on Step - 1-2 x daily - 7 x weekly - 1 sets - 2 reps - 15 sec hold ?Seated Sciatic Tensioner - 1-2 x daily - 7 x weekly - 1 sets - 3 reps - 10 sec hold ?Supine Figure 4 Piriformis Stretch - 1 x daily - 7 x weekly - 1 sets - 3 reps - 10 hold ?Hooklying Single Knee to Chest Stretch - 1 x daily - 7 x weekly - 1 sets - 3 reps - 10 hold ?Supine Piriformis Stretch with Foot on Ground - 1 x daily - 7 x weekly - 1 sets - 3 reps - 10 hold ?Seated Long Arc Quad - 1 x daily - 7 x weekly - 2 sets - 10 reps ?Sit to Stand - 1 x daily - 7 x weekly - 1 sets - 10 reps ?  ?  ?ASSESSMENT: ?  ?CLINICAL IMPRESSION: ?Pt presents to PT stating that her back is feeling better continuing to report feeling better and has 0/10 pain today.  Pt has made improvements with FOTO score and single leg stance.  Pt has nearly met all goals and may be ready for discharge next session if all goals met and pt continues with decreased pain.  Pt continues to require skilled PT  to progress towards goal related activities. ?  ?  ?OBJECTIVE IMPAIRMENTS decreased balance, difficulty walking, decreased strength, impaired flexibility, postural dysfunction, and pain.  ?  ?ACTIVITY LIMITATIONS community activity.  ?  ?PERSONAL FACTORS 3+ comorbidities: Hyperlipidemia, impaired glucose tolerance, hypertension, OSA with CPAP  are also affecting patient's functional outcome.  ?  ?  ?REHAB POTENTIAL: Good ?  ?CLINICAL DECISION MAKING: Evolving/moderate complexity ?  ?EVALUATION COMPLEXITY: Moderate ?  ?  ?GOALS: ?Goals reviewed with patient? Yes ?  ?SHORT TERM GOALS: Target date: 05/14/2021 ?  ?Pt will be indepenent with initial HEP. ?Baseline:  ?Goal status: MET ?  ?  ?LONG  TERM GOALS: Target date: 07/29/2021 ?  ?Pt will be independent with advanced HEP. ?Baseline:  ?Goal status: IN PROGRESS ?  ?2.  Pt will increase FOTO to at least 63% to demonstrate increased functional mobility. ?Baseline: 57% ?Goal status: GOAL MET ?  ?3.  Pt will increase L hip and knee strength to at least 4+/5 to allow her to play with her grandson. ?Baseline: LLE 4-/5 ?Goal status: IN PROGRESS ?  ?4.  Pt will report being able to negotiate the stairs in her home with railing and reciprocal pattern without increased pain. ?Baseline:  ?Goal status: IN PROGRESS ?  ?5.  Pt will be able to perform single leg stance for at least 10 seconds to demonstrate decreased risk of falling. ?Baseline:  ?Goal status: GOAL MET ?  ?  ?PLAN: ?PT FREQUENCY: 1-2x/week ?  ?PT DURATION: 8 weeks ?  ?PLANNED INTERVENTIONS: Therapeutic exercises, Therapeutic activity, Neuromuscular re-education, Balance training, Gait training, Patient/Family education, Joint manipulation, Joint mobilization, Stair training, Aquatic Therapy, Dry Needling, Spinal manipulation, Spinal mobilization, Cryotherapy, Moist heat, Taping, Vasopneumatic device, Traction, Ultrasound, Ionotophoresis 7m/ml Dexamethasone, and Manual therapy. ?  ?PLAN FOR NEXT SESSION: Possible DC if  goals met, Update HEP as indicated, strengthening, core stability ? ?Pt second cert returned signed by MD on 06/03/2021 by Dr HJerilee Hoh ?  ? ?PHYSICAL THERAPY DISCHARGE SUMMARY ? ?As of 08/17/2021, pt has not returned f

## 2021-06-24 ENCOUNTER — Encounter: Payer: Medicare Other | Admitting: Rehabilitative and Restorative Service Providers"

## 2021-06-27 NOTE — Progress Notes (Signed)
[employer], Luna machine- 84% compliance. Highly variable air leak data but good AHI results day by day.   ? ?FYI to PCP ?

## 2021-07-01 DIAGNOSIS — G4733 Obstructive sleep apnea (adult) (pediatric): Secondary | ICD-10-CM | POA: Diagnosis not present

## 2021-07-05 DIAGNOSIS — G4733 Obstructive sleep apnea (adult) (pediatric): Secondary | ICD-10-CM | POA: Diagnosis not present

## 2021-07-08 ENCOUNTER — Ambulatory Visit: Payer: Medicare Other | Admitting: Rehabilitative and Restorative Service Providers"

## 2021-07-15 ENCOUNTER — Telehealth: Payer: Medicare Other

## 2021-07-15 ENCOUNTER — Ambulatory Visit: Payer: Medicare Other | Admitting: Internal Medicine

## 2021-07-31 DIAGNOSIS — G4733 Obstructive sleep apnea (adult) (pediatric): Secondary | ICD-10-CM | POA: Diagnosis not present

## 2021-08-05 ENCOUNTER — Other Ambulatory Visit: Payer: Self-pay | Admitting: Internal Medicine

## 2021-08-22 ENCOUNTER — Other Ambulatory Visit: Payer: Self-pay | Admitting: Internal Medicine

## 2021-08-22 ENCOUNTER — Other Ambulatory Visit: Payer: Self-pay

## 2021-08-22 MED ORDER — REPATHA SURECLICK 140 MG/ML ~~LOC~~ SOAJ: SUBCUTANEOUS | 11 refills | Status: DC

## 2021-08-31 DIAGNOSIS — G4733 Obstructive sleep apnea (adult) (pediatric): Secondary | ICD-10-CM | POA: Diagnosis not present

## 2021-09-05 NOTE — Progress Notes (Signed)
Chronic Care Management Pharmacy Note  09/09/2021 Name:  Vanessa Price MRN:  [id number] DOB:  1955/12/17  Summary: TGs close to goal of < 150 BP not at goal < 140/90  Recommendations/Changes made from today's visit: -Recommended monitoring BP at home a few days a week -Recommended to bring BP cuff to next office visit to ensure accuracy  Plan: BP assessment in 3 months Follow up in 6 months  Subjective: Vanessa Price is an 66 y.o. year old female who is a primary patient of [name], [name], MD.  The CCM team was consulted for assistance with disease management and care coordination needs.    Engaged with patient by telephone for follow up visit in response to provider referral for pharmacy case management and/or care coordination services.   Consent to Services:  The patient was given information about Chronic Care Management services, agreed to services, and gave verbal consent prior to initiation of services.  Please see initial visit note for detailed documentation.   Patient Care Team: [name], [name], MD as PCP - General (Internal Medicine) Viona Gilmore, [facility] as Pharmacist (Pharmacist)  Recent office visits: 04/15/21 Domingo Mend, MD: Patient presented for leg pain. Referred to PT. BP elevated in office and recommended 3 month follow up.  Recent consult visits: 06/17/21 Juel Burrow, PT (outpatient rehab): Patient presented for PT treatment for muscle weakness.  05/30/21 Larey Seat, MD (neurology): Patient presented for OSA initial visit. Continue with CPAP use and recommended avoiding sleeping on back. Follow up in 1 year.  02/04/2021 [name] MD (cardiology) - Patient was seen for dyslipidemia and additional issues. No medication changes. Follow up in 12 months.  Hospital visits: None in previous 6 months  Objective:  Lab Results  Component Value Date   CREATININE 0.81 12/17/2020   BUN 13 12/17/2020    GFR 63.09 08/31/2020   GFRNONAA >60 12/17/2020   GFRAA 96 01/04/2008   NA 137 12/17/2020   K 4.0 12/17/2020   CALCIUM 9.4 12/17/2020   CO2 23 12/17/2020   GLUCOSE 102 (H) 12/17/2020    Lab Results  Component Value Date/Time   HGBA1C 6.1 (A) 04/15/2021 08:49 AM   HGBA1C 6.2 08/31/2020 08:05 AM   HGBA1C 5.9 (A) 05/31/2020 08:36 AM   HGBA1C 6.3 10/26/2018 08:03 AM   GFR 63.09 08/31/2020 08:05 AM   GFR 64.85 05/31/2020 08:54 AM    Last diabetic Eye exam: No results found for: "HMDIABEYEEXA"  Last diabetic Foot exam: No results found for: "HMDIABFOOTEX"   Lab Results  Component Value Date   CHOL 147 01/28/2021   HDL 44 01/28/2021   LDLCALC 75 01/28/2021   LDLDIRECT 41.0 05/31/2020   TRIG 162 (H) 01/28/2021   CHOLHDL 3.3 01/28/2021       Latest Ref Rng & Units 12/17/2020   11:36 AM 08/31/2020    8:05 AM 05/31/2020    8:54 AM  Hepatic Function  Total Protein 6.5 - 8.1 g/dL 7.4  6.8  7.0   Albumin 3.5 - 5.0 g/dL 4.4  4.5  4.5   AST 15 - 41 U/L _0 ALT 0 - 44 U/L 26  20  36   Alk Phosphatase 38 - 126 U/L 49  37  44   Total Bilirubin 0.3 - 1.2 mg/dL 0.5  0.4  0.4     Lab Results  Component Value Date/Time   TSH 2.89 08/31/2020 08:05 AM   TSH 1.65 10/26/2018 08:03  AM       Latest Ref Rng & Units 12/17/2020   11:36 AM 08/31/2020    8:05 AM 10/26/2018    8:03 AM  CBC  WBC 4.0 - 10.5 K/uL 5.1  4.5  4.8   Hemoglobin 12.0 - 15.0 g/dL 14.3  13.5  13.6   Hematocrit 36.0 - 46.0 % 44.6  40.4  41.8   Platelets 150 - 400 K/uL 242  180.0  230.0     Lab Results  Component Value Date/Time   VD25OH 38.47 08/31/2020 08:05 AM   VD25OH 37.47 02/10/2019 07:38 AM    Clinical ASCVD: No  The 10-year ASCVD risk score (Arnett DK, et al., 2019) is: 9.8%   Values used to calculate the score:     Age: 77 years     Sex: Female     Is Non-Hispanic African American: Yes     Diabetic: No     Tobacco smoker: No     Systolic Blood Pressure: 338 mmHg     Is BP treated: Yes      HDL Cholesterol: 44 mg/dL     Total Cholesterol: 147 mg/dL       04/15/2021    8:26 AM 08/31/2020    7:24 AM 10/26/2018    7:05 AM  Depression screen PHQ 2/9  Decreased Interest 0 1 0  Down, Depressed, Hopeless 0 1 0  PHQ - 2 Score 0 2 0  Altered sleeping  1 1  Tired, decreased energy  0 0  Change in appetite  0 0  Feeling bad or failure about yourself   0 0  Trouble concentrating  0 0  Moving slowly or fidgety/restless  0 0  Suicidal thoughts  0 0  PHQ-9 Score  3 1  Difficult doing work/chores  Not difficult at all Not difficult at all      Social History   Tobacco Use  Smoking Status Former   Types: Cigarettes   Quit date: 03/24/1993   Years since quitting: 28.4  Smokeless Tobacco Never   BP Readings from Last 3 Encounters:  05/30/21 (!) 141/80  04/15/21 (!) 168/100  02/04/21 136/82   Pulse Readings from Last 3 Encounters:  05/30/21 80  04/15/21 86  02/04/21 86   Wt Readings from Last 3 Encounters:  05/30/21 198 lb (89.8 kg)  04/15/21 200 lb (90.7 kg)  02/04/21 200 lb 12.8 oz (91.1 kg)   BMI Readings from Last 3 Encounters:  05/30/21 33.99 kg/m  04/15/21 34.33 kg/m  02/04/21 34.47 kg/m    Assessment/Interventions: Review of patient past medical history, allergies, medications, health status, including review of consultants reports, laboratory and other test data, was performed as part of comprehensive evaluation and provision of chronic care management services.   SDOH:  (Social Determinants of Health) assessments and interventions performed: No SDOH Interventions    Flowsheet Row Most Recent Value  SDOH Interventions   Financial Strain Interventions Intervention Not Indicated      SDOH Screenings   Alcohol Screen: Not on file  Depression (PHQ2-9): Low Risk  (04/15/2021)   Depression (PHQ2-9)    PHQ-2 Score: 0  Financial Resource Strain: Low Risk  (09/09/2021)   Overall Financial Resource Strain (CARDIA)    Difficulty of Paying Living Expenses:  Not hard at all  Food Insecurity: Not on file  Housing: Not on file  Physical Activity: Not on file  Social Connections: Not on file  Stress: Not on file  Tobacco Use: Medium Risk (  06/17/2021)   Patient History    Smoking Tobacco Use: Former    Smokeless Tobacco Use: Never    Passive Exposure: Not on file  Transportation Needs: No Transportation Needs (10/18/2019)   PRAPARE - Hydrologist (Medical): No    Lack of Transportation (Non-Medical): No    CCM Care Plan  Allergies  Allergen Reactions   Lisinopril     REACTION: cough   Risperidone     REACTION: unsure of dose; pt describes reaction of bad cough    Medications Reviewed Today     Reviewed by Viona Gilmore, [facility] (Pharmacist) on 09/09/21 at [id number]  Med List Status: <None>   Medication Order Taking? Sig Documenting Provider Last Dose Status Informant  Evolocumab (REPATHA SURECLICK) 160 MG/ML SOAJ [id number]  INJECT 1 DOSE INTO THE SKIN EVERY 14 DAYS Pixie Casino, MD  Active   losartan-hydrochlorothiazide [facility]) 50-12.5 MG tablet [id number] Yes TAKE 1 TABLET BY MOUTH  DAILY [name], [name], MD Taking Active   Multiple Vitamins-Minerals (ONE DAILY WOMENS 50 PLUS) TABS [id number]  Take by mouth. Centrum Womens 50 plus MVI [provider]  Active   naproxen sodium (ALEVE) 220 MG tablet [id number]  Take 220 mg by mouth as needed.  [provider]  Active   omega-3 acid ethyl esters (LOVAZA) 1 g capsule [id number]  TAKE 2 CAPSULES BY MOUTH  TWICE DAILY Hilty, Nadean Corwin, MD  Active             Patient Active Problem List   Diagnosis Date Noted   OSA on CPAP 05/30/2021   Moderate obstructive sleep apnea-hypopnea syndrome 02/25/2021   Obesity (BMI 30.0-34.9) 01/17/2021   Snoring 01/17/2021   Vitamin D deficiency 10/26/2018   Hx of adenomatous polyp of colon 03/11/2018   Constipation 02/10/2018   Impaired glucose tolerance 10/02/2015   ANXIETY 06/04/2009    MYOCARDIAL PERFUSION SCAN, WITH STRESS TEST, ABNORMAL 10/19/2008   PREMATURE VENTRICULAR CONTRACTIONS 08/14/2008   GERD 08/14/2008   Goiter 01/04/2008   Dyslipidemia 09/14/2006   Essential hypertension 09/14/2006    Immunization History  Administered Date(s) Administered   Influenza Split 12/24/2010, 12/17/2011   Influenza,inj,Quad PF,6+ Mos 01/07/2013   PFIZER(Purple Top)SARS-COV-2 Vaccination 07/19/2019, 08/09/2019, 02/21/2020   Tdap 08/17/2014   Zoster Recombinat (Shingrix) 10/26/2018, 02/10/2019   Patient reported that she is doing well right now. She reported that her Repatha is more expensive when she is in the donut hole but is not right now. She did inquire about patient assistance for Repatha. Patient has noticed cramping with use of fenofibrate and inquired about stopping. Discussed how this would likely impact her triglycerides but could consider switching fish oil to Vascepa for further benefit. Reached out to cardiologist.  Patient inquired about about getting a new BP cuff and recommended omron brand.  Conditions to be addressed/monitored:  Hypertension, Hyperlipidemia and constipation, pain, pre-diabetes  Conditions to be addressed the visit: Hyperlipidemia, hypertension  Care Plan : CCM Pharmacy Care Plan  Updates made by Viona Gilmore, [place] since 09/09/2021 12:00 AM     Problem: Problem: Hypertension, Hyperlipidemia and constipation, pain, pre-diabetes      Long-Range Goal: Patient-Specific Goal   Start Date: 06/14/2020  Expected End Date: 06/14/2021  Recent Progress: On track  Priority: High  Note:   Current Barriers:  Unable to independently monitor therapeutic efficacy  Pharmacist Clinical Goal(s):  Patient will achieve control of triglycerides as evidenced by next lipid panel  through collaboration  with PharmD and provider.   Interventions: 1:1 collaboration with [name], [name], MD regarding development and update of comprehensive plan of  care as evidenced by provider attestation and co-signature Inter-disciplinary care team collaboration (see longitudinal plan of care) Comprehensive medication review performed; medication list updated in electronic medical record  Hypertension (BP goal <140/90) -Not ideally controlled -Current treatment: Losartan-hydrochlorothiazide 50-75m, 1 tablet once daily - Appropriate, Query effective, Safe, Accessible -Medications previously tried: olmesartan (cost)  -Current home readings: not checking regularly at home; has an arm cuff that stopped working -Current dietary habits: did not discuss -Current exercise habits: walking 2 miles a day 4 days a week -Denies hypotensive/hypertensive symptoms -Educated on Exercise goal of 150 minutes per week; Importance of home blood pressure monitoring; -Counseled to monitor BP at home weekly, document, and provide log at future appointments -Counseled on diet and exercise extensively Recommended to continue current medication  Hyperlipidemia: (LDL goal < 100) -Controlled -Current treatment: Repatha 140 mg inject every 14 days - Appropriate, Effective, Safe, Accessible Lovaza 1 g 4 capsules once daily - Appropriate, Query effective, Safe, Accessible -Medications previously tried: atorvastatin, pravastatin, ezetimibe, Nexletol (no longer needed)  -Current dietary patterns: limiting fatty foods; does admit to overeating often -Current exercise habits: walking 4 days a week -Educated on Cholesterol goals;  Importance of limiting foods high in cholesterol; Exercise goal of 150 minutes per week; -Recommended to continue current medication  Pre-diabetes (A1c goal <6.5%) -Controlled -Current medications: No medications -Medications previously tried: none  -Current home glucose readings fasting glucose: n/a post prandial glucose: n/a -Denies hypoglycemic/hyperglycemic symptoms -Current meal patterns:  breakfast: did not discuss  lunch: did not  discuss   dinner: did not discuss  snacks: did not discuss  drinks: did not discuss  -Current exercise: walking 4 days a week -Educated on A1c and blood sugar goals; Exercise goal of 150 minutes per week; Carbohydrate counting and/or plate method -Counseled to check feet daily and get yearly eye exams -Counseled on diet and exercise extensively Congratulated patient on improved A1c  Constipation (Goal: regular bowel movements) -Controlled -Current treatment  Miralax as needed - primarily using this - Appropriate, Effective, Safe, Accessible Colace as needed - Appropriate, Effective, Safe, Accessible Psyllium powder, 2 tablespoons daily as needed - Appropriate, Effective, Safe, Accessible -Medications previously tried: n/a  -Counseled on increasing water intake and fiber intake  Pain (Goal: minimize symptoms) -Controlled -Current treatment  Naproxen 2252m 2 tablets as needed - Appropriate, Effective, Safe, Accessible -Medications previously tried: n/a  -Recommended to continue current medication  Vitamin D deficiency (Goal: vitamin D 30-100) -Controlled -Current treatment  None  -Medications previously tried: none  -Recommended repeat vitamin D level  Health Maintenance -Vaccine gaps: Prevnar, influenza - patient declines both vaccines at this time -Current therapy:  Multivitamin 1 tablet daily Probiotics daily -Educated on Cost vs benefit of each product must be carefully weighed by individual consumer -Patient is satisfied with current therapy and denies issues -Recommended to continue current medication  Patient Goals/Self-Care Activities Patient will:  - check blood pressure weekly, document, and provide at future appointments target a minimum of 150 minutes of moderate intensity exercise weekly engage in dietary modifications by increasing fiber intake and avoid overeating  Follow Up Plan: Telephone follow up appointment with care management team member  scheduled for: 6 months      Medication Assistance:  Repatha and [facility]letol [facility].  Enrollment ends when grant is completed  Compliance/Adherence/Medication fill history:  Care Gaps: Prevnar20, COVID booster Last BP - 141/80 on 05/30/2021  Star-Rating Drugs:  Losartan HCTZ 50/12.5 mg - last filled 07/23/2021 90 DS at [facility] verified with [name]   Patient's preferred pharmacy is:  [facility] ([place]) - [place], [address] [place] [address] Phone: [phone] Fax: [phone]  [place] ([place]), [place] - [address] [place] [address] Phone: [phone] Fax: [phone]  [facility] ([facility] - [place], [address] [place] [address] Phone: [phone] Fax: [phone]   Uses pill box? No - takes all medications in the morning Pt endorses 100% compliance  We discussed: Current pharmacy is preferred with insurance plan and patient is satisfied with pharmacy services Patient decided to: Continue current medication management strategy  Care Plan and Follow Up Patient Decision:  Patient agrees to Care Plan and Follow-up.  Plan: Telephone follow up appointment with care management team member scheduled for:  6 months  [name], PharmD [place] Pharmacist [place] [facility] [place] [phone]

## 2021-09-06 ENCOUNTER — Telehealth: Payer: Self-pay | Admitting: Pharmacist

## 2021-09-06 NOTE — Chronic Care Management (AMB) (Signed)
    Chronic Care Management Pharmacy Assistant  09/09/2021 APPOINTMENT REMINDER  Vanessa Price was reminded to have all medications, supplements and any blood glucose and blood pressure readings available for review with [name], Pharm. D, at her telephone visit on 09/09/2021 at 8:30.  Care Gaps: AWV - message sent to [name] Last BP - 141/80 on 05/30/2021 Pneumonia vaccine - never done Covid booster - overdue  Star Rating Drug: Losartan HCTZ 50/12.5 mg - last filled 07/23/2021 90 DS at [facility] verified with [name]  Any gaps in medications fill history? No  [name]  Catering manager [phone]

## 2021-09-09 ENCOUNTER — Ambulatory Visit (INDEPENDENT_AMBULATORY_CARE_PROVIDER_SITE_OTHER): Payer: Medicare Other | Admitting: Pharmacist

## 2021-09-09 DIAGNOSIS — E785 Hyperlipidemia, unspecified: Secondary | ICD-10-CM

## 2021-09-09 DIAGNOSIS — I10 Essential (primary) hypertension: Secondary | ICD-10-CM

## 2021-09-09 NOTE — Patient Instructions (Signed)
Hi [place],  It was great to catch up again! Don't forget to start checking your blood pressure a few days a week when you get your new cuff and bring it with you when you see Dr. Jerilee Hoh.  Please reach out to me if you have any questions or need anything before our follow up!  Best, Maddie  [name], PharmD, [place] [facility] [place]  Visit Information   Goals Addressed   None    Patient Care Plan: CCM Pharmacy Care Plan     Problem Identified: Problem: Hypertension, Hyperlipidemia and constipation, pain, pre-diabetes      Long-Range Goal: Patient-Specific Goal   Start Date: 06/14/2020  Expected End Date: 06/14/2021  Recent Progress: On track  Priority: High  Note:   Current Barriers:  Unable to independently monitor therapeutic efficacy  Pharmacist Clinical Goal(s):  Patient will achieve control of triglycerides as evidenced by next lipid panel  through collaboration with PharmD and provider.   Interventions: 1:1 collaboration with [name], [name], MD regarding development and update of comprehensive plan of care as evidenced by provider attestation and co-signature Inter-disciplinary care team collaboration (see longitudinal plan of care) Comprehensive medication review performed; medication list updated in electronic medical record  Hypertension (BP goal <140/90) -Not ideally controlled -Current treatment: Losartan-hydrochlorothiazide 50-'25mg'$ , 1 tablet once daily - Appropriate, Query effective, Safe, Accessible -Medications previously tried: olmesartan (cost)  -Current home readings: not checking regularly at home; has an arm cuff that stopped working -Current dietary habits: did not discuss -Current exercise habits: walking 2 miles a day 4 days a week -Denies hypotensive/hypertensive symptoms -Educated on Exercise goal of 150 minutes per week; Importance of home blood pressure  monitoring; -Counseled to monitor BP at home weekly, document, and provide log at future appointments -Counseled on diet and exercise extensively Recommended to continue current medication  Hyperlipidemia: (LDL goal < 100) -Controlled -Current treatment: Repatha 140 mg inject every 14 days - Appropriate, Effective, Safe, Accessible Lovaza 1 g 4 capsules once daily - Appropriate, Query effective, Safe, Accessible -Medications previously tried: atorvastatin, pravastatin, ezetimibe, Nexletol (no longer needed)  -Current dietary patterns: limiting fatty foods; does admit to overeating often -Current exercise habits: walking 4 days a week -Educated on Cholesterol goals;  Importance of limiting foods high in cholesterol; Exercise goal of 150 minutes per week; -Recommended to continue current medication  Pre-diabetes (A1c goal <6.5%) -Controlled -Current medications: No medications -Medications previously tried: none  -Current home glucose readings fasting glucose: n/a post prandial glucose: n/a -Denies hypoglycemic/hyperglycemic symptoms -Current meal patterns:  breakfast: did not discuss  lunch: did not discuss   dinner: did not discuss  snacks: did not discuss  drinks: did not discuss  -Current exercise: walking 4 days a week -Educated on A1c and blood sugar goals; Exercise goal of 150 minutes per week; Carbohydrate counting and/or plate method -Counseled to check feet daily and get yearly eye exams -Counseled on diet and exercise extensively Congratulated patient on improved A1c  Constipation (Goal: regular bowel movements) -Controlled -Current treatment  Miralax as needed - primarily using this - Appropriate, Effective, Safe, Accessible Colace as needed - Appropriate, Effective, Safe, Accessible Psyllium powder, 2 tablespoons daily as needed - Appropriate, Effective, Safe, Accessible -Medications previously tried: n/a  -Counseled on increasing water intake and fiber  intake  Pain (Goal: minimize symptoms) -Controlled -Current treatment  Naproxen '220mg'$ , 2 tablets as needed - Appropriate, Effective, Safe, Accessible -Medications previously tried: n/a  -Recommended to continue current  medication  Vitamin D deficiency (Goal: vitamin D 30-100) -Controlled -Current treatment  None  -Medications previously tried: none  -Recommended repeat vitamin D level  Health Maintenance -Vaccine gaps: Prevnar, influenza - patient declines both vaccines at this time -Current therapy:  Multivitamin 1 tablet daily Probiotics daily -Educated on Cost vs benefit of each product must be carefully weighed by individual consumer -Patient is satisfied with current therapy and denies issues -Recommended to continue current medication  Patient Goals/Self-Care Activities Patient will:  - check blood pressure weekly, document, and provide at future appointments target a minimum of 150 minutes of moderate intensity exercise weekly engage in dietary modifications by increasing fiber intake and avoid overeating  Follow Up Plan: Telephone follow up appointment with care management team member scheduled for: 6 months       Patient verbalizes understanding of instructions and care plan provided today and agrees to view in [place]. Active MyChart status and patient understanding of how to access instructions and care plan via MyChart confirmed with patient.    Telephone follow up appointment with pharmacy team member scheduled for: 6 months  Viona Gilmore, [facility]

## 2021-09-20 DIAGNOSIS — E785 Hyperlipidemia, unspecified: Secondary | ICD-10-CM | POA: Diagnosis not present

## 2021-09-20 DIAGNOSIS — I10 Essential (primary) hypertension: Secondary | ICD-10-CM

## 2021-09-30 DIAGNOSIS — G4733 Obstructive sleep apnea (adult) (pediatric): Secondary | ICD-10-CM | POA: Diagnosis not present

## 2021-10-09 DIAGNOSIS — G4733 Obstructive sleep apnea (adult) (pediatric): Secondary | ICD-10-CM | POA: Diagnosis not present

## 2021-10-18 DIAGNOSIS — I10 Essential (primary) hypertension: Secondary | ICD-10-CM | POA: Diagnosis not present

## 2021-10-18 DIAGNOSIS — M1990 Unspecified osteoarthritis, unspecified site: Secondary | ICD-10-CM | POA: Diagnosis not present

## 2021-10-30 IMAGING — CT CT ABD-PELV W/ CM
2 of 5 series · 16 of 46 positions shown, 18 images · IV contrast (APPLIED)
Comparison: None.

CLINICAL DATA: Diverticulitis suspected. Left lower quadrant pain
for 4 days. Excessive burping and passing gas.

EXAM:
CT ABDOMEN AND PELVIS WITH CONTRAST
TECHNIQUE: Multidetector CT imaging of the abdomen and pelvis was performed
using the standard protocol following bolus administration of
intravenous contrast.
CONTRAST:  75mL OMNIPAQUE IOHEXOL 350 MG/ML SOLN

[Series 3: abd/ pelvis 5.0 i30f 2 · axial · 0.79mm/px · z∈[+660,+1095]mm · 13 of 97 slices shown, 15 images]
[im 5/97  soft-tissue]
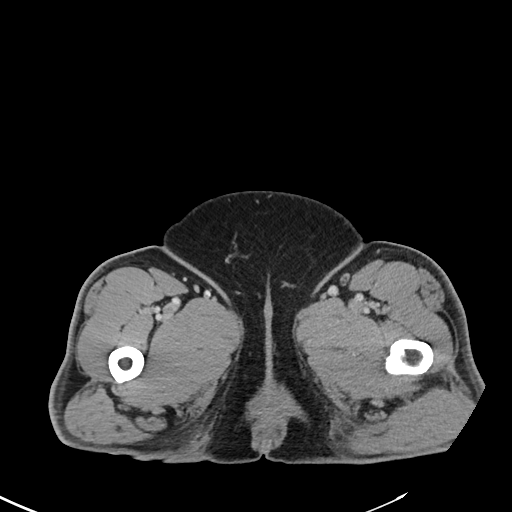
[im 5/97  bone]
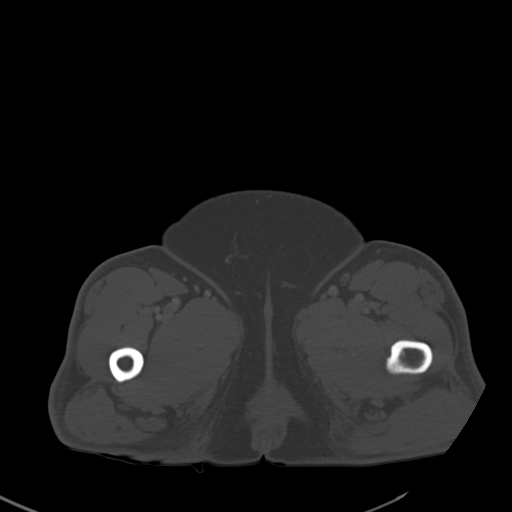
[im 14/97  soft-tissue]
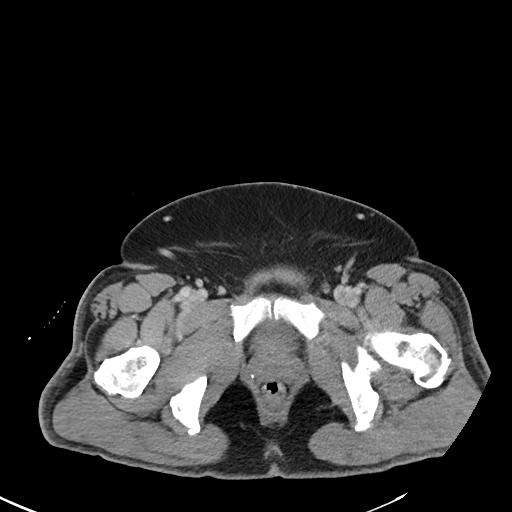
[im 19/97  soft-tissue]
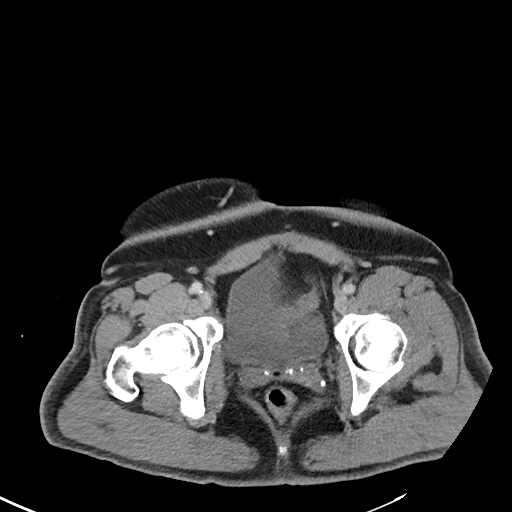
[im 28/97  soft-tissue]
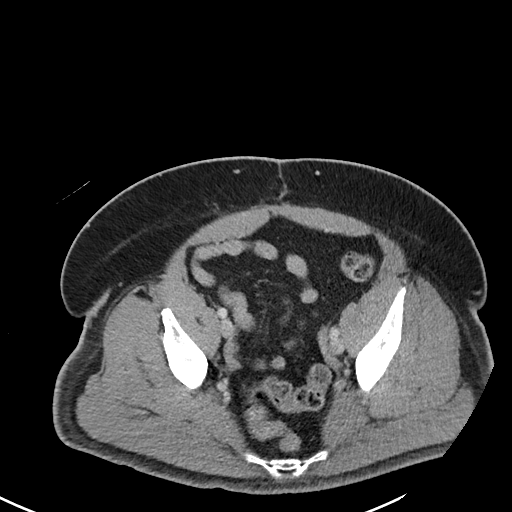
[im 33/97  soft-tissue]
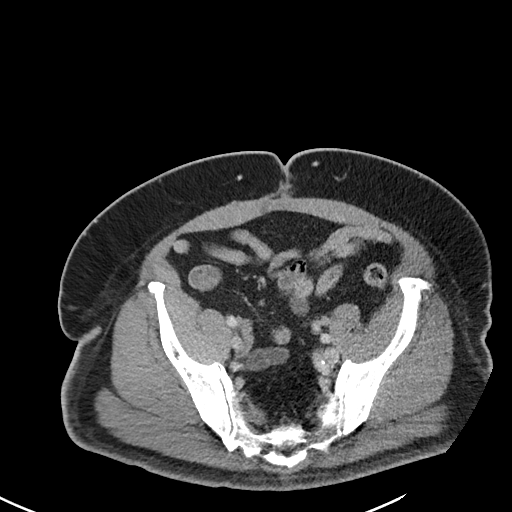
[im 42/97  soft-tissue]
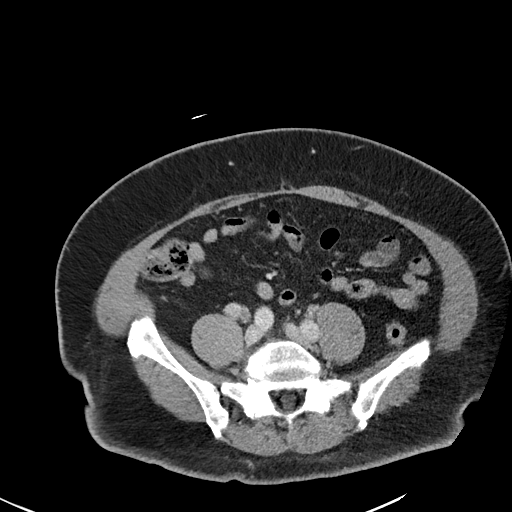
[im 51/97  soft-tissue]
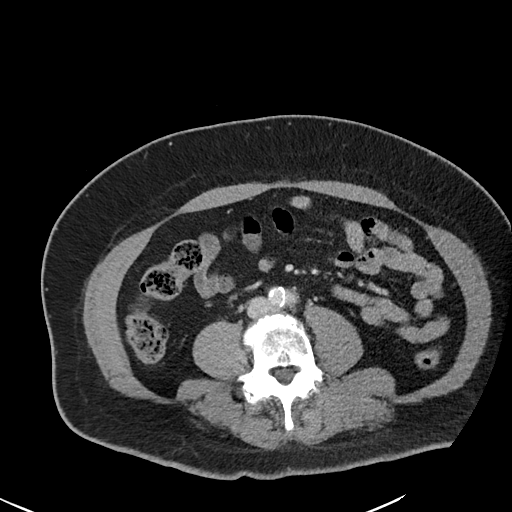
[im 55/97  soft-tissue]
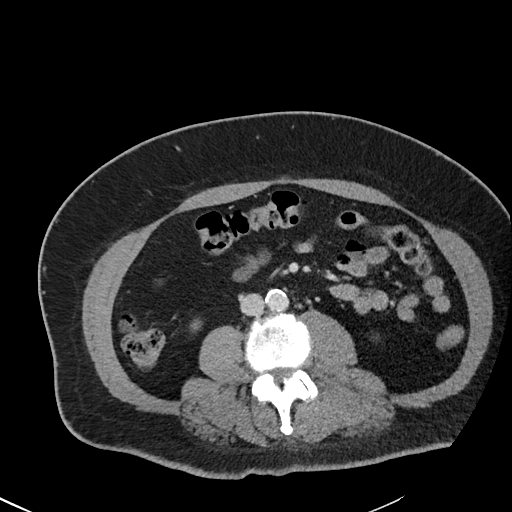
[im 65/97  soft-tissue]
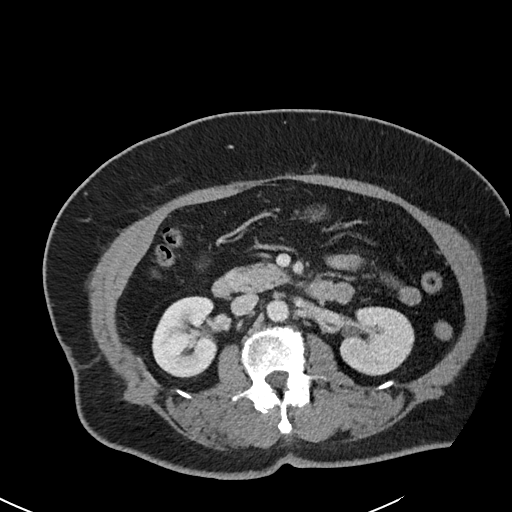
[im 65/97  bone]
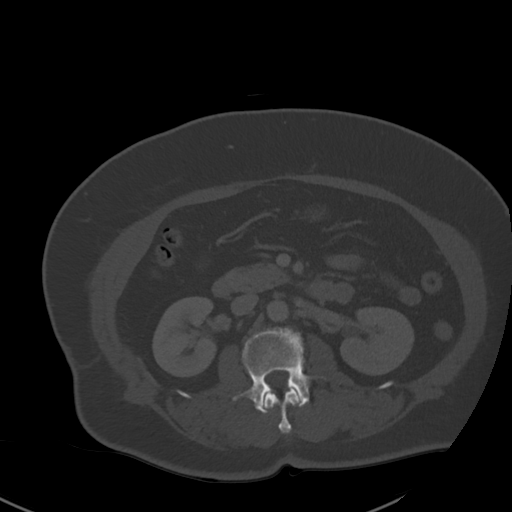
[im 69/97  soft-tissue]
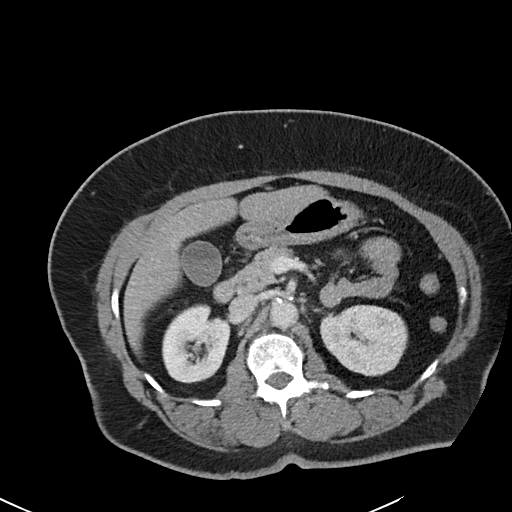
[im 78/97  soft-tissue]
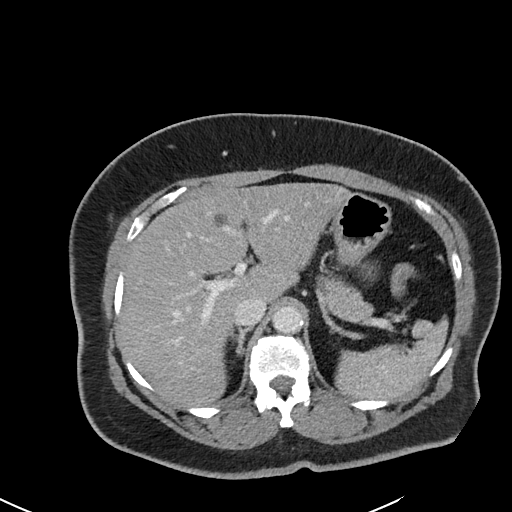
[im 83/97  soft-tissue]
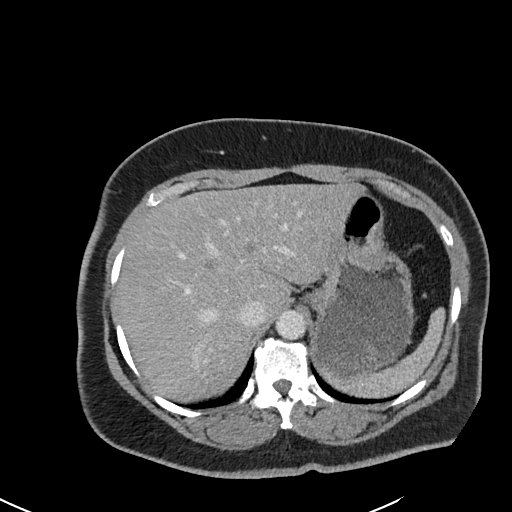
[im 92/97  soft-tissue]
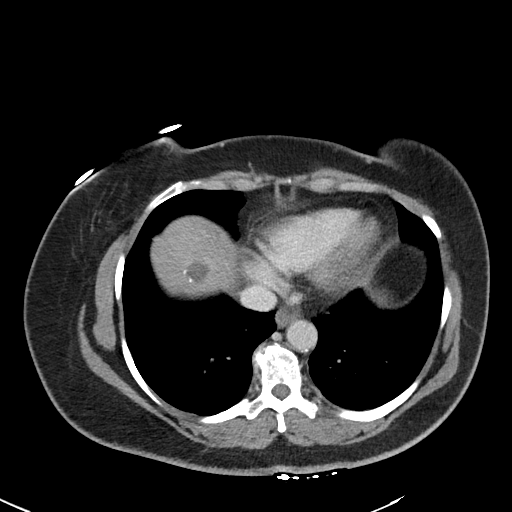

[Series 6: coronal soft tissue · coronal · 0.80mm/px · 3 of 106 slices shown]
[im 36/106  soft-tissue]
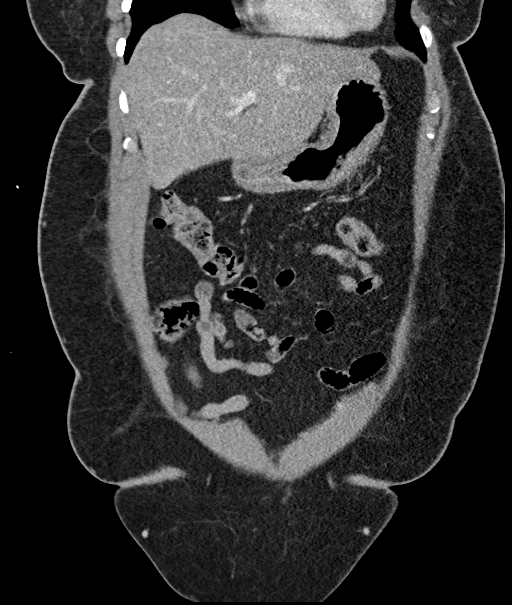
[im 47/106  soft-tissue]
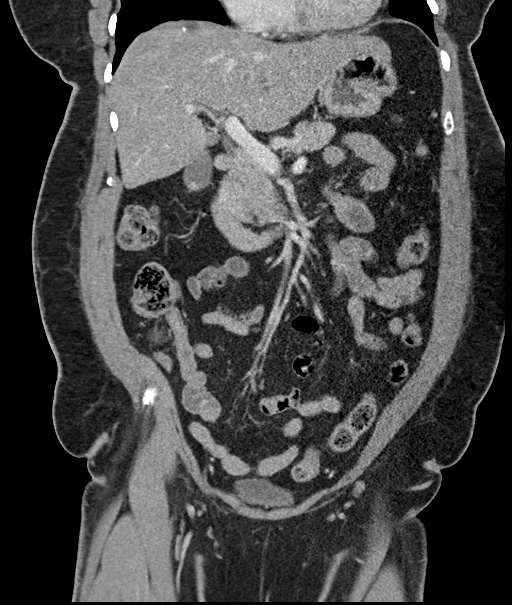
[im 59/106  soft-tissue]
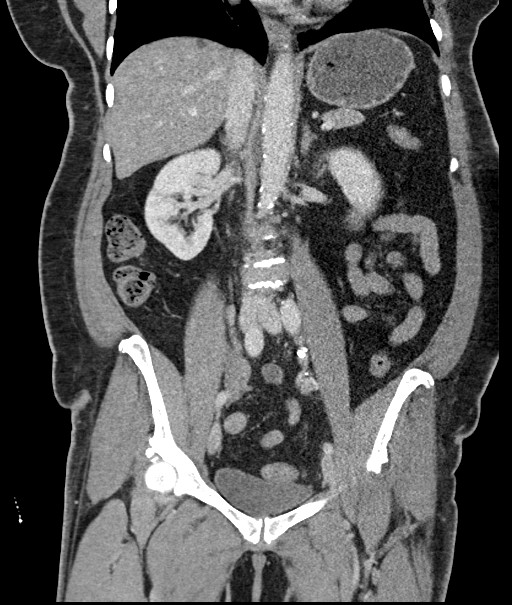

[16 of 46 positions shown; findings below may reference images not displayed]

FINDINGS: Lower chest: No acute abnormality.

Hepatobiliary: Several scattered hypodense hepatic lesions with 2
largest measuring 1.3 cm ([DATE]) and 1.7 cm ([DATE]). Punctate
gallstones noted within the gallbladder lumen. No gallbladder wall
thickening or pericholecystic fluid. No biliary dilatation.

Pancreas: No focal lesion. Normal pancreatic contour. No surrounding
inflammatory changes. No main pancreatic ductal dilatation.

Spleen: Normal in size without focal abnormality. Several splenules
are noted ([DATE], 23).

Adrenals/Urinary Tract:

No adrenal nodule bilaterally.

Bilateral kidneys enhance symmetrically. Subcentimeter hypodensities
are too small to characterize.

No hydronephrosis. No hydroureter.  No nephroureterolithiasis.

The urinary bladder is unremarkable.

Stomach/Bowel: Stomach is within normal limits. No evidence of bowel
wall thickening or dilatation. Appendix appears normal.

Vascular/Lymphatic: Phleboliths are noted within the pelvis along
the ovarian veins. No abdominal aorta or iliac aneurysm. Severe
atherosclerotic plaque of the aorta and its branches. No abdominal,
pelvic, or inguinal lymphadenopathy.

Reproductive: Status post hysterectomy. No adnexal masses.

Other: No intraperitoneal free fluid. No intraperitoneal free gas.
No organized fluid collection.

Musculoskeletal:

No abdominal wall hernia or abnormality.

No suspicious lytic or blastic osseous lesions. No acute displaced
fracture. Multilevel degenerative changes of the spine.
IMPRESSION: 1. No acute intra-abdominal or intrapelvic abnormality.
2. Cholelithiasis with no CT findings of acute cholecystitis or
choledocholithiasis.
3. Several indeterminate scattered hypodense hepatic lesions with 2
largest measuring 1.3 cm and 1.7 cm. Comparison with prior
cross-sectional imaging would be of value to evaluate stability.
4.  Aortic Atherosclerosis ([id number].J).

## 2021-10-31 DIAGNOSIS — G4733 Obstructive sleep apnea (adult) (pediatric): Secondary | ICD-10-CM | POA: Diagnosis not present

## 2021-11-04 DIAGNOSIS — I10 Essential (primary) hypertension: Secondary | ICD-10-CM | POA: Diagnosis not present

## 2021-11-04 DIAGNOSIS — M1990 Unspecified osteoarthritis, unspecified site: Secondary | ICD-10-CM | POA: Diagnosis not present

## 2021-11-12 DIAGNOSIS — N644 Mastodynia: Secondary | ICD-10-CM | POA: Diagnosis not present

## 2021-11-12 LAB — HM MAMMOGRAPHY

## 2021-11-13 ENCOUNTER — Ambulatory Visit (INDEPENDENT_AMBULATORY_CARE_PROVIDER_SITE_OTHER): Payer: Medicare Other | Admitting: Internal Medicine

## 2021-11-13 ENCOUNTER — Encounter: Payer: Self-pay | Admitting: Internal Medicine

## 2021-11-13 ENCOUNTER — Other Ambulatory Visit: Payer: Self-pay | Admitting: Internal Medicine

## 2021-11-13 VITALS — BP 140/85 | HR 75 | Temp 97.9°F | Ht 64.0 in | Wt 196.2 lb

## 2021-11-13 DIAGNOSIS — G4733 Obstructive sleep apnea (adult) (pediatric): Secondary | ICD-10-CM | POA: Diagnosis not present

## 2021-11-13 DIAGNOSIS — E669 Obesity, unspecified: Secondary | ICD-10-CM | POA: Diagnosis not present

## 2021-11-13 DIAGNOSIS — E785 Hyperlipidemia, unspecified: Secondary | ICD-10-CM | POA: Diagnosis not present

## 2021-11-13 DIAGNOSIS — E559 Vitamin D deficiency, unspecified: Secondary | ICD-10-CM

## 2021-11-13 DIAGNOSIS — Z0000 Encounter for general adult medical examination without abnormal findings: Secondary | ICD-10-CM | POA: Diagnosis not present

## 2021-11-13 DIAGNOSIS — R7302 Impaired glucose tolerance (oral): Secondary | ICD-10-CM

## 2021-11-13 DIAGNOSIS — I10 Essential (primary) hypertension: Secondary | ICD-10-CM | POA: Diagnosis not present

## 2021-11-13 DIAGNOSIS — K219 Gastro-esophageal reflux disease without esophagitis: Secondary | ICD-10-CM | POA: Diagnosis not present

## 2021-11-13 DIAGNOSIS — Z9989 Dependence on other enabling machines and devices: Secondary | ICD-10-CM | POA: Diagnosis not present

## 2021-11-13 LAB — COMPREHENSIVE METABOLIC PANEL
ALT: 18 U/L (ref 0–35)
AST: 17 U/L (ref 0–37)
Albumin: 4.5 g/dL (ref 3.5–5.2)
Alkaline Phosphatase: 66 U/L (ref 39–117)
BUN: 14 mg/dL (ref 6–23)
CO2: 28 mEq/L (ref 19–32)
Calcium: 9.6 mg/dL (ref 8.4–10.5)
Chloride: 104 mEq/L (ref 96–112)
Creatinine, Ser: 0.7 mg/dL (ref 0.40–1.20)
GFR: 89.11 mL/min (ref >60.00)
Glucose, Bld: 82 mg/dL (ref 70–99)
Potassium: 3.5 mEq/L (ref 3.5–5.1)
Sodium: 141 mEq/L (ref 135–145)
Total Bilirubin: 0.3 mg/dL (ref 0.2–1.2)
Total Protein: 7.3 g/dL (ref 6.0–8.3)

## 2021-11-13 LAB — CBC WITH DIFFERENTIAL/PLATELET
Basophils Absolute: 0 10*3/uL (ref 0.0–0.1)
Basophils Relative: 0.3 % (ref 0.0–3.0)
Eosinophils Absolute: 0.2 10*3/uL (ref 0.0–0.7)
Eosinophils Relative: 5.7 % — ABNORMAL HIGH (ref 0.0–5.0)
HCT: 40.8 % (ref 36.0–46.0)
Hemoglobin: 13.4 g/dL (ref 12.0–15.0)
Lymphocytes Relative: 46.8 % — ABNORMAL HIGH (ref 12.0–46.0)
Lymphs Abs: 1.8 10*3/uL (ref 0.7–4.0)
MCHC: 33 g/dL (ref 30.0–36.0)
MCV: 84.1 fl (ref 78.0–100.0)
Monocytes Absolute: 0.2 10*3/uL (ref 0.1–1.0)
Monocytes Relative: 4 % (ref 3.0–12.0)
Neutro Abs: 1.6 10*3/uL (ref 1.4–7.7)
Neutrophils Relative %: 43.2 % (ref 43.0–77.0)
Platelets: 189 10*3/uL (ref 150.0–400.0)
RBC: 4.85 Mil/uL (ref 3.87–5.11)
RDW: 14.8 % (ref 11.5–15.5)
WBC: 3.8 10*3/uL — ABNORMAL LOW (ref 4.0–10.5)

## 2021-11-13 LAB — LIPID PANEL
Cholesterol: 152 mg/dL (ref 0–200)
HDL: 40.8 mg/dL (ref >39.00)
LDL Cholesterol: 75 mg/dL (ref 0–99)
NonHDL: 111.43
Total CHOL/HDL Ratio: 4
Triglycerides: 181 mg/dL — ABNORMAL HIGH (ref 0.0–149.0)
VLDL: 36.2 mg/dL (ref 0.0–40.0)

## 2021-11-13 LAB — VITAMIN B12: Vitamin B-12: 580 pg/mL (ref 211–911)

## 2021-11-13 LAB — TSH: TSH: 1.51 u[IU]/mL (ref 0.35–5.50)

## 2021-11-13 LAB — HEMOGLOBIN A1C: Hgb A1c MFr Bld: 6.3 % (ref 4.6–6.5)

## 2021-11-13 LAB — VITAMIN D 25 HYDROXY (VIT D DEFICIENCY, FRACTURES): VITD: 26.45 ng/mL — ABNORMAL LOW (ref 30.00–100.00)

## 2021-11-13 MED ORDER — LOSARTAN POTASSIUM-HCTZ 100-25 MG PO TABS: 1.0000 | ORAL_TABLET | Freq: Every day | ORAL | 1 refills | Status: DC

## 2021-11-13 MED ORDER — VITAMIN D (ERGOCALCIFEROL) 1.25 MG (50000 UNIT) PO CAPS
50000.0000 [IU] | ORAL_CAPSULE | ORAL | 0 refills | Status: AC
Start: 2021-11-13 — End: 2022-01-30

## 2021-11-13 NOTE — Progress Notes (Signed)
Established Patient Office Visit     CC/Reason for Visit: Annual preventive exam and subsequent Medicare wellness visit  HPI: Vanessa Price is a 66 y.o. female who is coming in today for the above mentioned reasons. Past Medical History is significant for: Hyperlipidemia, impaired glucose tolerance, hypertension, obesity.  She was recently diagnosed with obstructive sleep apnea and is using nightly CPAP.  Blood pressure has not been well controlled, I will insert ambulatory measurements below:      Past Medical/Surgical History: Past Medical History:  Diagnosis Date   ANXIETY 06/04/2009   GERD 08/14/2008   occasional   Hx of adenomatous polyp of colon 03/11/2018   HYPERLIPIDEMIA 09/14/2006   HYPERTENSION 09/14/2006   Impaired glucose tolerance    MYOCARDIAL PERFUSION SCAN, WITH STRESS TEST, ABNORMAL 10/19/2008   PREMATURE VENTRICULAR CONTRACTIONS 08/14/2008    Past Surgical History:  Procedure Laterality Date   ABDOMINAL HYSTERECTOMY  1999   menorrhagia/partial   CESAREAN SECTION     2 times    Social History:  reports that she quit smoking about 28 years ago. Her smoking use included cigarettes. She has never used smokeless tobacco. She reports that she does not currently use alcohol. She reports that she does not use drugs.  Allergies: Allergies  Allergen Reactions   Lisinopril     REACTION: cough   Risperidone     REACTION: unsure of dose; pt describes reaction of bad cough    Family History:  Family History  Problem Relation Age of Onset   Arthritis Mother    Diabetes Mother    Heart disease Mother    Mental illness Father    Cancer Brother        Lung     Current Outpatient Medications:    Evolocumab (REPATHA SURECLICK) 573 MG/ML SOAJ, INJECT 1 DOSE INTO THE SKIN EVERY 14 DAYS, Disp: 2 mL, Rfl: 11   losartan-hydrochlorothiazide (HYZAAR) 100-25 MG tablet, Take 1 tablet by mouth daily., Disp: 90 tablet, Rfl: 1   Multiple  Vitamins-Minerals (ONE DAILY WOMENS 50 PLUS) TABS, Take by mouth. Vitacost Women's 50+ multivitamin, Disp: , Rfl:    naproxen sodium (ALEVE) 220 MG tablet, Take 220 mg by mouth as needed. , Disp: , Rfl:    omega-3 acid ethyl esters (LOVAZA) 1 g capsule, TAKE 2 CAPSULES BY MOUTH  TWICE DAILY (Patient not taking: Reported on 11/13/2021), Disp: 360 capsule, Rfl: 2  Review of Systems:  Constitutional: Denies fever, chills, diaphoresis, appetite change and fatigue.  HEENT: Denies photophobia, eye pain, redness, hearing loss, ear pain, congestion, sore throat, rhinorrhea, sneezing, mouth sores, trouble swallowing, neck pain, neck stiffness and tinnitus.   Respiratory: Denies SOB, DOE, cough, chest tightness,  and wheezing.   Cardiovascular: Denies chest pain, palpitations and leg swelling.  Gastrointestinal: Denies nausea, vomiting, abdominal pain, diarrhea, constipation, blood in stool and abdominal distention.  Genitourinary: Denies dysuria, urgency, frequency, hematuria, flank pain and difficulty urinating.  Endocrine: Denies: hot or cold intolerance, sweats, changes in hair or nails, polyuria, polydipsia. Musculoskeletal: Denies myalgias, back pain, joint swelling, arthralgias and gait problem.  Skin: Denies pallor, rash and wound.  Neurological: Denies dizziness, seizures, syncope, weakness, light-headedness, numbness and headaches.  Hematological: Denies adenopathy. Easy bruising, personal or family bleeding history  Psychiatric/Behavioral: Denies suicidal ideation, mood changes, confusion, nervousness, sleep disturbance and agitation    Physical Exam: Vitals:   11/13/21 1054 11/13/21 1059  BP: (!) 140/80 (!) 140/85  Pulse: 75   Temp: 97.9 F (36.6  C)   TempSrc: Oral   SpO2: 99%   Weight: 196 lb 3.2 oz (89 kg)   Height: '5\' 4"'$  (1.626 m)     Body mass index is 33.68 kg/m.   Constitutional: NAD, calm, comfortable Eyes: PERRL, lids and conjunctivae normal ENMT: Mucous membranes are  moist. Posterior pharynx clear of any exudate or lesions. Normal dentition. Tympanic membrane is pearly white, no erythema or bulging. Neck: normal, supple, no masses, no thyromegaly Respiratory: clear to auscultation bilaterally, no wheezing, no crackles. Normal respiratory effort. No accessory muscle use.  Cardiovascular: Regular rate and rhythm, no murmurs / rubs / gallops. No extremity edema. 2+ pedal pulses. No carotid bruits.  Abdomen: no tenderness, no masses palpated. No hepatosplenomegaly. Bowel sounds positive.  Musculoskeletal: no clubbing / cyanosis. No joint deformity upper and lower extremities. Good ROM, no contractures. Normal muscle tone.  Skin: no rashes, lesions, ulcers. No induration Neurologic: CN 2-12 grossly intact. Sensation intact, DTR normal. Strength 5/5 in all 4.  Psychiatric: Normal judgment and insight. Alert and oriented x 3. Normal mood.    Subsequent Medicare wellness visit   1. Risk factors, based on past  M,S,F -cardiovascular disease risk factors include age, history of hyperlipidemia and hypertension   2.  Physical activities: Very sedentary   3.  Depression/mood: Stable, not depressed   4.  Hearing: No perceived issues   5.  ADL's: Independent in all ADLs   6.  Fall risk: Low fall risk   7.  Home safety: No problems identified   8.  Height weight, and visual acuity: height and weight as above, vision:  Vision Screening   Right eye Left eye Both eyes  Without correction 20/25 20/160 20/25  With correction        9.  Counseling: Advised to increase physical activity   10. Lab orders based on risk factors: Laboratory update will be reviewed   11. Referral : DEXA   12. Care plan: Follow-up with me in 8 weeks   13. Cognitive assessment: No cognitive impairment   14. Screening: Patient provided with a written and personalized 5-10 year screening schedule in the AVS. yes   15. Provider List Update: PCP, cardiology  16. Advance  Directives: Full code   17. Opioids: Patient is not on any opioid prescriptions and has no risk factors for a substance use disorder.   [place] Office Visit from 11/13/2021 in [place] [facility] [place]  PHQ-9 Total Score 2          10/26/2018    7:05 AM 08/31/2020    7:24 AM 12/17/2020   11:09 AM 04/15/2021    8:25 AM 11/13/2021   11:06 AM  Fall Risk  Falls in the past year? 0 0  0 1  Was there an injury with Fall? 0 0  0 0  Fall Risk Category Calculator 0 0  0 1  Fall Risk Category Low Low  Low Low  Patient Fall Risk Level   Low fall risk Low fall risk Low fall risk  Patient at Risk for Falls Due to    No Fall Risks Impaired balance/gait  Fall risk Follow up    Falls evaluation completed Falls evaluation completed     Impression and Plan:  Encounter for preventive health examination -Recommend routine eye and dental care. -Immunizations: Due for bivalent COVID and pneumonia, declines all today despite counseling -Healthy lifestyle discussed in detail. -Labs to be updated today. -Colon cancer screening: 02/2018 -Breast cancer screening:  12/2020 -Cervical cancer screening: 12/2020 -Lung cancer screening: Not applicable, quit smoking greater than 25 years ago -Prostate cancer screening: Not applicable -DEXA: Requested today  Essential hypertension  - Plan: CBC with Differential/Platelet, Comprehensive metabolic panel, losartan-hydrochlorothiazide (HYZAAR) 100-25 MG tablet -Blood pressure not well controlled, increase Hyzaar to 100/25 mg and return in 8 weeks for follow-up  OSA on CPAP -Significant improvement on CPAP therapy  Impaired glucose tolerance  - Plan: Hemoglobin A1c  Dyslipidemia  - Plan: Lipid panel  Vitamin D deficiency  - Plan: VITAMIN D 25 Hydroxy (Vit-D Deficiency, Fractures)  Obesity (BMI 30.0-34.9)  - Plan: TSH, Vitamin B12 -Discussed healthy lifestyle, including increased physical activity and better food choices to promote weight  loss.  Gastroesophageal reflux disease without esophagitis -Well-controlled not on daily PPI therapy     Patient Instructions  -Nice seeing you today!!  -Lab work today; will notify you once results are available.  -Remember your pneumonia vaccine.  -Increase dose of hyzaar to 100/25 mg daily.  -Schedule follow up in 8 weeks.      [name], MD [facility]

## 2021-11-13 NOTE — Patient Instructions (Signed)
-  Nice seeing you today!!  -Lab work today; will notify you once results are available.  -Remember your pneumonia vaccine.  -Increase dose of hyzaar to 100/25 mg daily.  -Schedule follow up in 8 weeks.

## 2021-11-14 ENCOUNTER — Other Ambulatory Visit: Payer: Self-pay | Admitting: *Deleted

## 2021-11-14 ENCOUNTER — Encounter: Payer: Self-pay | Admitting: Internal Medicine

## 2021-11-14 DIAGNOSIS — E559 Vitamin D deficiency, unspecified: Secondary | ICD-10-CM

## 2021-11-14 DIAGNOSIS — R7302 Impaired glucose tolerance (oral): Secondary | ICD-10-CM

## 2021-11-21 DIAGNOSIS — M1990 Unspecified osteoarthritis, unspecified site: Secondary | ICD-10-CM | POA: Diagnosis not present

## 2021-11-21 DIAGNOSIS — I10 Essential (primary) hypertension: Secondary | ICD-10-CM | POA: Diagnosis not present

## 2021-11-23 DIAGNOSIS — M1990 Unspecified osteoarthritis, unspecified site: Secondary | ICD-10-CM | POA: Diagnosis not present

## 2021-11-23 DIAGNOSIS — I10 Essential (primary) hypertension: Secondary | ICD-10-CM | POA: Diagnosis not present

## 2021-12-01 DIAGNOSIS — G4733 Obstructive sleep apnea (adult) (pediatric): Secondary | ICD-10-CM | POA: Diagnosis not present

## 2021-12-11 ENCOUNTER — Telehealth: Payer: Self-pay | Admitting: Pharmacist

## 2021-12-11 DIAGNOSIS — H2512 Age-related nuclear cataract, left eye: Secondary | ICD-10-CM | POA: Diagnosis not present

## 2021-12-11 DIAGNOSIS — H25012 Cortical age-related cataract, left eye: Secondary | ICD-10-CM | POA: Diagnosis not present

## 2021-12-11 NOTE — Chronic Care Management (AMB) (Signed)
Chronic Care Management Pharmacy Assistant   Name: Vanessa Price  MRN: [id number] DOB: [birth year]  Reason for Encounter: Disease State / Hypertension Assessment Call   Conditions to be addressed/monitored: HTN  Recent office visits:  11/13/2021 [name] MD - Patient was seen for Encounter for preventive health examination and additional concerns. Increased Hyzaar to 100/25 mg daily. Follow up in 8 weeks.  Recent consult visits:  None  Hospital visits:  None  Medications: Outpatient Encounter Medications as of 12/11/2021  Medication Sig   Evolocumab (REPATHA SURECLICK) 631 MG/ML SOAJ INJECT 1 DOSE INTO THE SKIN EVERY 14 DAYS   losartan-hydrochlorothiazide (HYZAAR) 100-25 MG tablet Take 1 tablet by mouth daily.   Multiple Vitamins-Minerals (ONE DAILY WOMENS 50 PLUS) TABS Take by mouth. Vitacost Women's 50+ multivitamin   naproxen sodium (ALEVE) 220 MG tablet Take 220 mg by mouth as needed.    omega-3 acid ethyl esters (LOVAZA) 1 g capsule TAKE 2 CAPSULES BY MOUTH  TWICE DAILY (Patient not taking: Reported on 11/13/2021)   Vitamin D, Ergocalciferol, (DRISDOL) 1.25 MG (50000 UNIT) CAPS capsule Take 1 capsule (50,000 Units total) by mouth every 7 (seven) days for 12 doses.   No facility-administered encounter medications on file as of 12/11/2021.  Fill History:  OMEGA-3-ACID ETHYL ESTERS  1 GM CAPS 08/13/2021 90   REPATHA SURE '140MG'$ /ML PEN INJ 11/07/2021 28   Reviewed chart prior to disease state call. Spoke with patient regarding BP  Recent Office Vitals: BP Readings from Last 3 Encounters:  11/13/21 (!) 140/85  05/30/21 (!) 141/80  04/15/21 (!) 168/100   Pulse Readings from Last 3 Encounters:  11/13/21 75  05/30/21 80  04/15/21 86    Wt Readings from Last 3 Encounters:  11/13/21 196 lb 3.2 oz (89 kg)  05/30/21 198 lb (89.8 kg)  04/15/21 200 lb (90.7 kg)     Kidney Function Lab Results  Component Value Date/Time   CREATININE 0.70  11/13/2021 11:34 AM   CREATININE 0.81 12/17/2020 11:36 AM   GFR 89.11 11/13/2021 11:34 AM   GFRNONAA >60 12/17/2020 11:36 AM   GFRAA 96 01/04/2008 12:00 AM       Latest Ref Rng & Units 11/13/2021   11:34 AM 12/17/2020   11:36 AM 08/31/2020    8:05 AM  BMP  Glucose 70 - 99 mg/dL 82  102  100   BUN 6 - 23 mg/dL '14  13  20   '$ Creatinine 0.40 - 1.20 mg/dL 0.70  0.81  0.94   Sodium 135 - 145 mEq/L 141  137  144   Potassium 3.5 - 5.1 mEq/L 3.5  4.0  3.9   Chloride 96 - 112 mEq/L 104  106  108   CO2 19 - 32 mEq/L '28  23  27   '$ Calcium 8.4 - 10.5 mg/dL 9.6  9.4  9.8     Current antihypertensive regimen:  Losartan HCTZ 100/25 mg daily  How often are you checking your Blood Pressure? Patient states she has not been checking her blood pressures regular for a while.  Current home BP readings: Patient is unsure of her recent blood pressure readings or the range.  She states she will check her blood pressure daily for the next week and fax them to the office.   What recent interventions/DTPs have been made by any provider to improve Blood Pressure control since last CPP Visit: Recently increased Losartan HCTZ to 100/25 mg daily.  Any recent hospitalizations or ED visits since  last visit with CPP? No recent hospital visits.   What diet changes have been made to improve Blood Pressure Control?  Patient has been following a low sodium diet and cutting back on carbs. Breakfast - patient will have fruit Lunch/Dinner - patient will have fish or a meat with vegetables.   What exercise is being done to improve your Blood Pressure Control?  Patient states she is walking daily.   Adherence Review: Is the patient currently on ACE/ARB medication? Yes Does the patient have >5 day gap between last estimated fill dates? No  Care Gaps: AWV - 09/06/21 message sent to [name] Last BP - 140/85 on 11/13/2021 Covid booster - overdue Flu - postponed Pneumonia vaccine - postponed  Star Rating  Drugs: Losartan HCTZ 100/25 mg - last filled 11/13/2021 100 DS with [facility], verified with [place] Pharmacist Assistant [phone]

## 2021-12-21 DIAGNOSIS — M1990 Unspecified osteoarthritis, unspecified site: Secondary | ICD-10-CM | POA: Diagnosis not present

## 2021-12-21 DIAGNOSIS — I10 Essential (primary) hypertension: Secondary | ICD-10-CM | POA: Diagnosis not present

## 2021-12-31 DIAGNOSIS — G4733 Obstructive sleep apnea (adult) (pediatric): Secondary | ICD-10-CM | POA: Diagnosis not present

## 2022-01-08 ENCOUNTER — Encounter: Payer: Self-pay | Admitting: Internal Medicine

## 2022-01-08 ENCOUNTER — Ambulatory Visit (INDEPENDENT_AMBULATORY_CARE_PROVIDER_SITE_OTHER): Payer: Medicare Other | Admitting: Internal Medicine

## 2022-01-08 VITALS — BP 145/90 | HR 90 | Temp 98.5°F | Wt 196.6 lb

## 2022-01-08 DIAGNOSIS — I10 Essential (primary) hypertension: Secondary | ICD-10-CM

## 2022-01-08 MED ORDER — AMLODIPINE BESYLATE 5 MG PO TABS: 5.0000 mg | ORAL_TABLET | Freq: Every day | ORAL | 1 refills | Status: DC

## 2022-01-08 NOTE — Progress Notes (Signed)
Established Patient Office Visit     CC/Reason for Visit: Blood pressure follow-up  HPI: Brianna Bennett is a 66 y.o. female who is coming in today for the above mentioned reasons. Past Medical History is significant for: Hypertension, hyperlipidemia, impaired glucose tolerance, obstructive sleep apnea on CPAP and obesity.  At last visit we increased her Hyzaar dose in response to continuously elevated blood pressures.  She is here today for follow-up.  No acute concerns or complaints.   Past Medical/Surgical History: Past Medical History:  Diagnosis Date   ANXIETY 06/04/2009   GERD 08/14/2008   occasional   Hx of adenomatous polyp of colon 03/11/2018   HYPERLIPIDEMIA 09/14/2006   HYPERTENSION 09/14/2006   Impaired glucose tolerance    MYOCARDIAL PERFUSION SCAN, WITH STRESS TEST, ABNORMAL 10/19/2008   PREMATURE VENTRICULAR CONTRACTIONS 08/14/2008    Past Surgical History:  Procedure Laterality Date   ABDOMINAL HYSTERECTOMY  1999   menorrhagia/partial   CESAREAN SECTION     2 times    Social History:  reports that she quit smoking about 28 years ago. Her smoking use included cigarettes. She has never used smokeless tobacco. She reports that she does not currently use alcohol. She reports that she does not use drugs.  Allergies: Allergies  Allergen Reactions   Lisinopril     REACTION: cough   Risperidone     REACTION: unsure of dose; pt describes reaction of bad cough    Family History:  Family History  Problem Relation Age of Onset   Arthritis Mother    Diabetes Mother    Heart disease Mother    Mental illness Father    Cancer Brother        Lung     Current Outpatient Medications:    amLODipine (NORVASC) 5 MG tablet, Take 1 tablet (5 mg total) by mouth daily., Disp: 90 tablet, Rfl: 1   Evolocumab (REPATHA SURECLICK) 119 MG/ML SOAJ, INJECT 1 DOSE INTO THE SKIN EVERY 14 DAYS, Disp: 2 mL, Rfl: 11   losartan-hydrochlorothiazide (HYZAAR) 100-25 MG  tablet, Take 1 tablet by mouth daily., Disp: 90 tablet, Rfl: 1   Multiple Vitamins-Minerals (ONE DAILY WOMENS 50 PLUS) TABS, Take by mouth. Vitacost Women's 50+ multivitamin, Disp: , Rfl:    naproxen sodium (ALEVE) 220 MG tablet, Take 220 mg by mouth as needed. , Disp: , Rfl:    Vitamin D, Ergocalciferol, (DRISDOL) 1.25 MG (50000 UNIT) CAPS capsule, Take 1 capsule (50,000 Units total) by mouth every 7 (seven) days for 12 doses., Disp: 12 capsule, Rfl: 0  Review of Systems:  Constitutional: Denies fever, chills, diaphoresis, appetite change and fatigue.  HEENT: Denies photophobia, eye pain, redness, hearing loss, ear pain, congestion, sore throat, rhinorrhea, sneezing, mouth sores, trouble swallowing, neck pain, neck stiffness and tinnitus.   Respiratory: Denies SOB, DOE, cough, chest tightness,  and wheezing.   Cardiovascular: Denies chest pain, palpitations and leg swelling.  Gastrointestinal: Denies nausea, vomiting, abdominal pain, diarrhea, constipation, blood in stool and abdominal distention.  Genitourinary: Denies dysuria, urgency, frequency, hematuria, flank pain and difficulty urinating.  Endocrine: Denies: hot or cold intolerance, sweats, changes in hair or nails, polyuria, polydipsia. Musculoskeletal: Denies myalgias, back pain, joint swelling, arthralgias and gait problem.  Skin: Denies pallor, rash and wound.  Neurological: Denies dizziness, seizures, syncope, weakness, light-headedness, numbness and headaches.  Hematological: Denies adenopathy. Easy bruising, personal or family bleeding history  Psychiatric/Behavioral: Denies suicidal ideation, mood changes, confusion, nervousness, sleep disturbance and agitation    Physical  Exam: Vitals:   01/08/22 1058 01/08/22 1126  BP: (!) 162/98 (!) 145/90  Pulse: 90   Temp: 98.5 F (36.9 C)   TempSrc: Oral   SpO2: 100%   Weight: 196 lb 9.6 oz (89.2 kg)     Body mass index is 33.75 kg/m.   Constitutional: NAD, calm,  comfortable Eyes: PERRL, lids and conjunctivae normal ENMT: Mucous membranes are moist.  Respiratory: clear to auscultation bilaterally, no wheezing, no crackles. Normal respiratory effort. No accessory muscle use.  Cardiovascular: Regular rate and rhythm, no murmurs / rubs / gallops. No extremity edema.  Psychiatric: Normal judgment and insight. Alert and oriented x 3. Normal mood.    Impression and Plan:  Essential hypertension - Plan: amLODipine (NORVASC) 5 MG tablet  -Blood pressure remains elevated despite increasing dose of Hyzaar at last visit 8 weeks ago. -Continue Hyzaar, add amlodipine 5 mg, continue ambulatory blood pressure measurements and return in 6 to 8 weeks for follow-up.  Time spent:31 minutes reviewing chart, interviewing and examining patient and formulating plan of care.      [name], MD [facility]

## 2022-01-10 ENCOUNTER — Other Ambulatory Visit: Payer: Self-pay | Admitting: Internal Medicine

## 2022-01-10 DIAGNOSIS — E559 Vitamin D deficiency, unspecified: Secondary | ICD-10-CM

## 2022-01-21 ENCOUNTER — Other Ambulatory Visit: Payer: Self-pay | Admitting: Internal Medicine

## 2022-01-21 DIAGNOSIS — I10 Essential (primary) hypertension: Secondary | ICD-10-CM | POA: Diagnosis not present

## 2022-01-21 DIAGNOSIS — M1990 Unspecified osteoarthritis, unspecified site: Secondary | ICD-10-CM | POA: Diagnosis not present

## 2022-01-22 DIAGNOSIS — M1990 Unspecified osteoarthritis, unspecified site: Secondary | ICD-10-CM | POA: Diagnosis not present

## 2022-01-22 DIAGNOSIS — I10 Essential (primary) hypertension: Secondary | ICD-10-CM | POA: Diagnosis not present

## 2022-02-04 DIAGNOSIS — H25812 Combined forms of age-related cataract, left eye: Secondary | ICD-10-CM | POA: Diagnosis not present

## 2022-02-04 DIAGNOSIS — H269 Unspecified cataract: Secondary | ICD-10-CM | POA: Diagnosis not present

## 2022-02-04 DIAGNOSIS — H2512 Age-related nuclear cataract, left eye: Secondary | ICD-10-CM | POA: Diagnosis not present

## 2022-02-04 DIAGNOSIS — H25012 Cortical age-related cataract, left eye: Secondary | ICD-10-CM | POA: Diagnosis not present

## 2022-02-07 ENCOUNTER — Ambulatory Visit (HOSPITAL_BASED_OUTPATIENT_CLINIC_OR_DEPARTMENT_OTHER): Payer: Medicare Other | Admitting: Internal Medicine

## 2022-02-07 ENCOUNTER — Encounter (HOSPITAL_BASED_OUTPATIENT_CLINIC_OR_DEPARTMENT_OTHER): Payer: Self-pay | Admitting: Internal Medicine

## 2022-02-07 VITALS — BP 134/82 | HR 83 | Ht 64.0 in | Wt 194.1 lb

## 2022-02-07 DIAGNOSIS — M791 Myalgia, unspecified site: Secondary | ICD-10-CM | POA: Diagnosis not present

## 2022-02-07 DIAGNOSIS — E785 Hyperlipidemia, unspecified: Secondary | ICD-10-CM

## 2022-02-07 DIAGNOSIS — T466X5D Adverse effect of antihyperlipidemic and antiarteriosclerotic drugs, subsequent encounter: Secondary | ICD-10-CM

## 2022-02-07 NOTE — Progress Notes (Signed)
LIPID CLINIC CONSULT NOTE  Chief Complaint:  Follow-up dyslipidemia  Primary Care Physician: [name], [name], MD  Primary Cardiologist:  None  HPI:  Vanessa Price is a 66 y.o. female who is being seen today for the evaluation of dyslipidemia at the request of [name], [place] [facility]*.  This is a pleasant 66 year old female kindly referred by Dr. Jerilee Hoh for evaluation and management of dyslipidemia, specifically elevated triglycerides.  Most recently lipid profile showed total cholesterol of 249, triglycerides 293 (as high as 774 in the past), HDL 39, and LDL of 174.  Unfortunately, she has been intolerant to statins, having failed multiple statins in the past over 20+ years of therapy.  She is currently on fenofibrate which has resulted in marked improvement in her triglycerides however again her LDL cholesterol remains quite high.  There is no known coronary disease.  There is a family history of heart disease.  10/12/2019  Vanessa Price returns today for follow-up.  She had coronary calcium scoring which was 0 although has other risk factors including hypertension, obesity, prediabetes and at least intermediate cardiovascular risk factors.  I recommended a target LDL less than 100.  She was started on bempedoic acid (Nexletol) due to the fact that she did not tolerate multiple statins due to myalgia as well as ezetimibe.  She has had some reduction in her cholesterol however not significant.  Total cholesterol is now 2 and 37, triglycerides 279, HDL 32 and LDL 153 (down from 177).  Since she is not able to reach target, I feel that we will need additional therapy.  She is also on fenofibrate 145 mg daily.  08/01/2020  Vanessa Price is seen today in follow-up.  Her cholesterol has come down substantially with the addition of Repatha.  Direct LDL is now 41, total cholesterol 111, triglycerides remain elevated at 320 and HDL low at 38.  She reports that the  bempedoic acid is quite expensive.  02/04/2021  Vanessa Price returns today for follow-up.  Her cholesterol has come down nicely on combination of Repatha and Lovaza.  She is no longer taking fenofibrate.  Total cholesterol 147, HDL 44, triglycerides 162 and LDL 75.  Overall she seems to be tolerating the medicine well.  She was concerned about recently having some hair loss.  This appears to be more in a female pattern hair loss and I told her that its not a side effect of Repatha.  02/07/2022  Vanessa Price is seen today in follow-up.  Overall she is doing well.  She had repeat lipids drawn.  Her total cholesterol is now 152, triglycerides are slightly higher at 181, HDL 40 and LDL 75.  Overall still pretty good control.  She reports being a little less active than previous which may account for this.  We discussed checking LP(a) today which has not yet been assessed.  Fortunately she had no coronary calcium.  PMHx:  Past Medical History:  Diagnosis Date   ANXIETY 06/04/2009   GERD 08/14/2008   occasional   Hx of adenomatous polyp of colon 03/11/2018   HYPERLIPIDEMIA 09/14/2006   HYPERTENSION 09/14/2006   Impaired glucose tolerance    MYOCARDIAL PERFUSION SCAN, WITH STRESS TEST, ABNORMAL 10/19/2008   PREMATURE VENTRICULAR CONTRACTIONS 08/14/2008    Past Surgical History:  Procedure Laterality Date   ABDOMINAL HYSTERECTOMY  1999   menorrhagia/partial   CESAREAN SECTION     2 times    FAMHx:  Family History  Problem Relation Age of Onset  Arthritis Mother    Diabetes Mother    Heart disease Mother    Mental illness Father    Cancer Brother        Lung    SOCHx:   reports that she quit smoking about 28 years ago. Her smoking use included cigarettes. She has never used smokeless tobacco. She reports that she does not currently use alcohol. She reports that she does not use drugs.  ALLERGIES:  Allergies  Allergen Reactions   Lisinopril     REACTION: cough    Risperidone     REACTION: unsure of dose; pt describes reaction of bad cough    ROS: Pertinent items noted in HPI and remainder of comprehensive ROS otherwise negative.  HOME MEDS: Current Outpatient Medications on File Prior to Visit  Medication Sig Dispense Refill   amLODipine (NORVASC) 5 MG tablet Take 1 tablet (5 mg total) by mouth daily. 90 tablet 1   Evolocumab (REPATHA SURECLICK) 062 MG/ML SOAJ INJECT 1 DOSE INTO THE SKIN EVERY 14 DAYS 2 mL 11   losartan-hydrochlorothiazide (HYZAAR) 100-25 MG tablet TAKE 1 TABLET BY MOUTH DAILY 100 tablet 1   Multiple Vitamins-Minerals (ONE DAILY WOMENS 50 PLUS) TABS Take by mouth. Vitacost Women's 50+ multivitamin     naproxen sodium (ALEVE) 220 MG tablet Take 220 mg by mouth as needed.      No current facility-administered medications on file prior to visit.    LABS/IMAGING: No results found for this or any previous visit (from the past 48 hour(s)). No results found.  LIPID PANEL:    Component Value Date/Time   CHOL 152 11/13/2021 1134   CHOL 147 01/28/2021 0947   TRIG 181.0 (H) 11/13/2021 1134   TRIG 142 03/03/2006 0906   HDL 40.80 11/13/2021 1134   HDL 44 01/28/2021 0947   CHOLHDL 4 11/13/2021 1134   VLDL 36.2 11/13/2021 1134   LDLCALC 75 11/13/2021 1134   LDLCALC 75 01/28/2021 0947   LDLDIRECT 41.0 05/31/2020 0854    WEIGHTS: Wt Readings from Last 3 Encounters:  02/07/22 194 lb 1.6 oz (88 kg)  01/08/22 196 lb 9.6 oz (89.2 kg)  11/13/21 196 lb 3.2 oz (89 kg)    VITALS: BP 134/82 (BP Location: Left Arm, Patient Position: Sitting, Cuff Size: Large)   Pulse 83   Ht '5\' 4"'$  (1.626 m)   Wt 194 lb 1.6 oz (88 kg)   SpO2 98%   BMI 33.32 kg/m   EXAM: Deferred  EKG: Deferred  ASSESSMENT: Mixed dyslipidemia, possible familial combined hyperlipidemia [facility]) Intermediate 10 year risk (goal LDL <100) Hypertension Statin and zetia intolerance-myalgias CAC score (03/2019) Hepatic steatosis  PLAN: 1.   Vanessa Price is  doing well on her current regimen.  Her cholesterol is slightly higher but this is likely related to lifestyle factors.  I would continue her current medications.  We will check an LP(a) today.  This may be helpful for screening in the family.  I will continue to keep her target LDL less than 100 although she is quite close to 70.  She had no coronary calcium.  Follow-up with me annually or sooner as necessary.  Pixie Casino, MD, [facility], [place] Director of the Advanced Lipid Disorders &  Cardiovascular Risk Reduction Clinic Diplomate of the American Board of Clinical Lipidology Attending Cardiologist  Direct Dial: [phone]  Fax: [phone]  Website:  [email or web address] Marshawn Normoyle 02/07/2022, 8:13 AM

## 2022-02-07 NOTE — Patient Instructions (Signed)
Medication Instructions:  NO CHANGES  *If you need a refill on your cardiac medications before your next appointment, please call your pharmacy*   Lab Work: LPa -- with labs from PCP  If you have labs (blood work) drawn today and your tests are completely normal, you will receive your results only by: [place] (if you have MyChart) OR A paper copy in the mail If you have any lab test that is abnormal or we need to change your treatment, we will call you to review the results.   Testing/Procedures: NONE   Follow-Up: At [facility], you and your health needs are our priority.  As part of our continuing mission to provide you with exceptional heart care, we have created designated Provider Care Teams.  These Care Teams include your primary Cardiologist (physician) and Advanced Practice Providers (APPs -  Physician Assistants and Nurse Practitioners) who all work together to provide you with the care you need, when you need it.  We recommend signing up for the patient portal called "MyChart".  Sign up information is provided on this After Visit Summary.  MyChart is used to connect with patients for Virtual Visits (Telemedicine).  Patients are able to view lab/test results, encounter notes, upcoming appointments, etc.  Non-urgent messages can be sent to your provider as well.   To learn more about what you can do with MyChart, go to [email or web address].    Your next appointment:   12 month(s)  The format for your next appointment:   In Person  Provider:   [name] MD

## 2022-02-20 DIAGNOSIS — M1990 Unspecified osteoarthritis, unspecified site: Secondary | ICD-10-CM | POA: Diagnosis not present

## 2022-02-20 DIAGNOSIS — I10 Essential (primary) hypertension: Secondary | ICD-10-CM | POA: Diagnosis not present

## 2022-02-21 DIAGNOSIS — I10 Essential (primary) hypertension: Secondary | ICD-10-CM | POA: Diagnosis not present

## 2022-02-21 DIAGNOSIS — M1990 Unspecified osteoarthritis, unspecified site: Secondary | ICD-10-CM | POA: Diagnosis not present

## 2022-02-25 ENCOUNTER — Ambulatory Visit: Payer: Medicare Other | Admitting: Internal Medicine

## 2022-02-27 ENCOUNTER — Ambulatory Visit: Payer: Medicare Other | Admitting: Internal Medicine

## 2022-03-05 ENCOUNTER — Telehealth: Payer: Self-pay | Admitting: Pharmacist

## 2022-03-05 NOTE — Chronic Care Management (AMB) (Signed)
    Chronic Care Management Pharmacy Assistant   Name: Vanessa Price  MRN: [id number] DOB: January 10, 1956  03/10/2022 APPOINTMENT REMINDER  Vanessa Price was reminded on 03/07/22 to have all medications, supplements and any blood glucose and blood pressure readings available for review with [name], Pharm. D, at her telephone visit on 03/10/2022 at 8:30.  Care Gaps: AWV - 09/06/21 message to [name] Last BP - 134/82 on 02/07/2022 AWV - never done Covid - overdue Flu - postponed Pneumonia - postponed  Star Rating Drug: Losartan HCTZ 100/25 mg - last filled 02/27/2022 100 DS with [facility]   Any gaps in medications fill history? No  [name] [facility]  Catering manager [phone]

## 2022-03-06 ENCOUNTER — Ambulatory Visit (INDEPENDENT_AMBULATORY_CARE_PROVIDER_SITE_OTHER): Payer: Medicare Other | Admitting: Internal Medicine

## 2022-03-06 ENCOUNTER — Encounter: Payer: Self-pay | Admitting: Internal Medicine

## 2022-03-06 VITALS — BP 120/78 | HR 88 | Temp 98.0°F | Wt 194.3 lb

## 2022-03-06 DIAGNOSIS — E559 Vitamin D deficiency, unspecified: Secondary | ICD-10-CM | POA: Diagnosis not present

## 2022-03-06 DIAGNOSIS — E785 Hyperlipidemia, unspecified: Secondary | ICD-10-CM

## 2022-03-06 DIAGNOSIS — I10 Essential (primary) hypertension: Secondary | ICD-10-CM | POA: Diagnosis not present

## 2022-03-06 LAB — VITAMIN D 25 HYDROXY (VIT D DEFICIENCY, FRACTURES): VITD: 51.91 ng/mL (ref 30.00–100.00)

## 2022-03-06 NOTE — Progress Notes (Signed)
Established Patient Office Visit     CC/Reason for Visit: Follow-up chronic conditions  HPI: Vanessa Price is a 66 y.o. female who is coming in today for the above mentioned reasons. Past Medical History is significant for: Hypertension, hyperlipidemia, impaired glucose tolerance, OSA, obesity, vitamin D deficiency.  She is here today to follow-up on blood pressure.  She is also due for lab work.  She feels well and has no acute concerns.   Past Medical/Surgical History: Past Medical History:  Diagnosis Date   ANXIETY 06/04/2009   GERD 08/14/2008   occasional   Hx of adenomatous polyp of colon 03/11/2018   HYPERLIPIDEMIA 09/14/2006   HYPERTENSION 09/14/2006   Impaired glucose tolerance    MYOCARDIAL PERFUSION SCAN, WITH STRESS TEST, ABNORMAL 10/19/2008   PREMATURE VENTRICULAR CONTRACTIONS 08/14/2008    Past Surgical History:  Procedure Laterality Date   ABDOMINAL HYSTERECTOMY  1999   menorrhagia/partial   CESAREAN SECTION     2 times    Social History:  reports that she quit smoking about 28 years ago. Her smoking use included cigarettes. She has never used smokeless tobacco. She reports that she does not currently use alcohol. She reports that she does not use drugs.  Allergies: Allergies  Allergen Reactions   Lisinopril     REACTION: cough   Risperidone     REACTION: unsure of dose; pt describes reaction of bad cough    Family History:  Family History  Problem Relation Age of Onset   Arthritis Mother    Diabetes Mother    Heart disease Mother    Mental illness Father    Cancer Brother        Lung     Current Outpatient Medications:    amLODipine (NORVASC) 5 MG tablet, Take 1 tablet (5 mg total) by mouth daily., Disp: 90 tablet, Rfl: 1   Evolocumab (REPATHA SURECLICK) 756 MG/ML SOAJ, INJECT 1 DOSE INTO THE SKIN EVERY 14 DAYS, Disp: 2 mL, Rfl: 11   losartan-hydrochlorothiazide (HYZAAR) 100-25 MG tablet, TAKE 1 TABLET BY MOUTH DAILY, Disp: 100  tablet, Rfl: 1   Multiple Vitamins-Minerals (ONE DAILY WOMENS 50 PLUS) TABS, Take by mouth. Vitacost Women's 50+ multivitamin, Disp: , Rfl:    naproxen sodium (ALEVE) 220 MG tablet, Take 220 mg by mouth as needed. , Disp: , Rfl:   Review of Systems:  Constitutional: Denies fever, chills, diaphoresis, appetite change and fatigue.  HEENT: Denies photophobia, eye pain, redness, hearing loss, ear pain, congestion, sore throat, rhinorrhea, sneezing, mouth sores, trouble swallowing, neck pain, neck stiffness and tinnitus.   Respiratory: Denies SOB, DOE, cough, chest tightness,  and wheezing.   Cardiovascular: Denies chest pain, palpitations and leg swelling.  Gastrointestinal: Denies nausea, vomiting, abdominal pain, diarrhea, constipation, blood in stool and abdominal distention.  Genitourinary: Denies dysuria, urgency, frequency, hematuria, flank pain and difficulty urinating.  Endocrine: Denies: hot or cold intolerance, sweats, changes in hair or nails, polyuria, polydipsia. Musculoskeletal: Denies myalgias, back pain, joint swelling, arthralgias and gait problem.  Skin: Denies pallor, rash and wound.  Neurological: Denies dizziness, seizures, syncope, weakness, light-headedness, numbness and headaches.  Hematological: Denies adenopathy. Easy bruising, personal or family bleeding history  Psychiatric/Behavioral: Denies suicidal ideation, mood changes, confusion, nervousness, sleep disturbance and agitation    Physical Exam: Vitals:   03/06/22 1315  BP: 120/78  Pulse: 88  Temp: 98 F (36.7 C)  TempSrc: Oral  SpO2: 99%  Weight: 194 lb 4.8 oz (88.1 kg)    Body  mass index is 33.35 kg/m.   Constitutional: NAD, calm, comfortable Eyes: PERRL, lids and conjunctivae normal ENMT: Mucous membranes are moist.  Respiratory: clear to auscultation bilaterally, no wheezing, no crackles. Normal respiratory effort. No accessory muscle use.  Cardiovascular: Regular rate and rhythm, no murmurs /  rubs / gallops. No extremity edema.   Psychiatric: Normal judgment and insight. Alert and oriented x 3. Normal mood.    Impression and Plan:  Hyperlipidemia, unspecified hyperlipidemia type - Plan: Lipoprotein A (LPA)  Essential hypertension  Vitamin D deficiency - Plan: VITAMIN D 25 Hydroxy (Vit-D Deficiency, Fractures)  -Blood pressure is well-controlled with addition of amlodipine 5 mg to her Hyzaar 100/25 mg. -Due for vitamin D recheck today. -I will order LP(a) as requested by Dr. Debara Pickett.  Time spent:31 minutes reviewing chart, interviewing and examining patient and formulating plan of care.      [name], MD [facility]

## 2022-03-07 NOTE — Progress Notes (Deleted)
Chronic Care Management Pharmacy Note  03/07/2022 Name:  Vanessa Price MRN:  [id number] DOB:  29-Sep-1955  Summary: TGs close to goal of < 150 BP not at goal < 140/90  Recommendations/Changes made from today's visit: -Recommended monitoring BP at home a few days a week -Recommended to bring BP cuff to next office visit to ensure accuracy  Plan: BP assessment in 3 months Follow up in 6 months  Subjective: Vanessa Price is an 66 y.o. year old female who is a primary patient of [name], [name], MD.  The CCM team was consulted for assistance with disease management and care coordination needs.    Engaged with patient by telephone for follow up visit in response to provider referral for pharmacy case management and/or care coordination services.   Consent to Services:  The patient was given information about Chronic Care Management services, agreed to services, and gave verbal consent prior to initiation of services.  Please see initial visit note for detailed documentation.   Patient Care Team: [name], [name], MD as PCP - General (Internal Medicine) Viona Gilmore, [facility] as Pharmacist (Pharmacist)  Recent office visits: 04/15/21 Domingo Mend, MD: Patient presented for leg pain. Referred to PT. BP elevated in office and recommended 3 month follow up.  Recent consult visits: 06/17/21 Juel Burrow, PT (outpatient rehab): Patient presented for PT treatment for muscle weakness.  05/30/21 Larey Seat, MD (neurology): Patient presented for OSA initial visit. Continue with CPAP use and recommended avoiding sleeping on back. Follow up in 1 year.  02/04/2021 [name] MD (cardiology) - Patient was seen for dyslipidemia and additional issues. No medication changes. Follow up in 12 months.  Hospital visits: None in previous 6 months  Objective:  Lab Results  Component Value Date   CREATININE 0.70 11/13/2021   BUN 14 11/13/2021    GFR 89.11 11/13/2021   GFRNONAA >60 12/17/2020   GFRAA 96 01/04/2008   NA 141 11/13/2021   K 3.5 11/13/2021   CALCIUM 9.6 11/13/2021   CO2 28 11/13/2021   GLUCOSE 82 11/13/2021    Lab Results  Component Value Date/Time   HGBA1C 6.3 11/13/2021 11:34 AM   HGBA1C 6.1 (A) 04/15/2021 08:49 AM   HGBA1C 6.2 08/31/2020 08:05 AM   GFR 89.11 11/13/2021 11:34 AM   GFR 63.09 08/31/2020 08:05 AM    Last diabetic Eye exam: No results found for: "HMDIABEYEEXA"  Last diabetic Foot exam: No results found for: "HMDIABFOOTEX"   Lab Results  Component Value Date   CHOL 152 11/13/2021   HDL 40.80 11/13/2021   LDLCALC 75 11/13/2021   LDLDIRECT 41.0 05/31/2020   TRIG 181.0 (H) 11/13/2021   CHOLHDL 4 11/13/2021       Latest Ref Rng & Units 11/13/2021   11:34 AM 12/17/2020   11:36 AM 08/31/2020    8:05 AM  Hepatic Function  Total Protein 6.0 - 8.3 g/dL 7.3  7.4  6.8   Albumin 3.5 - 5.2 g/dL 4.5  4.4  4.5   AST 0 - 37 U/L _0 ALT 0 - 35 U/L _1 Alk Phosphatase 39 - 117 U/L 66  49  37   Total Bilirubin 0.2 - 1.2 mg/dL 0.3  0.5  0.4     Lab Results  Component Value Date/Time   TSH 1.51 11/13/2021 11:34 AM   TSH 2.89 08/31/2020 08:05 AM       Latest Ref Rng &  Units 11/13/2021   11:34 AM 12/17/2020   11:36 AM 08/31/2020    8:05 AM  CBC  WBC 4.0 - 10.5 K/uL 3.8  5.1  4.5   Hemoglobin 12.0 - 15.0 g/dL 13.4  14.3  13.5   Hematocrit 36.0 - 46.0 % 40.8  44.6  40.4   Platelets 150.0 - 400.0 K/uL 189.0  242  180.0     Lab Results  Component Value Date/Time   VD25OH 51.91 03/06/2022 01:36 PM   VD25OH 26.45 (L) 11/13/2021 11:34 AM    Clinical ASCVD: No  The 10-year ASCVD risk score (Arnett DK, et al., 2019) is: 7.5%   Values used to calculate the score:     Age: 73 years     Sex: Female     Is Non-Hispanic African American: Yes     Diabetic: No     Tobacco smoker: No     Systolic Blood Pressure: 921 mmHg     Is BP treated: Yes     HDL Cholesterol: 40.8 mg/dL      Total Cholesterol: 152 mg/dL       11/13/2021   11:06 AM 04/15/2021    8:26 AM 08/31/2020    7:24 AM  Depression screen PHQ 2/9  Decreased Interest 0 0 1  Down, Depressed, Hopeless 0 0 1  PHQ - 2 Score 0 0 2  Altered sleeping 0  1  Tired, decreased energy 1  0  Change in appetite 1  0  Feeling bad or failure about yourself  0  0  Trouble concentrating 0  0  Moving slowly or fidgety/restless 0  0  Suicidal thoughts 0  0  PHQ-9 Score 2  3  Difficult doing work/chores Somewhat difficult  Not difficult at all      Social History   Tobacco Use  Smoking Status Former   Types: Cigarettes   Quit date: 03/24/1993   Years since quitting: 28.9  Smokeless Tobacco Never   BP Readings from Last 3 Encounters:  03/06/22 120/78  02/07/22 134/82  01/08/22 (!) 145/90   Pulse Readings from Last 3 Encounters:  03/06/22 88  02/07/22 83  01/08/22 90   Wt Readings from Last 3 Encounters:  03/06/22 194 lb 4.8 oz (88.1 kg)  02/07/22 194 lb 1.6 oz (88 kg)  01/08/22 196 lb 9.6 oz (89.2 kg)   BMI Readings from Last 3 Encounters:  03/06/22 33.35 kg/m  02/07/22 33.32 kg/m  01/08/22 33.75 kg/m    Assessment/Interventions: Review of patient past medical history, allergies, medications, health status, including review of consultants reports, laboratory and other test data, was performed as part of comprehensive evaluation and provision of chronic care management services.   SDOH:  (Social Determinants of Health) assessments and interventions performed: No SDOH Interventions    Flowsheet Row Office Visit from 11/13/2021 in [facility] from 09/09/2021 in [facility] from 10/11/2019 in [facility]  SDOH Interventions     Transportation Interventions -- -- Intervention Not Indicated  Depression Interventions/Treatment  PHQ2-9 Score <4 Follow-up Not Indicated -- --  Financial Strain  Interventions -- Intervention Not Indicated Intervention Not Indicated      SDOH Screenings   Transportation Needs: No Transportation Needs (10/18/2019)  Depression (PHQ2-9): Low Risk  (11/13/2021)  Financial Resource Strain: Low Risk  (09/09/2021)  Tobacco Use: Medium Risk (03/06/2022)    CCM Care Plan  Allergies  Allergen Reactions   Lisinopril  REACTION: cough   Risperidone     REACTION: unsure of dose; pt describes reaction of bad cough    Medications Reviewed Today     Reviewed by [name], [name], MD (Physician) on 03/06/22 at 1336  Med List Status: <None>   Medication Order Taking? Sig Documenting Provider Last Dose Status Informant  amLODipine (NORVASC) 5 MG tablet [id number] Yes Take 1 tablet (5 mg total) by mouth daily. [name], [name], MD Taking Active   Evolocumab [facility] SURECLICK) 622 MG/ML Darden Palmer [id number] Yes INJECT 1 DOSE INTO THE SKIN EVERY 14 DAYS Hilty, Nadean Corwin, MD Taking Active   losartan-hydrochlorothiazide [facility]) 100-25 MG tablet [id number] Yes TAKE 1 TABLET BY MOUTH DAILY [name], [name], MD Taking Active   Multiple Vitamins-Minerals (ONE DAILY WOMENS 50 PLUS) TABS [id number] Yes Take by mouth. Vitacost Women's 50+ multivitamin [provider] Taking Active   naproxen sodium (ALEVE) 220 MG tablet [id number] Yes Take 220 mg by mouth as needed.  [provider] Taking Active             Patient Active Problem List   Diagnosis Date Noted   OSA on CPAP 05/30/2021   Moderate obstructive sleep apnea-hypopnea syndrome 02/25/2021   Obesity (BMI 30.0-34.9) 01/17/2021   Snoring 01/17/2021   Vitamin D deficiency 10/26/2018   Hx of adenomatous polyp of colon 03/11/2018   Constipation 02/10/2018   Impaired glucose tolerance 10/02/2015   ANXIETY 06/04/2009   MYOCARDIAL PERFUSION SCAN, WITH STRESS TEST, ABNORMAL 10/19/2008   PREMATURE VENTRICULAR CONTRACTIONS 08/14/2008   GERD 08/14/2008   Goiter 01/04/2008    Dyslipidemia 09/14/2006   Essential hypertension 09/14/2006    Immunization History  Administered Date(s) Administered   Influenza Split 12/24/2010, 12/17/2011   Influenza,inj,Quad PF,6+ Mos 01/07/2013   PFIZER(Purple Top)SARS-COV-2 Vaccination 07/19/2019, 08/09/2019, 02/21/2020   Tdap 08/17/2014   Zoster Recombinat (Shingrix) 10/26/2018, 02/10/2019   Patient reported that she is doing well right now. She reported that her Repatha is more expensive when she is in the donut hole but is not right now. She did inquire about patient assistance for Repatha. Patient has noticed cramping with use of fenofibrate and inquired about stopping. Discussed how this would likely impact her triglycerides but could consider switching fish oil to Vascepa for further benefit. Reached out to cardiologist.  Patient inquired about about getting a new BP cuff and recommended omron brand.  Conditions to be addressed/monitored:  Hypertension, Hyperlipidemia and constipation, pain, pre-diabetes  Conditions to be addressed the visit: Hyperlipidemia, hypertension  There are no care plans that you recently modified to display for this patient.    Medication Assistance:  Repatha and Nexletol obtained through [employer] medication assistance program.  Enrollment ends when grant is completed  Compliance/Adherence/Medication fill history: Care Gaps: Prevnar20, COVID booster Last BP - 141/80 on 05/30/2021  Star-Rating Drugs:  Losartan HCTZ 50/12.5 mg - last filled 07/23/2021 90 DS at [facility] verified with [name]   Patient's preferred pharmacy is:  [facility] ([place], [place] [place] [address] Phone: [phone] Fax: [phone]  [facility] [facility] ([place]), [place] - [address] [place] [address] Phone: [phone] Fax: [place], [place] [address] [place] [address] Phone: [phone] Fax: [phone]   Uses pill box? No - takes  all medications in the morning Pt endorses 100% compliance  We discussed: Current pharmacy is preferred with insurance plan and patient is satisfied with pharmacy services Patient decided to: Continue current medication management strategy  Care Plan and Follow Up Patient Decision:  Patient agrees to Care Plan and Follow-up.  Plan: Telephone follow up appointment with care management team member scheduled for:  6 months  [name], PharmD [place] Pharmacist [facility] [place] [phone]

## 2022-03-10 ENCOUNTER — Telehealth: Payer: Medicare Other

## 2022-03-11 ENCOUNTER — Telehealth: Payer: Self-pay | Admitting: Internal Medicine

## 2022-03-11 NOTE — Telephone Encounter (Signed)
PA for repatha submitted via CMM (Key: [id number])

## 2022-03-11 NOTE — Telephone Encounter (Signed)
Request Reference Number: [id number]. REPATHA SURE INJ '140MG'$ /ML is approved through 03/24/2023. Your patient may now fill this prescription and it will be covered.

## 2022-03-12 LAB — LIPOPROTEIN A (LPA): Lipoprotein (a): 87 nmol/L — ABNORMAL HIGH (ref <75)

## 2022-03-13 ENCOUNTER — Encounter: Payer: Self-pay | Admitting: Internal Medicine

## 2022-03-23 DIAGNOSIS — M1990 Unspecified osteoarthritis, unspecified site: Secondary | ICD-10-CM | POA: Diagnosis not present

## 2022-03-23 DIAGNOSIS — I10 Essential (primary) hypertension: Secondary | ICD-10-CM | POA: Diagnosis not present

## 2022-04-03 ENCOUNTER — Ambulatory Visit: Payer: Medicare Other | Admitting: Internal Medicine

## 2022-05-02 ENCOUNTER — Emergency Department (HOSPITAL_BASED_OUTPATIENT_CLINIC_OR_DEPARTMENT_OTHER): Payer: No Typology Code available for payment source | Admitting: Radiology

## 2022-05-02 ENCOUNTER — Emergency Department (HOSPITAL_BASED_OUTPATIENT_CLINIC_OR_DEPARTMENT_OTHER)
Admission: EM | Admit: 2022-05-02 | Discharge: 2022-05-02 | Disposition: A | Discharge status: Home / Self Care | Payer: No Typology Code available for payment source | Attending: Emergency Medicine | Admitting: Emergency Medicine

## 2022-05-02 ENCOUNTER — Other Ambulatory Visit: Payer: Self-pay

## 2022-05-02 ENCOUNTER — Encounter (HOSPITAL_BASED_OUTPATIENT_CLINIC_OR_DEPARTMENT_OTHER): Payer: Self-pay

## 2022-05-02 ENCOUNTER — Emergency Department (HOSPITAL_BASED_OUTPATIENT_CLINIC_OR_DEPARTMENT_OTHER): Payer: No Typology Code available for payment source

## 2022-05-02 DIAGNOSIS — Z79899 Other long term (current) drug therapy: Secondary | ICD-10-CM | POA: Diagnosis not present

## 2022-05-02 DIAGNOSIS — M47816 Spondylosis without myelopathy or radiculopathy, lumbar region: Secondary | ICD-10-CM | POA: Diagnosis not present

## 2022-05-02 DIAGNOSIS — I10 Essential (primary) hypertension: Secondary | ICD-10-CM | POA: Insufficient documentation

## 2022-05-02 DIAGNOSIS — M546 Pain in thoracic spine: Secondary | ICD-10-CM | POA: Insufficient documentation

## 2022-05-02 DIAGNOSIS — Y92410 Unspecified street and highway as the place of occurrence of the external cause: Secondary | ICD-10-CM | POA: Diagnosis not present

## 2022-05-02 DIAGNOSIS — M545 Low back pain, unspecified: Secondary | ICD-10-CM | POA: Insufficient documentation

## 2022-05-02 DIAGNOSIS — R519 Headache, unspecified: Secondary | ICD-10-CM | POA: Diagnosis not present

## 2022-05-02 DIAGNOSIS — S161XXA Strain of muscle, fascia and tendon at neck level, initial encounter: Secondary | ICD-10-CM | POA: Diagnosis not present

## 2022-05-02 DIAGNOSIS — M542 Cervicalgia: Secondary | ICD-10-CM | POA: Diagnosis not present

## 2022-05-02 DIAGNOSIS — M8588 Other specified disorders of bone density and structure, other site: Secondary | ICD-10-CM | POA: Diagnosis not present

## 2022-05-02 DIAGNOSIS — M4316 Spondylolisthesis, lumbar region: Secondary | ICD-10-CM | POA: Diagnosis not present

## 2022-05-02 MED ORDER — ACETAMINOPHEN 325 MG PO TABS
650.0000 mg | ORAL_TABLET | Freq: Once | ORAL | Status: AC
  Administered 2022-05-02: 650 mg via ORAL
  Filled 2022-05-02: qty 2

## 2022-05-02 MED ORDER — CYCLOBENZAPRINE HCL 5 MG PO TABS: 5.0000 mg | ORAL_TABLET | Freq: Three times a day (TID) | ORAL | 0 refills | Status: DC | PRN
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
## 2022-05-06 ENCOUNTER — Other Ambulatory Visit (HOSPITAL_COMMUNITY): Payer: Self-pay

## 2022-05-08 ENCOUNTER — Encounter (HOSPITAL_BASED_OUTPATIENT_CLINIC_OR_DEPARTMENT_OTHER): Payer: Self-pay | Admitting: Internal Medicine

## 2022-05-08 ENCOUNTER — Other Ambulatory Visit (HOSPITAL_COMMUNITY): Payer: Self-pay

## 2022-05-08 ENCOUNTER — Telehealth: Payer: Self-pay | Admitting: Internal Medicine

## 2022-05-08 NOTE — Telephone Encounter (Signed)
Pt c/o medication issue:  1. Name of Medication: Evolocumab (REPATHA SURECLICK) [id number] MG/ML SOAJ   2. How are you currently taking this medication (dosage and times per day)? INJECT 1 DOSE INTO THE SKIN EVERY 14 DAYS   3. Are you having a reaction (difficulty breathing--STAT)? no  4. What is your medication issue? Medication has be reject from her insurance medicare part d. Please advise

## 2022-05-09 ENCOUNTER — Other Ambulatory Visit (HOSPITAL_COMMUNITY): Payer: Self-pay

## 2022-05-09 NOTE — Telephone Encounter (Signed)
  05/08/2022  1:40 PM Supple, Harlon Flor, RPH-CPP Fidel Levy, RN Patient Calls    Comment: Do you know anything about this? Looks like you had an approval through 03/24/23 documented on 03/11/22. She sent in a MyChart message too that mentions [place] (looks to be a [employer] plan) - can you see if that's a different insurance plan than who the PA was submitted to in December?   Message sent to prior auth team for assistance

## 2022-05-12 MED ORDER — REPATHA SURECLICK 140 MG/ML ~~LOC~~ SOAJ: SUBCUTANEOUS | 11 refills | Status: DC

## 2022-05-12 NOTE — Telephone Encounter (Signed)
Great [place].  The appeal has now been approved.  Pharmacy Patient Advocate Encounter  Prior Authorization for Repatha 140MG/ML has been approved.    Effective dates: 2.1.24 through 05/11/23

## 2022-05-12 NOTE — Telephone Encounter (Signed)
Refill sent to pharmacy, pt made aware Repatha has been approved via MyChart message (also left message on her VM), spreadsheet updated with expiration date.

## 2022-05-14 NOTE — Patient Outreach (Deleted)
Care Management & Coordination Services Pharmacy Note  05/14/2022 Name:  Vanessa Price MRN:  [id number] DOB:  07-17-1955  Summary: ***  Recommendations/Changes made from today's visit: ***  Follow up plan: ***   Subjective: Vanessa Price is an 67 y.o. year old female who is a primary patient of [name], [name], MD.  The care coordination team was consulted for assistance with disease management and care coordination needs.    {CCMTELEPHONEFACETOFACE:21091510} for {CCMINITIALFOLLOWUPCHOICE:21091511}.  Recent office visits: ***  Recent consult visits: ***  Hospital visits: {Hospital DC Yes/No:25215}   Objective:  Lab Results  Component Value Date   CREATININE 0.70 11/13/2021   BUN 14 11/13/2021   GFR 89.11 11/13/2021   GFRNONAA >60 12/17/2020   GFRAA 96 01/04/2008   NA 141 11/13/2021   K 3.5 11/13/2021   CALCIUM 9.6 11/13/2021   CO2 28 11/13/2021   GLUCOSE 82 11/13/2021    Lab Results  Component Value Date/Time   HGBA1C 6.3 11/13/2021 11:34 AM   HGBA1C 6.1 (A) 04/15/2021 08:49 AM   HGBA1C 6.2 08/31/2020 08:05 AM   GFR 89.11 11/13/2021 11:34 AM   GFR 63.09 08/31/2020 08:05 AM    Last diabetic Eye exam: No results found for: "HMDIABEYEEXA"  Last diabetic Foot exam: No results found for: "HMDIABFOOTEX"   Lab Results  Component Value Date   CHOL 152 11/13/2021   HDL 40.80 11/13/2021   LDLCALC 75 11/13/2021   LDLDIRECT 41.0 05/31/2020   TRIG 181.0 (H) 11/13/2021   CHOLHDL 4 11/13/2021       Latest Ref Rng & Units 11/13/2021   11:34 AM 12/17/2020   11:36 AM 08/31/2020    8:05 AM  Hepatic Function  Total Protein 6.0 - 8.3 g/dL 7.3  7.4  6.8   Albumin 3.5 - 5.2 g/dL 4.5  4.4  4.5   AST 0 - 37 U/L '17  25  18   '$ ALT 0 - 35 U/L '18  26  20   '$ Alk Phosphatase 39 - 117 U/L 66  49  37   Total Bilirubin 0.2 - 1.2 mg/dL 0.3  0.5  0.4     Lab Results  Component Value Date/Time   TSH 1.51 11/13/2021 11:34 AM   TSH 2.89  08/31/2020 08:05 AM       Latest Ref Rng & Units 11/13/2021   11:34 AM 12/17/2020   11:36 AM 08/31/2020    8:05 AM  CBC  WBC 4.0 - 10.5 K/uL 3.8  5.1  4.5   Hemoglobin 12.0 - 15.0 g/dL 13.4  14.3  13.5   Hematocrit 36.0 - 46.0 % 40.8  44.6  40.4   Platelets 150.0 - 400.0 K/uL 189.0  242  180.0     Lab Results  Component Value Date/Time   VD25OH 51.91 03/06/2022 01:36 PM   VD25OH 26.45 (L) 11/13/2021 11:34 AM   VITAMINB12 580 11/13/2021 11:34 AM   VITAMINB12 851 08/31/2020 08:05 AM    Clinical ASCVD: {YES/NO:21197} The 10-year ASCVD risk score (Arnett DK, et al., 2019) is: 12.8%   Values used to calculate the score:     Age: 36 years     Sex: Female     Is Non-Hispanic African American: Yes     Diabetic: No     Tobacco smoker: No     Systolic Blood Pressure: [id number] mmHg     Is BP treated: Yes     HDL Cholesterol: 40.8 mg/dL     Total Cholesterol: 152 mg/dL    ***  Other: (CHADS2VASc if Afib, MMRC or CAT for COPD, ACT, DEXA)     11/13/2021   11:06 AM 04/15/2021    8:26 AM 08/31/2020    7:24 AM  Depression screen PHQ 2/9  Decreased Interest 0 0 1  Down, Depressed, Hopeless 0 0 1  PHQ - 2 Score 0 0 2  Altered sleeping 0  1  Tired, decreased energy 1  0  Change in appetite 1  0  Feeling bad or failure about yourself  0  0  Trouble concentrating 0  0  Moving slowly or fidgety/restless 0  0  Suicidal thoughts 0  0  PHQ-9 Score 2  3  Difficult doing work/chores Somewhat difficult  Not difficult at all     Social History   Tobacco Use  Smoking Status Former   Types: Cigarettes   Quit date: 03/24/1993   Years since quitting: 29.1  Smokeless Tobacco Never   BP Readings from Last 3 Encounters:  05/02/22 (!) 156/80  03/06/22 120/78  02/07/22 134/82   Pulse Readings from Last 3 Encounters:  05/02/22 77  03/06/22 88  02/07/22 83   Wt Readings from Last 3 Encounters:  05/02/22 194 lb 3.6 oz (88.1 kg)  03/06/22 194 lb 4.8 oz (88.1 kg)  02/07/22 194 lb 1.6 oz (88 kg)    BMI Readings from Last 3 Encounters:  05/02/22 33.34 kg/m  03/06/22 33.35 kg/m  02/07/22 33.32 kg/m    Allergies  Allergen Reactions   Lisinopril     REACTION: cough   Risperidone     REACTION: unsure of dose; pt describes reaction of bad cough    Medications Reviewed Today     Reviewed by Mercer Pod, RN (Registered Nurse) on 05/02/22 at [place] List Status: <None>   Medication Order Taking? Sig Documenting Provider Last Dose Status Informant  amLODipine (NORVASC) 5 MG tablet [id number]  Take 1 tablet (5 mg total) by mouth daily. [name], [name], MD  Active   Evolocumab [facility] SURECLICK) [id number] MG/ML Darden Palmer [id number]  INJECT 1 DOSE INTO THE SKIN EVERY 14 DAYS Debara Pickett Nadean Corwin, MD  Active   losartan-hydrochlorothiazide [facility]) 100-25 MG tablet [id number]  TAKE 1 TABLET BY MOUTH DAILY [name], [name], MD  Active   Multiple Vitamins-Minerals (ONE DAILY WOMENS 50 PLUS) TABS [id number]  Take by mouth. Vitacost Women's 50+ multivitamin [provider]  Active   naproxen sodium (ALEVE) 220 MG tablet [id number]  Take 220 mg by mouth as needed.  [provider]  Active             SDOH:  (Social Determinants of Health) assessments and interventions performed: {yes/no:20286} SDOH Interventions    Flowsheet Row Office Visit from 11/13/2021 in [place] at [place] Management from 09/09/2021 in [place] at [place] Management from 10/11/2019 in [place] at [place]  SDOH Interventions     Transportation Interventions -- -- Intervention Not Indicated  Depression Interventions/Treatment  PHQ2-9 Score <4 Follow-up Not Indicated -- --  Financial Strain Interventions -- Intervention Not Indicated Intervention Not Indicated       Medication Assistance: {MEDASSISTANCEINFO:25044}  Medication Access: Within the past 30 days, how often has patient  missed a dose of medication? *** Is a pillbox or other method used to improve adherence? {YES/NO:21197} Factors that may affect medication adherence? {CHL DESC; BARRIERS:21522} Are meds synced by current pharmacy? {YES/NO:21197} Are meds delivered by current pharmacy? {YES/NO:21197}  Does patient experience delays in picking up medications due to transportation concerns? {YES/NO:21197}  [facility]stream Services Reviewed: Is patient disadvantaged to use [facility]?: {YES/NO:21197} Current Rx insurance plan: *** Name and location of Current pharmacy:  [facility] ([place], [place] [facility] [address] [place] [address] Phone: [phone] Fax: [phone]  [place] ([place] - [address] [place] [address] Phone: [phone] Fax: [place] [place] [address] Phone: [phone] Fax: [phone]  [facility] services reviewed with patient today?: {YES/NO:21197} Patient requests to transfer care to [facility]?: {YES/NO:21197} Reason patient declined to change pharmacies: {US patient preference:27474}  Compliance/Adherence/Medication fill history: Care Gaps: ***  Star-Rating Drugs: ***   Assessment/Plan   {CCM PHARMD DISEASE STATES:25130}  ***
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
## 2022-05-20 ENCOUNTER — Telehealth: Payer: Self-pay

## 2022-05-20 NOTE — Progress Notes (Signed)
Care Management & Coordination Services Pharmacy Team  Reason for Encounter: Appointment Reminder  Contacted patient to confirm telephone appointment with Burman Riis, PharmD on 05/21/2022 at 10:30. Unsuccessful outreach. Left voicemail for patient to return call.   Care Gaps: AWV - completed 11/13/2021  Covid - overdue Flu - postpond Pneumovax - postponed   Star Rating Drug: Losartan HCTZ 100/25 mg - last filled 02/27/2022 100 DS with [place] Pharmacist Assistant [phone]

## 2022-05-21 ENCOUNTER — Telehealth: Payer: Self-pay | Admitting: *Deleted

## 2022-05-21 ENCOUNTER — Ambulatory Visit: Payer: Medicare Other

## 2022-05-21 DIAGNOSIS — I10 Essential (primary) hypertension: Secondary | ICD-10-CM

## 2022-05-21 MED ORDER — LOSARTAN POTASSIUM-HCTZ 100-25 MG PO TABS: 1.0000 | ORAL_TABLET | Freq: Every day | ORAL | 1 refills | Status: DC

## 2022-05-21 MED ORDER — AMLODIPINE BESYLATE 5 MG PO TABS: 5.0000 mg | ORAL_TABLET | Freq: Every day | ORAL | 1 refills | Status: DC
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
## 2022-05-21 NOTE — Telephone Encounter (Signed)
-----   Message from Maren Reamer, [facility] sent at 05/21/2022 10:51 AM EST ----- Regarding: Med Refill Patient is requesting refills for her Amlodipine 16m and Losartan-HCTZ 100-231mbe sent in to her [facility] order pharmacy[facility]  Phone: [phone]: [phone] Thank you!  AnKennebecharmacist [phone]
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
## 2022-05-21 NOTE — Telephone Encounter (Signed)
Refill sent.
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
## 2022-06-03 NOTE — Progress Notes (Unsigned)
PATIENT: Vanessa Price DOB: [birth year]  REASON FOR VISIT: follow up HISTORY FROM: patient PRIMARY NEUROLOGIST: Dr. Brett Fairy  Chief Complaint  Patient presents with   Follow-up    Pt in 26 Pt here for CPAP f/u Pt states dry nasal passage      HISTORY OF PRESENT ILLNESS: Today 06/03/22:  Vanessa Price is a 67 y.o. female with a history of obstructive sleep apnea on CPAP. Returns today for follow-up.  Her CPAP download indicates that she used her machine 20 out of 30 days for compliance of 66%.  She used her machine greater than 4 hours each night.  On average uses her machine 7 hours and 15 minutes.  Her residual AHI is 1.8 on 5 to 15 cm of water.  She reports overall the CPAP is working well for her.  Sometimes she does feel that her nasal passages are dry.  She has never tried adjusting the humidification.  She returns today for an evaluation.     REVIEW OF SYSTEMS: Out of a complete 14 system review of symptoms, the patient complains only of the following symptoms, and all other reviewed systems are negative.    ALLERGIES: Allergies  Allergen Reactions   Lisinopril     REACTION: cough   Risperidone     REACTION: unsure of dose; pt describes reaction of bad cough    HOME MEDICATIONS: Outpatient Medications Prior to Visit  Medication Sig Dispense Refill   amLODipine (NORVASC) 5 MG tablet Take 1 tablet (5 mg total) by mouth daily. 90 tablet 1   cyclobenzaprine (FLEXERIL) 5 MG tablet Take 1 tablet (5 mg total) by mouth 3 (three) times daily as needed for muscle spasms. 10 tablet 0   Evolocumab (REPATHA SURECLICK) XX123456 MG/ML SOAJ INJECT 1 DOSE INTO THE SKIN EVERY 14 DAYS 2 mL 11   losartan-hydrochlorothiazide (HYZAAR) 100-25 MG tablet Take 1 tablet by mouth daily. 100 tablet 1   Multiple Vitamins-Minerals (ONE DAILY WOMENS 50 PLUS) TABS Take by mouth. Vitacost Women's 50+ multivitamin     naproxen sodium (ALEVE) 220 MG tablet Take 220 mg by mouth as  needed.      No facility-administered medications prior to visit.    PAST MEDICAL HISTORY: Past Medical History:  Diagnosis Date   ANXIETY 06/04/2009   GERD 08/14/2008   occasional   Hx of adenomatous polyp of colon 03/11/2018   HYPERLIPIDEMIA 09/14/2006   HYPERTENSION 09/14/2006   Impaired glucose tolerance    MYOCARDIAL PERFUSION SCAN, WITH STRESS TEST, ABNORMAL 10/19/2008   PREMATURE VENTRICULAR CONTRACTIONS 08/14/2008    PAST SURGICAL HISTORY: Past Surgical History:  Procedure Laterality Date   ABDOMINAL HYSTERECTOMY  1999   menorrhagia/partial   CESAREAN SECTION     2 times    FAMILY HISTORY: Family History  Problem Relation Age of Onset   Arthritis Mother    Diabetes Mother    Heart disease Mother    Mental illness Father    Cancer Brother        Lung    SOCIAL HISTORY: Social History   Socioeconomic History   Marital status: Widowed    Spouse name: Not on file   Number of children: 2   Years of education: Not on file   Highest education level: Not on file  Occupational History   Not on file  Tobacco Use   Smoking status: Former    Types: Cigarettes    Quit date: 03/24/1993    Years since quitting: 29.2  Smokeless tobacco: Never  Vaping Use   Vaping Use: Never used  Substance and Sexual Activity   Alcohol use: Not Currently    Comment: social   Drug use: No   Sexual activity: Not Currently    Birth control/protection: Surgical    Comment: HYst-1st intercourse 67 yo-More than 5 partners, hysterectomy  Other Topics Concern   Not on file  Social History Narrative   Live with daughter   Right handed   Caffeine: some days non and sometimes 2 cups of coffee a week   Social Determinants of Health   Financial Resource Strain: Low Risk  (09/09/2021)   Overall Financial Resource Strain (CARDIA)    Difficulty of Paying Living Expenses: Not hard at all  Food Insecurity: No Food Insecurity (05/21/2022)   Hunger Vital Sign    Worried About Running Out  of Food in the Last Year: Never true    Ran Out of Food in the Last Year: Never true  Transportation Needs: No Transportation Needs (05/21/2022)   PRAPARE - Transportation    Lack of Transportation (Medical): No    Lack of Transportation (Non-Medical): No  Physical Activity: Not on file  Stress: Not on file  Social Connections: Not on file  Intimate Partner Violence: Not on file      PHYSICAL EXAM  Vitals:   06/04/22 1034  BP: (!) 144/77  Pulse: 75  Weight: 191 lb (86.6 kg)  Height: '5\' 4"'$  (1.626 m)   Body mass index is 32.79 kg/m.  Generalized: Well developed, in no acute distress  Chest: Lungs clear to auscultation bilaterally  Neurological examination  Mentation: Alert oriented to time, place, history taking. Follows all commands speech and language fluent Cranial nerve II-XII: Facial symmetry noted  DIAGNOSTIC DATA (LABS, IMAGING, TESTING) - I reviewed patient records, labs, notes, testing and imaging myself where available.  Lab Results  Component Value Date   WBC 3.8 (L) 11/13/2021   HGB 13.4 11/13/2021   HCT 40.8 11/13/2021   MCV 84.1 11/13/2021   PLT 189.0 11/13/2021      Component Value Date/Time   NA 141 11/13/2021 1134   K 3.5 11/13/2021 1134   CL 104 11/13/2021 1134   CO2 28 11/13/2021 1134   GLUCOSE 82 11/13/2021 1134   GLUCOSE 108 (H) 03/03/2006 0906   BUN 14 11/13/2021 1134   CREATININE 0.70 11/13/2021 1134   CALCIUM 9.6 11/13/2021 1134   PROT 7.3 11/13/2021 1134   PROT 7.4 10/07/2019 1113   ALBUMIN 4.5 11/13/2021 1134   ALBUMIN 4.8 10/07/2019 1113   AST 17 11/13/2021 1134   ALT 18 11/13/2021 1134   ALKPHOS 66 11/13/2021 1134   BILITOT 0.3 11/13/2021 1134   BILITOT 0.3 10/07/2019 1113   GFRNONAA >60 12/17/2020 1136   GFRAA 96 01/04/2008 0000   Lab Results  Component Value Date   CHOL 152 11/13/2021   HDL 40.80 11/13/2021   LDLCALC 75 11/13/2021   LDLDIRECT 41.0 05/31/2020   TRIG 181.0 (H) 11/13/2021   CHOLHDL 4 11/13/2021   Lab  Results  Component Value Date   HGBA1C 6.3 11/13/2021   Lab Results  Component Value Date   VITAMINB12 580 11/13/2021   Lab Results  Component Value Date   TSH 1.51 11/13/2021      ASSESSMENT AND PLAN 67 y.o. year old female  has a past medical history of ANXIETY (06/04/2009), GERD (08/14/2008), adenomatous polyp of colon (03/11/2018), HYPERLIPIDEMIA (09/14/2006), HYPERTENSION (09/14/2006), Impaired glucose tolerance, MYOCARDIAL PERFUSION SCAN, WITH  STRESS TEST, ABNORMAL (10/19/2008), and PREMATURE VENTRICULAR CONTRACTIONS (08/14/2008). here with:  OSA on CPAP  - CPAP compliance excellent - Good treatment of AHI  - Encourage patient to use CPAP nightly and > 4 hours each night -Adjust severe humidification today in the office and showed the patient how to do this at home - F/U in 1 year or sooner if needed    Ward Givens, MSN, NP-C 06/03/2022, 3:18 PM [facility] [address] [place], [place] [address] [phone]

## 2022-06-04 ENCOUNTER — Ambulatory Visit (INDEPENDENT_AMBULATORY_CARE_PROVIDER_SITE_OTHER): Payer: No Typology Code available for payment source | Admitting: Adult Health

## 2022-06-04 ENCOUNTER — Encounter: Payer: Self-pay | Admitting: Adult Health

## 2022-06-04 VITALS — BP 144/77 | HR 75 | Ht 64.0 in | Wt 191.0 lb

## 2022-06-04 DIAGNOSIS — G4733 Obstructive sleep apnea (adult) (pediatric): Secondary | ICD-10-CM

## 2022-06-06 DIAGNOSIS — G4733 Obstructive sleep apnea (adult) (pediatric): Secondary | ICD-10-CM | POA: Diagnosis not present

## 2022-06-30 NOTE — Progress Notes (Unsigned)
ACUTE VISIT No chief complaint on file.  HPI: [name] is a 67 y.o. female, who is here today complaining of *** HPI  Review of Systems See other pertinent positives and negatives in HPI.  Current Outpatient Medications on File Prior to Visit  Medication Sig Dispense Refill   amLODipine (NORVASC) 5 MG tablet Take 1 tablet (5 mg total) by mouth daily. 90 tablet 1   cyclobenzaprine (FLEXERIL) 5 MG tablet Take 1 tablet (5 mg total) by mouth 3 (three) times daily as needed for muscle spasms. 10 tablet 0   Evolocumab (REPATHA SURECLICK) 140 MG/ML SOAJ INJECT 1 DOSE INTO THE SKIN EVERY 14 DAYS 2 mL 11   losartan-hydrochlorothiazide (HYZAAR) 100-25 MG tablet Take 1 tablet by mouth daily. 100 tablet 1   Multiple Vitamin (MULTIVITAMIN PO) Take by mouth daily.     Multiple Vitamins-Minerals (ONE DAILY WOMENS 50 PLUS) TABS Take by mouth. Vitacost Women's 50+ multivitamin     naproxen sodium (ALEVE) 220 MG tablet Take 220 mg by mouth as needed.      No current facility-administered medications on file prior to visit.    Past Medical History:  Diagnosis Date   ANXIETY 06/04/2009   GERD 08/14/2008   occasional   Hx of adenomatous polyp of colon 03/11/2018   HYPERLIPIDEMIA 09/14/2006   HYPERTENSION 09/14/2006   Impaired glucose tolerance    MYOCARDIAL PERFUSION SCAN, WITH STRESS TEST, ABNORMAL 10/19/2008   PREMATURE VENTRICULAR CONTRACTIONS 08/14/2008   Allergies  Allergen Reactions   Lisinopril     REACTION: cough   Risperidone     REACTION: unsure of dose; pt describes reaction of bad cough    Social History   Socioeconomic History   Marital status: Widowed    Spouse name: Not on file   Number of children: 2   Years of education: Not on file   Highest education level: Not on file  Occupational History   Not on file  Tobacco Use   Smoking status: Former    Types: Cigarettes    Quit date: 03/24/1993    Years since quitting: 29.2   Smokeless tobacco: Never   Vaping Use   Vaping Use: Never used  Substance and Sexual Activity   Alcohol use: Not Currently    Comment: social   Drug use: No   Sexual activity: Not Currently    Birth control/protection: Surgical    Comment: HYst-1st intercourse 67 yo-More than 5 partners, hysterectomy  Other Topics Concern   Not on file  Social History Narrative   Live with daughter   Right handed   Caffeine: some days non and sometimes 2 cups of coffee a week   Social Determinants of Health   Financial Resource Strain: Low Risk  (09/09/2021)   Overall Financial Resource Strain (CARDIA)    Difficulty of Paying Living Expenses: Not hard at all  Food Insecurity: No Food Insecurity (05/21/2022)   Hunger Vital Sign    Worried About Running Out of Food in the Last Year: Never true    Ran Out of Food in the Last Year: Never true  Transportation Needs: No Transportation Needs (05/21/2022)   PRAPARE - Transportation    Lack of Transportation (Medical): No    Lack of Transportation (Non-Medical): No  Physical Activity: Not on file  Stress: Not on file  Social Connections: Not on file    There were no vitals filed for this visit. There is no height or weight on file to calculate BMI.  Physical Exam  ASSESSMENT AND PLAN: There are no diagnoses linked to this encounter.  No follow-ups on file.  Vanessa Kray G. Martinique, MD  [facility]. [place] office.  Discharge Instructions   None

## 2022-07-01 ENCOUNTER — Ambulatory Visit (INDEPENDENT_AMBULATORY_CARE_PROVIDER_SITE_OTHER): Payer: No Typology Code available for payment source | Admitting: Family Medicine

## 2022-07-01 ENCOUNTER — Encounter: Payer: Self-pay | Admitting: Family Medicine

## 2022-07-01 VITALS — BP 120/70 | HR 80 | Resp 16 | Ht 64.0 in | Wt 192.2 lb

## 2022-07-01 DIAGNOSIS — R1031 Right lower quadrant pain: Secondary | ICD-10-CM | POA: Diagnosis not present

## 2022-07-01 DIAGNOSIS — K5900 Constipation, unspecified: Secondary | ICD-10-CM

## 2022-07-01 MED ORDER — LINACLOTIDE 145 MCG PO CAPS: 145.0000 ug | ORAL_CAPSULE | Freq: Every day | ORAL | 0 refills | Status: DC

## 2022-07-01 NOTE — Patient Instructions (Addendum)
A few things to remember from today's visit:  RLQ abdominal pain  Constipation, unspecified constipation type - Plan: linaclotide (LINZESS) 145 MCG CAPS capsule I do not think we need labs today. Benafiber 1 tsp 2 times daily. Adequate hydration and fiber intake.  Linzess daily stated today, plan on staying home when you start medication, it can cause severe diarrhea.  Follow a bland diet for the next few days, small meals, adequate hydration.   GET HELP RIGHT AWAY IF:  Sudden severe/worsening pain. You keep throwing up (vomiting). Not being able to pass gas or poop. You have bloody or tarry looking poop.  MAKE SURE YOU:  Understand these instructions. Will watch your condition. Will get help right away if you are not doing well or get worse.  Please be sure medication list is accurate. If a new problem present, please set up appointment sooner than planned today.

## 2022-07-07 ENCOUNTER — Ambulatory Visit: Payer: No Typology Code available for payment source | Admitting: Internal Medicine

## 2022-07-10 ENCOUNTER — Telehealth: Payer: Self-pay

## 2022-07-10 NOTE — Progress Notes (Signed)
Care Management & Coordination Services Pharmacy Team  Reason for Encounter: Hypertension  Contacted patient to discuss hypertension disease state. Unsuccessful outreach. Left voicemail for patient to return call. Several attempts    Current antihypertensive regimen:  Amlodipine 5 mg daily Losartan HCTZ 100/25 mg daily Patient verbally confirms she is taking the above medications as directed.   How often are you checking your Blood Pressure?   she checks her blood pressure   taking her medication.  Current home BP readings:  DATE:             BP               PULSE  Wrist or arm cuff:  OTC medications including pseudoephedrine or NSAIDs?  Any readings above 180/100?  If yes any symptoms of hypertensive emergency?   What recent interventions/DTPs have been made by any provider to improve Blood Pressure control since last CPP Visit:   Any recent hospitalizations or ED visits since last visit with CPP?   What diet changes have been made to improve Blood Pressure Control?  Patient follows Breakfast -  Lunch -  Dinner -  Caffeine -  Salt -   What exercise is being done to improve your Blood Pressure Control?    Adherence Review: Is the patient currently on ACE/ARB medication? Yes Does the patient have >5 day gap between last estimated fill dates? No  Care Gaps: AWV - completed 11/13/2021  Last BP - 120/70 on 07/01/2022 Covid - overdue Pneumovax - postponed   Star Rating Drug: Losartan HCTZ 100/25 mg - last filled 06/13/2022 100 DS with [facility]   Chart Updates: Recent office visits:  07/01/2022 Betty Swaziland MD - Patient was seen for RLQ abdominal pain and an additional concern. Started Linzess 145 mcg before breakfast  Recent consult visits:  06/04/2022 Butch Penny NP(neurology) - Patient was seen for OSA on CPAP. No medication changes.   Hospital visits:  None  Medications: Outpatient Encounter Medications as of 07/10/2022  Medication Sig    amLODipine (NORVASC) 5 MG tablet Take 1 tablet (5 mg total) by mouth daily.   cyclobenzaprine (FLEXERIL) 5 MG tablet Take 1 tablet (5 mg total) by mouth 3 (three) times daily as needed for muscle spasms.   Evolocumab (REPATHA SURECLICK) 140 MG/ML SOAJ INJECT 1 DOSE INTO THE SKIN EVERY 14 DAYS   linaclotide (LINZESS) 145 MCG CAPS capsule Take 1 capsule (145 mcg total) by mouth daily before breakfast.   losartan-hydrochlorothiazide (HYZAAR) 100-25 MG tablet Take 1 tablet by mouth daily.   Multiple Vitamin (MULTIVITAMIN PO) Take by mouth daily.   Multiple Vitamins-Minerals (ONE DAILY WOMENS 50 PLUS) TABS Take by mouth. Vitacost Women's 50+ multivitamin   naproxen sodium (ALEVE) 220 MG tablet Take 220 mg by mouth as needed.    No facility-administered encounter medications on file as of 07/10/2022.  Fill History:   Dispensed Days Supply Quantity Provider Pharmacy  AMLODIPINE  TAB 06/13/2022 90 90 tablet      Dispensed Days Supply Quantity Provider Pharmacy  CYCLOBENZAPRINE  TABLETS 05/02/2022 3 10 each      Dispensed Days Supply Quantity Provider Pharmacy  REPATHA SURE /ML PEN INJ 06/20/2022 28 2 mL      Dispensed Days Supply Quantity Provider Pharmacy  LINZESS      CAP 07/01/2022 30 30 each      Dispensed Days Supply Quantity Provider Pharmacy  LOSARTAN/HCT 100-25 TAB 06/13/2022 100 100 tablet     Recent Office Vitals:  BP Readings from Last 3 Encounters:  07/01/22 120/70  06/04/22 (!) 144/77  05/02/22 (!) 156/80   Pulse Readings from Last 3 Encounters:  07/01/22 80  06/04/22 75  05/02/22 77    Wt Readings from Last 3 Encounters:  07/01/22 192 lb 4 oz (87.2 kg)  06/04/22 191 lb (86.6 kg)  05/02/22 194 lb 3.6 oz (88.1 kg)     Kidney Function Lab Results  Component Value Date/Time   CREATININE 0.70 11/13/2021 11:34 AM   CREATININE 0.81 12/17/2020 11:36 AM   GFR 89.11 11/13/2021 11:34 AM   GFRNONAA >60 12/17/2020 11:36 AM   GFRAA 96 01/04/2008 12:00 AM        Latest Ref Rng & Units 11/13/2021   11:34 AM 12/17/2020   11:36 AM 08/31/2020    8:05 AM  BMP  Glucose 70 - 99 mg/dL 82  098  119   BUN 6 - 23 mg/dL Creatinine 0.40 - 1.20 mg/dL 1.47  8.29  5.62   Sodium 135 - 145 mEq/L 141  137  144   Potassium 3.5 - 5.1 mEq/L 3.5  4.0  3.9   Chloride 96 - 112 mEq/L 104  106  108   CO2 19 - 32 mEq/L Calcium 8.4 - 10.5 mg/dL 9.6  9.4  9.8    Inetta Fermo [facility]  Clinical Pharmacist Assistant [phone]

## 2022-07-22 DIAGNOSIS — I10 Essential (primary) hypertension: Secondary | ICD-10-CM | POA: Diagnosis not present

## 2022-07-22 DIAGNOSIS — M1990 Unspecified osteoarthritis, unspecified site: Secondary | ICD-10-CM | POA: Diagnosis not present

## 2022-08-13 ENCOUNTER — Ambulatory Visit (INDEPENDENT_AMBULATORY_CARE_PROVIDER_SITE_OTHER): Payer: No Typology Code available for payment source | Admitting: Internal Medicine

## 2022-08-13 ENCOUNTER — Encounter: Payer: Self-pay | Admitting: Internal Medicine

## 2022-08-13 VITALS — BP 138/80 | HR 94 | Temp 99.1°F | Wt 190.8 lb

## 2022-08-13 DIAGNOSIS — L659 Nonscarring hair loss, unspecified: Secondary | ICD-10-CM | POA: Diagnosis not present

## 2022-08-13 DIAGNOSIS — K5900 Constipation, unspecified: Secondary | ICD-10-CM

## 2022-08-13 NOTE — Progress Notes (Signed)
Established Patient Office Visit     CC/Reason for Visit: Follow-up constipation and discuss hair loss  HPI: Vanessa Price is a 67 y.o. female who is coming in today for the above mentioned reasons.  She was seen last month by Dr. [place] for constipation.  She was prescribed Linzess.  She states she is 100% better.  She has started decreasing her use of Linzess to 2 to 3 days a week because she was having constant bowel movements.  She has increased her fluid and fiber consumption.  She is concerned about hair loss.  It has been happening for years but has worsened recently.   Past Medical/Surgical History: Past Medical History:  Diagnosis Date   ANXIETY 06/04/2009   GERD 08/14/2008   occasional   Hx of adenomatous polyp of colon 03/11/2018   HYPERLIPIDEMIA 09/14/2006   HYPERTENSION 09/14/2006   Impaired glucose tolerance    MYOCARDIAL PERFUSION SCAN, WITH STRESS TEST, ABNORMAL 10/19/2008   PREMATURE VENTRICULAR CONTRACTIONS 08/14/2008    Past Surgical History:  Procedure Laterality Date   ABDOMINAL HYSTERECTOMY  1999   menorrhagia/partial   CESAREAN SECTION     2 times    Social History:  reports that she quit smoking about 29 years ago. Her smoking use included cigarettes. She has never used smokeless tobacco. She reports that she does not currently use alcohol. She reports that she does not use drugs.  Allergies: Allergies  Allergen Reactions   Lisinopril     REACTION: cough   Risperidone     REACTION: unsure of dose; pt describes reaction of bad cough    Family History:  Family History  Problem Relation Age of Onset   Arthritis Mother    Diabetes Mother    Heart disease Mother    Mental illness Father    Cancer Brother        Lung   Sleep apnea Neg Hx      Current Outpatient Medications:    amLODipine (NORVASC) 5 MG tablet, Take 1 tablet (5 mg total) by mouth daily., Disp: 90 tablet, Rfl: 1   cyclobenzaprine (FLEXERIL) 5 MG tablet, Take 1  tablet (5 mg total) by mouth 3 (three) times daily as needed for muscle spasms., Disp: 10 tablet, Rfl: 0   Evolocumab (REPATHA SURECLICK) 140 MG/ML SOAJ, INJECT 1 DOSE INTO THE SKIN EVERY 14 DAYS, Disp: 2 mL, Rfl: 11   losartan-hydrochlorothiazide (HYZAAR) 100-25 MG tablet, Take 1 tablet by mouth daily., Disp: 100 tablet, Rfl: 1   Multiple Vitamin (MULTIVITAMIN PO), Take by mouth daily., Disp: , Rfl:    Multiple Vitamins-Minerals (ONE DAILY WOMENS 50 PLUS) TABS, Take by mouth. Vitacost Women's 50+ multivitamin, Disp: , Rfl:    naproxen sodium (ALEVE) 220 MG tablet, Take 220 mg by mouth as needed. , Disp: , Rfl:    linaclotide (LINZESS) 145 MCG CAPS capsule, Take 1 capsule (145 mcg total) by mouth daily before breakfast., Disp: 30 capsule, Rfl: 0  Review of Systems:  Negative unless indicated in HPI.   Physical Exam: Vitals:   08/13/22 0727 08/13/22 0731  BP: (!) 140/80 138/80  Pulse: 94   Temp: 99.1 F (37.3 C)   TempSrc: Oral   SpO2: 98%   Weight: 190 lb 12.8 oz (86.5 kg)     Body mass index is 32.75 kg/m.   Physical Exam Vitals reviewed.  Constitutional:      Appearance: Normal appearance.  HENT:     Head: Normocephalic and atraumatic.  Eyes:     Conjunctiva/sclera: Conjunctivae normal.     Pupils: Pupils are equal, round, and reactive to light.  Cardiovascular:     Rate and Rhythm: Normal rate and regular rhythm.  Pulmonary:     Effort: Pulmonary effort is normal.     Breath sounds: Normal breath sounds.  Skin:    General: Skin is warm and dry.  Neurological:     General: No focal deficit present.     Mental Status: She is alert and oriented to person, place, and time.  Psychiatric:        Mood and Affect: Mood normal.        Behavior: Behavior normal.        Thought Content: Thought content normal.        Judgment: Judgment normal.      Impression and Plan:  Hair loss -     Ambulatory referral to Dermatology  Constipation, unspecified constipation  type  -Constipation has improved with use of Linzess.  We have discussed increasing fluid consumption to at least 70 ounces of water a day.  She is only taking Linzess 2 to 3 days a week, I think that is okay as long as she is having bowel movements at least every other day. -Hair loss is of concern to her.  She is requesting dermatology referral which I will place today.  We have discussed taking a multivitamin and incorporating a biotin supplement which might help.   Time spent:30 minutes reviewing chart, interviewing and examining patient and formulating plan of care.     Chaya Jan, MD [facility] at [facility]

## 2022-08-22 DIAGNOSIS — I10 Essential (primary) hypertension: Secondary | ICD-10-CM | POA: Diagnosis not present

## 2022-08-22 DIAGNOSIS — M1990 Unspecified osteoarthritis, unspecified site: Secondary | ICD-10-CM | POA: Diagnosis not present

## 2022-09-16 ENCOUNTER — Encounter: Payer: Self-pay | Admitting: Internal Medicine

## 2022-09-29 ENCOUNTER — Telehealth: Payer: Self-pay | Admitting: Internal Medicine

## 2022-09-29 NOTE — Telephone Encounter (Signed)
Patient out of adherence for losartan-hydrochlorothiazide (HYZAAR) 100-25 MG tablet

## 2022-09-29 NOTE — Telephone Encounter (Signed)
Left message on machine for  [facility]  For more information

## 2022-10-10 ENCOUNTER — Ambulatory Visit (INDEPENDENT_AMBULATORY_CARE_PROVIDER_SITE_OTHER): Payer: No Typology Code available for payment source | Admitting: Family

## 2022-10-10 ENCOUNTER — Encounter: Payer: Self-pay | Admitting: Family

## 2022-10-10 VITALS — BP 127/76 | HR 102 | Temp 98.2°F | Ht 64.0 in | Wt 188.4 lb

## 2022-10-10 DIAGNOSIS — N3090 Cystitis, unspecified without hematuria: Secondary | ICD-10-CM | POA: Diagnosis not present

## 2022-10-10 LAB — POCT URINALYSIS DIPSTICK
Bilirubin, UA: NEGATIVE
Blood, UA: POSITIVE
Glucose, UA: NEGATIVE
Ketones, UA: NEGATIVE
Nitrite, UA: NEGATIVE
Protein, UA: POSITIVE — AB
Spec Grav, UA: 1.015 (ref 1.010–1.025)
Urobilinogen, UA: NEGATIVE E.U./dL — AB
pH, UA: 5.5 (ref 5.0–8.0)

## 2022-10-10 MED ORDER — NITROFURANTOIN MONOHYD MACRO 100 MG PO CAPS: 100.0000 mg | ORAL_CAPSULE | Freq: Two times a day (BID) | ORAL | 0 refills | Status: DC

## 2022-10-10 NOTE — Progress Notes (Signed)
Patient ID: Vanessa Price, female    DOB: [birth year], 67 y.o.   MRN: [id number]  Chief Complaint  Patient presents with   Urinary Frequency    Painful with urination     HPI:      Urinary sx:  pt reports having pelvic pain & pressure, frequency, urgency. Denies any hematuria, foul odor or back pain. Reports she has not had a UTI in years.      Assessment & Plan:  1. Cystitis-  sending Macrobid, advised on use & SE. Drink at least 2L water daily. Unable to send urine out for culture, lab closed, advised pt to let us know if sx not resolved after she finished the medication.  - POCT Urinalysis Dipstick - nitrofurantoin, macrocrystal-monohydrate, (MACROBID) 100 MG capsule; Take 1 capsule (100 mg total) by mouth 2 (two) times daily.  Dispense: 10 capsule; Refill: 0   Subjective:    Outpatient Medications Prior to Visit  Medication Sig Dispense Refill   amLODipine (NORVASC) 5 MG tablet Take 1 tablet (5 mg total) by mouth daily. 90 tablet 1   cyclobenzaprine (FLEXERIL) 5 MG tablet Take 1 tablet (5 mg total) by mouth 3 (three) times daily as needed for muscle spasms. 10 tablet 0   Evolocumab (REPATHA SURECLICK) 140 MG/ML SOAJ INJECT 1 DOSE INTO THE SKIN EVERY 14 DAYS 2 mL 11   losartan-hydrochlorothiazide (HYZAAR) 100-25 MG tablet Take 1 tablet by mouth daily. 100 tablet 1   Multiple Vitamins-Minerals (ONE DAILY WOMENS 50 PLUS) TABS Take by mouth. Vitacost Women's 50+ multivitamin     naproxen sodium (ALEVE) 220 MG tablet Take 220 mg by mouth as needed.      linaclotide (LINZESS) 145 MCG CAPS capsule Take 1 capsule (145 mcg total) by mouth daily before breakfast. 30 capsule 0   Multiple Vitamin (MULTIVITAMIN PO) Take by mouth daily.     No facility-administered medications prior to visit.   Past Medical History:  Diagnosis Date   ANXIETY 06/04/2009   GERD 08/14/2008   occasional   Hx of adenomatous polyp of colon 03/11/2018   HYPERLIPIDEMIA 09/14/2006   HYPERTENSION  09/14/2006   Impaired glucose tolerance    MYOCARDIAL PERFUSION SCAN, WITH STRESS TEST, ABNORMAL 10/19/2008   PREMATURE VENTRICULAR CONTRACTIONS 08/14/2008   Past Surgical History:  Procedure Laterality Date   ABDOMINAL HYSTERECTOMY  1999   menorrhagia/partial   CESAREAN SECTION     2 times   Allergies  Allergen Reactions   Lisinopril     REACTION: cough   Risperidone     REACTION: unsure of dose; pt describes reaction of bad cough      Objective:    Physical Exam Vitals and nursing note reviewed.  Constitutional:      Appearance: Normal appearance.  Cardiovascular:     Rate and Rhythm: Normal rate and regular rhythm.  Pulmonary:     Effort: Pulmonary effort is normal.     Breath sounds: Normal breath sounds.  Musculoskeletal:        General: Normal range of motion.  Skin:    General: Skin is warm and dry.  Neurological:     Mental Status: She is alert.  Psychiatric:        Mood and Affect: Mood normal.        Behavior: Behavior normal.    BP 127/76   Pulse (!) 102   Temp 98.2 F (36.8 C) (Temporal)   Ht 5\' 4"  (1.626 m)   Wt 188 lb 6.4 oz (  85.5 kg)   SpO2 99%   BMI 32.34 kg/m  Wt Readings from Last 3 Encounters:  10/10/22 188 lb 6.4 oz (85.5 kg)  08/13/22 190 lb 12.8 oz (86.5 kg)  07/01/22 192 lb 4 oz (87.2 kg)       Dulce Sellar, NP

## 2022-10-16 ENCOUNTER — Telehealth: Payer: Self-pay | Admitting: Adult Health

## 2022-10-16 DIAGNOSIS — G4733 Obstructive sleep apnea (adult) (pediatric): Secondary | ICD-10-CM | POA: Diagnosis not present

## 2022-10-16 NOTE — Telephone Encounter (Addendum)
Info was in snapshot incorrectly. Pt DME is not adapt. It is integrated. Order was placed and sent. Confirmation received.

## 2022-10-16 NOTE — Telephone Encounter (Signed)
Sending msg to DME

## 2022-10-16 NOTE — Addendum Note (Signed)
Addended by: Jacqualine Code D on: 10/16/2022 04:12 PM   Modules accepted: Orders

## 2022-10-16 NOTE — Telephone Encounter (Signed)
Pt states that with her having new insurance now when she reaches out to DME about problems she is having with her CPAP she is being told that a new order must be sent to DME re: her CPAP

## 2022-10-20 NOTE — Telephone Encounter (Signed)
Last OV notes and SS faxed to [phone]. Confirmation received.

## 2022-10-20 NOTE — Telephone Encounter (Signed)
[facility] [place]) We can not fix because did not come from our company, but issue her a new CPAP machine. We would need the sleep study report and last office notes faxed:  [phone].

## 2022-10-22 ENCOUNTER — Encounter (INDEPENDENT_AMBULATORY_CARE_PROVIDER_SITE_OTHER): Payer: Self-pay

## 2022-10-22 DIAGNOSIS — I10 Essential (primary) hypertension: Secondary | ICD-10-CM | POA: Diagnosis not present

## 2022-10-22 DIAGNOSIS — M1990 Unspecified osteoarthritis, unspecified site: Secondary | ICD-10-CM | POA: Diagnosis not present

## 2022-10-27 DIAGNOSIS — G4733 Obstructive sleep apnea (adult) (pediatric): Secondary | ICD-10-CM | POA: Diagnosis not present

## 2022-11-17 ENCOUNTER — Ambulatory Visit (INDEPENDENT_AMBULATORY_CARE_PROVIDER_SITE_OTHER): Payer: No Typology Code available for payment source

## 2022-11-17 ENCOUNTER — Telehealth: Payer: Self-pay

## 2022-11-17 ENCOUNTER — Encounter: Payer: Self-pay | Admitting: Internal Medicine

## 2022-11-17 ENCOUNTER — Ambulatory Visit (INDEPENDENT_AMBULATORY_CARE_PROVIDER_SITE_OTHER): Payer: No Typology Code available for payment source | Admitting: Internal Medicine

## 2022-11-17 VITALS — BP 110/78 | HR 97 | Temp 98.5°F | Ht 65.5 in | Wt 186.2 lb

## 2022-11-17 DIAGNOSIS — E785 Hyperlipidemia, unspecified: Secondary | ICD-10-CM

## 2022-11-17 DIAGNOSIS — E876 Hypokalemia: Secondary | ICD-10-CM

## 2022-11-17 DIAGNOSIS — I10 Essential (primary) hypertension: Secondary | ICD-10-CM | POA: Diagnosis not present

## 2022-11-17 DIAGNOSIS — Z0000 Encounter for general adult medical examination without abnormal findings: Secondary | ICD-10-CM

## 2022-11-17 DIAGNOSIS — E559 Vitamin D deficiency, unspecified: Secondary | ICD-10-CM | POA: Diagnosis not present

## 2022-11-17 DIAGNOSIS — R7302 Impaired glucose tolerance (oral): Secondary | ICD-10-CM

## 2022-11-17 LAB — COMPREHENSIVE METABOLIC PANEL
ALT: 27 U/L (ref 0–35)
AST: 18 U/L (ref 0–37)
Albumin: 3.8 g/dL (ref 3.5–5.2)
Alkaline Phosphatase: 75 U/L (ref 39–117)
BUN: 24 mg/dL — ABNORMAL HIGH (ref 6–23)
CO2: 29 mEq/L (ref 19–32)
Calcium: 9 mg/dL (ref 8.4–10.5)
Chloride: 95 mEq/L — ABNORMAL LOW (ref 96–112)
Creatinine, Ser: 1.21 mg/dL — ABNORMAL HIGH (ref 0.40–1.20)
GFR: 45.88 mL/min — ABNORMAL LOW (ref >60.00)
Glucose, Bld: 96 mg/dL (ref 70–99)
Potassium: 2.8 mEq/L — CL (ref 3.5–5.1)
Sodium: 136 mEq/L (ref 135–145)
Total Bilirubin: 0.7 mg/dL (ref 0.2–1.2)
Total Protein: 7.1 g/dL (ref 6.0–8.3)

## 2022-11-17 LAB — LIPID PANEL
Cholesterol: 147 mg/dL (ref 0–200)
HDL: 30.5 mg/dL — ABNORMAL LOW (ref >39.00)
LDL Cholesterol: 90 mg/dL (ref 0–99)
NonHDL: 116.68
Total CHOL/HDL Ratio: 5
Triglycerides: 135 mg/dL (ref 0.0–149.0)
VLDL: 27 mg/dL (ref 0.0–40.0)

## 2022-11-17 LAB — URINALYSIS, ROUTINE W REFLEX MICROSCOPIC
Bilirubin Urine: NEGATIVE
Ketones, ur: NEGATIVE
Nitrite: NEGATIVE
Specific Gravity, Urine: 1.025 (ref 1.000–1.030)
Total Protein, Urine: 30 — AB
Urine Glucose: NEGATIVE
Urobilinogen, UA: 0.2 (ref 0.0–1.0)
pH: 6 (ref 5.0–8.0)

## 2022-11-17 LAB — CBC WITH DIFFERENTIAL/PLATELET
Basophils Absolute: 0 10*3/uL (ref 0.0–0.1)
Basophils Relative: 0.2 % (ref 0.0–3.0)
Eosinophils Absolute: 0 10*3/uL (ref 0.0–0.7)
Eosinophils Relative: 0 % (ref 0.0–5.0)
HCT: 33.5 % — ABNORMAL LOW (ref 36.0–46.0)
Hemoglobin: 10.9 g/dL — ABNORMAL LOW (ref 12.0–15.0)
Lymphocytes Relative: 7.7 % — ABNORMAL LOW (ref 12.0–46.0)
Lymphs Abs: 1.2 10*3/uL (ref 0.7–4.0)
MCHC: 32.5 g/dL (ref 30.0–36.0)
MCV: 81.8 fl (ref 78.0–100.0)
Monocytes Absolute: 1.5 10*3/uL — ABNORMAL HIGH (ref 0.1–1.0)
Monocytes Relative: 10.1 % (ref 3.0–12.0)
Neutro Abs: 12.4 10*3/uL — ABNORMAL HIGH (ref 1.4–7.7)
Neutrophils Relative %: 82 % — ABNORMAL HIGH (ref 43.0–77.0)
Platelets: 252 10*3/uL (ref 150.0–400.0)
RBC: 4.1 Mil/uL (ref 3.87–5.11)
RDW: 16.1 % — ABNORMAL HIGH (ref 11.5–15.5)
WBC: 15.2 10*3/uL — ABNORMAL HIGH (ref 4.0–10.5)

## 2022-11-17 LAB — MAGNESIUM: Magnesium: 1.7 mg/dL (ref 1.5–2.5)

## 2022-11-17 LAB — VITAMIN D 25 HYDROXY (VIT D DEFICIENCY, FRACTURES): VITD: 27.88 ng/mL — ABNORMAL LOW (ref 30.00–100.00)

## 2022-11-17 LAB — HEMOGLOBIN A1C: Hgb A1c MFr Bld: 6.1 % (ref 4.6–6.5)

## 2022-11-17 LAB — TSH: TSH: 1.3 u[IU]/mL (ref 0.35–5.50)

## 2022-11-17 LAB — VITAMIN B12: Vitamin B-12: 749 pg/mL (ref 211–911)

## 2022-11-17 MED ORDER — POTASSIUM CHLORIDE CRYS ER 20 MEQ PO TBCR: 20.0000 meq | EXTENDED_RELEASE_TABLET | Freq: Two times a day (BID) | ORAL | 0 refills | Status: DC

## 2022-11-17 NOTE — Progress Notes (Signed)
Established Patient Office Visit     CC/Reason for Visit: Annual preventive exam and subsequent Medicare wellness visit  HPI: Vanessa Price is a 67 y.o. female who is coming in today for the above mentioned reasons. Past Medical History is significant for: Hypertension, hyperlipidemia, impaired glucose tolerance, obstructive sleep apnea, obesity and vitamin D deficiency.  Last month she was treated for UTI and feels like she has not recovered.  She feels very fatigued and remains with urinary frequency although no dysuria.  She has routine eye and dental care, no perceived hearing deficits.  She is overdue for pneumonia, flu, COVID, RSV vaccines.  Tdap and shingles are up-to-date.  She is now due for her screening mammogram and has an appointment next week, colonoscopy and Pap smear up-to-date.   Past Medical/Surgical History: Past Medical History:  Diagnosis Date   ANXIETY 06/04/2009   GERD 08/14/2008   occasional   Hx of adenomatous polyp of colon 03/11/2018   HYPERLIPIDEMIA 09/14/2006   HYPERTENSION 09/14/2006   Impaired glucose tolerance    MYOCARDIAL PERFUSION SCAN, WITH STRESS TEST, ABNORMAL 10/19/2008   PREMATURE VENTRICULAR CONTRACTIONS 08/14/2008    Past Surgical History:  Procedure Laterality Date   ABDOMINAL HYSTERECTOMY  1999   menorrhagia/partial   CESAREAN SECTION     2 times    Social History:  reports that she quit smoking about 29 years ago. Her smoking use included cigarettes. She has never used smokeless tobacco. She reports that she does not currently use alcohol. She reports that she does not use drugs.  Allergies: Allergies  Allergen Reactions   Lisinopril     REACTION: cough   Risperidone     REACTION: unsure of dose; pt describes reaction of bad cough    Family History:  Family History  Problem Relation Age of Onset   Arthritis Mother    Diabetes Mother    Heart disease Mother    Mental illness Father    Cancer Brother         Lung   Sleep apnea Neg Hx      Current Outpatient Medications:    amLODipine (NORVASC) 5 MG tablet, Take 1 tablet (5 mg total) by mouth daily., Disp: 90 tablet, Rfl: 1   Evolocumab (REPATHA SURECLICK) 140 MG/ML SOAJ, INJECT 1 DOSE INTO THE SKIN EVERY 14 DAYS, Disp: 2 mL, Rfl: 11   losartan-hydrochlorothiazide (HYZAAR) 100-25 MG tablet, Take 1 tablet by mouth daily., Disp: 100 tablet, Rfl: 1   Multiple Vitamins-Minerals (ONE DAILY WOMENS 50 PLUS) TABS, Take by mouth. Vitacost Women's 50+ multivitamin, Disp: , Rfl:    naproxen sodium (ALEVE) 220 MG tablet, Take 220 mg by mouth as needed. , Disp: , Rfl:   Review of Systems:  Negative unless indicated in HPI.   Physical Exam: Vitals:   11/17/22 0708  BP: 110/78  Pulse: 97  Temp: 98.5 F (36.9 C)  TempSrc: Oral  SpO2: 100%  Weight: 186 lb 3.2 oz (84.5 kg)  Height: 5' 5.5" (1.664 m)    Body mass index is 30.51 kg/m.   Physical Exam Vitals reviewed.  Constitutional:      General: She is not in acute distress.    Appearance: Normal appearance. She is not ill-appearing, toxic-appearing or diaphoretic.  HENT:     Head: Normocephalic.     Right Ear: Tympanic membrane, ear canal and external ear normal. There is no impacted cerumen.     Left Ear: Tympanic membrane, ear canal and external ear  normal. There is no impacted cerumen.     Nose: Nose normal.     Mouth/Throat:     Mouth: Mucous membranes are moist.     Pharynx: Oropharynx is clear. No oropharyngeal exudate or posterior oropharyngeal erythema.  Eyes:     General: No scleral icterus.       Right eye: No discharge.        Left eye: No discharge.     Conjunctiva/sclera: Conjunctivae normal.     Pupils: Pupils are equal, round, and reactive to light.  Neck:     Vascular: No carotid bruit.  Cardiovascular:     Rate and Rhythm: Normal rate and regular rhythm.     Pulses: Normal pulses.     Heart sounds: Normal heart sounds.  Pulmonary:     Effort: Pulmonary effort is  normal. No respiratory distress.     Breath sounds: Normal breath sounds.  Abdominal:     General: Abdomen is flat. Bowel sounds are normal.     Palpations: Abdomen is soft.  Musculoskeletal:        General: Normal range of motion.     Cervical back: Normal range of motion.  Skin:    General: Skin is warm and dry.  Neurological:     General: No focal deficit present.     Mental Status: She is alert and oriented to person, place, and time. Mental status is at baseline.  Psychiatric:        Mood and Affect: Mood normal.        Behavior: Behavior normal.        Thought Content: Thought content normal.        Judgment: Judgment normal.    Subsequent Medicare wellness visit   1. Risk factors, based on past  M,S,F -     2.  Physical activities: Dietary issues and exercise activities discussed:      3.  Depression/mood:  Flowsheet Row Office Visit from 11/13/2021 in [facility] Total Score 2        4.  ADL's:    11/17/2022    7:02 AM  In your present state of health, do you have any difficulty performing the following activities:  Hearing? 0  Vision? 0  Difficulty concentrating or making decisions? 0  Walking or climbing stairs? 0  Dressing or bathing? 0  Doing errands, shopping? 0  Preparing Food and eating ? N  Using the Toilet? N  In the past six months, have you accidently leaked urine? Y  Do you have problems with loss of bowel control? N  Managing your Medications? N  Managing your Finances? N  Housekeeping or managing your Housekeeping? N     5.  Fall risk:     11/13/2021   11:06 AM 03/06/2022    1:21 PM 08/13/2022    7:51 AM 10/10/2022   10:44 AM 11/17/2022    7:15 AM  Fall Risk  Falls in the past year? 1 0 0 0 1  Was there an injury with Fall? 0 0 0 0 1  Fall Risk Category Calculator 1 0 0 0 2  Fall Risk Category (Retired) Low Low     (RETIRED) Patient Fall Risk Level Low fall risk Low fall risk     Patient at  Risk for Falls Due to Impaired balance/gait No Fall Risks  No Fall Risks   Fall risk Follow up Falls evaluation completed Falls evaluation completed Falls evaluation  completed  Falls evaluation completed     6.  Home safety: No problems identified   7.  Height weight, and visual acuity: height and weight as above, vision/hearing: Vision Screening   Right eye Left eye Both eyes  Without correction 20/25 20/160 20/25  With correction        8.  Counseling: Counseling given: Not Answered    9. Lab orders based on risk factors: Laboratory update will be reviewed   10. Cognitive assessment:        11/17/2022    7:04 AM  6CIT Screen  What Year? 0 points  What month? 0 points  What time? 0 points  Count back from 20 0 points  Months in reverse 0 points  Repeat phrase 0 points  Total Score 0 points     11. Screening: Patient provided with a written and personalized 5-10 year screening schedule in the AVS. Health Maintenance  Topic Date Due   Pneumonia Vaccine (1 of 1 - PCV) Never done   Medicare Annual Wellness Visit  11/14/2022   Flu Shot  10/23/2022   Mammogram  12/18/2022*   DTaP/Tdap/Td vaccine (2 - Td or Tdap) 08/16/2024   Colon Cancer Screening  03/03/2025   DEXA scan (bone density measurement)  Completed   COVID-19 Vaccine  Completed   Hepatitis C Screening  Completed   Zoster (Shingles) Vaccine  Completed   HPV Vaccine  Aged Out  *Topic was postponed. The date shown is not the original due date.    12. Provider List Update: Patient Care Team    Relationship Specialty Notifications Start End  Philip Aspen, Limmie Patricia, MD PCP - General Internal Medicine  02/10/18   Sherrill Raring, [facility] (Inactive)  Pharmacist  05/21/22      13. Advance Directives: Does Patient Have a Medical Advance Directive?: Yes Type of Advance Directive: Living will Does patient want to make changes to medical advance directive?: No - Patient declined  14. Opioids: Patient is not on  any opioid prescriptions and has no risk factors for a substance use disorder.   15.   Goals      travel         I have personally reviewed and noted the following in the patient's chart:   Medical and social history Use of alcohol, tobacco or illicit drugs  Current medications and supplements Functional ability and status Nutritional status Physical activity Advanced directives List of other physicians Hospitalizations, surgeries, and ER visits in previous 12 months Vitals Screenings to include cognitive, depression, and falls Referrals and appointments  In addition, I have reviewed and discussed with patient certain preventive protocols, quality metrics, and best practice recommendations. A written personalized care plan for preventive services as well as general preventive health recommendations were provided to patient.   Impression and Plan:  Encounter for preventive health examination -     Urinalysis; Future  Essential hypertension -     CBC with Differential/Platelet; Future -     Comprehensive metabolic panel; Future  Hyperlipidemia, unspecified hyperlipidemia type -     Lipid panel; Future  Vitamin D deficiency -     TSH; Future -     Vitamin B12; Future -     VITAMIN D 25 Hydroxy (Vit-D Deficiency, Fractures); Future  Impaired glucose tolerance -     Hemoglobin A1c; Future  Dyslipidemia  -Recommend routine eye and dental care. -Healthy lifestyle discussed in detail. -Labs to be updated today. -Prostate cancer screening: N/A  Health Maintenance  Topic Date Due   Pneumonia Vaccine (1 of 1 - PCV) Never done   Medicare Annual Wellness Visit  11/14/2022   Flu Shot  10/23/2022   Mammogram  12/18/2022*   DTaP/Tdap/Td vaccine (2 - Td or Tdap) 08/16/2024   Colon Cancer Screening  03/03/2025   DEXA scan (bone density measurement)  Completed   COVID-19 Vaccine  Completed   Hepatitis C Screening  Completed   Zoster (Shingles) Vaccine  Completed   HPV  Vaccine  Aged Out  *Topic was postponed. The date shown is not the original due date.    -Advised to update flu, COVID, pneumonia, RSV vaccines.  Declines all today despite counseling. -Is scheduled for mammogram next week.    Chaya Jan, MD [facility] at [facility]

## 2022-11-17 NOTE — Telephone Encounter (Signed)
Patient is aware.  Rx sent.  Lab appointment and orders placed.

## 2022-11-17 NOTE — Telephone Encounter (Signed)
CRITICAL VALUE STICKER  CRITICAL VALUE: Potassium 2.8  RECEIVER (on-site recipient of call): Tay  DATE & TIME NOTIFIED: 11/17/22 11:20 am  MESSENGER (representative from lab): Hope  MD NOTIFIED: Ardyth Harps  TIME OF NOTIFICATION:11:21am  RESPONSE:

## 2022-11-17 NOTE — Telephone Encounter (Signed)
Add on sheet faxed and confirmed

## 2022-11-18 ENCOUNTER — Other Ambulatory Visit: Payer: Self-pay | Admitting: Internal Medicine

## 2022-11-18 ENCOUNTER — Other Ambulatory Visit: Payer: Self-pay | Admitting: *Deleted

## 2022-11-18 ENCOUNTER — Encounter: Payer: Self-pay | Admitting: Internal Medicine

## 2022-11-18 DIAGNOSIS — D509 Iron deficiency anemia, unspecified: Secondary | ICD-10-CM | POA: Insufficient documentation

## 2022-11-18 DIAGNOSIS — N179 Acute kidney failure, unspecified: Secondary | ICD-10-CM

## 2022-11-18 DIAGNOSIS — E559 Vitamin D deficiency, unspecified: Secondary | ICD-10-CM

## 2022-11-18 DIAGNOSIS — D649 Anemia, unspecified: Secondary | ICD-10-CM

## 2022-11-18 MED ORDER — VITAMIN D (ERGOCALCIFEROL) 1.25 MG (50000 UNIT) PO CAPS: 50000.0000 [IU] | ORAL_CAPSULE | ORAL | 0 refills | Status: AC

## 2022-11-19 ENCOUNTER — Other Ambulatory Visit: Payer: No Typology Code available for payment source

## 2022-11-19 ENCOUNTER — Ambulatory Visit: Payer: No Typology Code available for payment source

## 2022-11-19 ENCOUNTER — Ambulatory Visit (INDEPENDENT_AMBULATORY_CARE_PROVIDER_SITE_OTHER): Payer: No Typology Code available for payment source

## 2022-11-19 ENCOUNTER — Other Ambulatory Visit: Payer: Self-pay | Admitting: *Deleted

## 2022-11-19 ENCOUNTER — Telehealth: Payer: Self-pay | Admitting: Internal Medicine

## 2022-11-19 DIAGNOSIS — Z1231 Encounter for screening mammogram for malignant neoplasm of breast: Secondary | ICD-10-CM | POA: Diagnosis not present

## 2022-11-19 DIAGNOSIS — N179 Acute kidney failure, unspecified: Secondary | ICD-10-CM

## 2022-11-19 DIAGNOSIS — D649 Anemia, unspecified: Secondary | ICD-10-CM | POA: Diagnosis not present

## 2022-11-19 DIAGNOSIS — N390 Urinary tract infection, site not specified: Secondary | ICD-10-CM

## 2022-11-19 LAB — HM MAMMOGRAPHY

## 2022-11-19 LAB — IBC + FERRITIN
Ferritin: 487 ng/mL — ABNORMAL HIGH (ref 10.0–291.0)
Iron: 15 ug/dL — ABNORMAL LOW (ref 42–145)
Saturation Ratios: 5.7 % — ABNORMAL LOW (ref 20.0–50.0)
TIBC: 261.8 ug/dL (ref 250.0–450.0)
Transferrin: 187 mg/dL — ABNORMAL LOW (ref 212.0–360.0)

## 2022-11-19 NOTE — Telephone Encounter (Signed)
Spoke to patient and reviewed lab results. 

## 2022-11-19 NOTE — Telephone Encounter (Signed)
Pt returning call

## 2022-11-20 ENCOUNTER — Other Ambulatory Visit: Payer: Self-pay | Admitting: *Deleted

## 2022-11-20 ENCOUNTER — Ambulatory Visit (HOSPITAL_BASED_OUTPATIENT_CLINIC_OR_DEPARTMENT_OTHER)
Admission: RE | Admit: 2022-11-20 | Discharge: 2022-11-20 | Disposition: A | Discharge status: Home / Self Care | Payer: No Typology Code available for payment source | Source: Ambulatory Visit | Attending: Internal Medicine | Admitting: Internal Medicine

## 2022-11-20 DIAGNOSIS — I10 Essential (primary) hypertension: Secondary | ICD-10-CM | POA: Diagnosis not present

## 2022-11-20 DIAGNOSIS — N179 Acute kidney failure, unspecified: Secondary | ICD-10-CM | POA: Diagnosis not present

## 2022-11-20 DIAGNOSIS — Z008 Encounter for other general examination: Secondary | ICD-10-CM | POA: Diagnosis not present

## 2022-11-20 DIAGNOSIS — I700 Atherosclerosis of aorta: Secondary | ICD-10-CM | POA: Diagnosis not present

## 2022-11-20 DIAGNOSIS — R7303 Prediabetes: Secondary | ICD-10-CM | POA: Diagnosis not present

## 2022-11-20 DIAGNOSIS — N390 Urinary tract infection, site not specified: Secondary | ICD-10-CM | POA: Diagnosis not present

## 2022-11-20 DIAGNOSIS — E669 Obesity, unspecified: Secondary | ICD-10-CM | POA: Diagnosis not present

## 2022-11-20 DIAGNOSIS — F17211 Nicotine dependence, cigarettes, in remission: Secondary | ICD-10-CM | POA: Diagnosis not present

## 2022-11-20 DIAGNOSIS — E785 Hyperlipidemia, unspecified: Secondary | ICD-10-CM | POA: Diagnosis not present

## 2022-11-20 DIAGNOSIS — Z6831 Body mass index (BMI) 31.0-31.9, adult: Secondary | ICD-10-CM | POA: Diagnosis not present

## 2022-11-20 DIAGNOSIS — R2681 Unsteadiness on feet: Secondary | ICD-10-CM | POA: Diagnosis not present

## 2022-11-22 DIAGNOSIS — M1990 Unspecified osteoarthritis, unspecified site: Secondary | ICD-10-CM | POA: Diagnosis not present

## 2022-11-22 DIAGNOSIS — I10 Essential (primary) hypertension: Secondary | ICD-10-CM | POA: Diagnosis not present

## 2022-11-22 LAB — URINE CULTURE
MICRO NUMBER:: 15399989
SPECIMEN QUALITY:: ADEQUATE

## 2022-11-25 ENCOUNTER — Other Ambulatory Visit: Payer: No Typology Code available for payment source

## 2022-11-26 DIAGNOSIS — Z7184 Encounter for health counseling related to travel: Secondary | ICD-10-CM | POA: Diagnosis not present

## 2022-11-26 DIAGNOSIS — Z23 Encounter for immunization: Secondary | ICD-10-CM | POA: Diagnosis not present

## 2022-11-26 LAB — URINE CULTURE

## 2022-11-26 LAB — EXTRA URINE SPECIMEN

## 2022-11-27 ENCOUNTER — Telehealth: Payer: Self-pay | Admitting: *Deleted

## 2022-11-27 MED ORDER — SULFAMETHOXAZOLE-TRIMETHOPRIM 800-160 MG PO TABS: 1.0000 | ORAL_TABLET | Freq: Two times a day (BID) | ORAL | 0 refills | Status: DC

## 2022-11-27 NOTE — Telephone Encounter (Signed)
Rx sent and patient is aware. 

## 2022-11-27 NOTE — Telephone Encounter (Signed)
Patient is aware of ultrasound result.  Patient would like results from her urine culture. She states that she is feeling some better but not all the way.

## 2022-12-03 ENCOUNTER — Encounter: Payer: Self-pay | Admitting: Internal Medicine

## 2022-12-04 ENCOUNTER — Other Ambulatory Visit (INDEPENDENT_AMBULATORY_CARE_PROVIDER_SITE_OTHER): Payer: No Typology Code available for payment source

## 2022-12-04 DIAGNOSIS — N179 Acute kidney failure, unspecified: Secondary | ICD-10-CM

## 2022-12-04 LAB — BASIC METABOLIC PANEL
BUN: 26 mg/dL — ABNORMAL HIGH (ref 6–23)
CO2: 23 meq/L (ref 19–32)
Calcium: 9.5 mg/dL (ref 8.4–10.5)
Chloride: 104 meq/L (ref 96–112)
Creatinine, Ser: 1.07 mg/dL (ref 0.40–1.20)
GFR: 53.16 mL/min — ABNORMAL LOW (ref >60.00)
Glucose, Bld: 102 mg/dL — ABNORMAL HIGH (ref 70–99)
Potassium: 3.7 meq/L (ref 3.5–5.1)
Sodium: 137 meq/L (ref 135–145)

## 2022-12-15 ENCOUNTER — Encounter: Payer: Self-pay | Admitting: Internal Medicine

## 2022-12-30 ENCOUNTER — Encounter: Payer: Self-pay | Admitting: Obstetrics and Gynecology

## 2022-12-30 ENCOUNTER — Ambulatory Visit (INDEPENDENT_AMBULATORY_CARE_PROVIDER_SITE_OTHER): Payer: No Typology Code available for payment source | Admitting: Obstetrics and Gynecology

## 2022-12-30 VITALS — BP 124/80 | HR 79 | Ht 64.25 in | Wt 188.0 lb

## 2022-12-30 DIAGNOSIS — Z9189 Other specified personal risk factors, not elsewhere classified: Secondary | ICD-10-CM | POA: Diagnosis not present

## 2022-12-30 DIAGNOSIS — N951 Menopausal and female climacteric states: Secondary | ICD-10-CM

## 2022-12-30 DIAGNOSIS — Z01419 Encounter for gynecological examination (general) (routine) without abnormal findings: Secondary | ICD-10-CM

## 2022-12-30 DIAGNOSIS — E2839 Other primary ovarian failure: Secondary | ICD-10-CM

## 2022-12-30 MED ORDER — OYSTER SHELL CALCIUM/D3 500-5 MG-MCG PO TABS: 1.0000 | ORAL_TABLET | Freq: Two times a day (BID) | ORAL | 6 refills | Status: DC

## 2022-12-30 NOTE — Progress Notes (Signed)
67 y.o. y.o. female here for annual exam.   History:    [id number] female  Sexually active with longstanding partner  RP:  Established patient presenting for annual gynecologic exam   HPI:  Status post TAH for menorrhagia by Dr. Elana Alm. Postmenopausal well on no HRT.  No pelvic pain.  Abstinent.  Pap Neg in 2020.  Vitiligo in the vulvar region. Asymptomatic to the patient without symptoms of lichen sclerosis. Breasts normal. Screening mammo scheduled next week.  Urine/BMs normal.  Colono 2019.  BMI 33.63.  Health labs with Fam MD. No LMP recorded. Patient has had a hysterectomy.     Blood pressure 124/80, pulse 79, height 5' 4.25" (1.632 m), weight 188 lb (85.3 kg), SpO2 99%.     Component Value Date/Time   DIAGPAP  12/25/2020 1431    - Negative for intraepithelial lesion or malignancy (NILM)   ADEQPAP Satisfactory for evaluation. 12/25/2020 1431    GYN HISTORY:    Component Value Date/Time   DIAGPAP  12/25/2020 1431    - Negative for intraepithelial lesion or malignancy (NILM)   ADEQPAP Satisfactory for evaluation. 12/25/2020 1431    OB History  Gravida Para Term Preterm AB Living  5 2 2   3 2   SAB IAB Ectopic Multiple Live Births               # Outcome Date GA Lbr Len/2nd Weight Sex Type Anes PTL Lv  5 Term           4 Term           3 AB           2 AB           1 AB             Past Medical History:  Diagnosis Date   ANXIETY 06/04/2009   GERD 08/14/2008   occasional   Hx of adenomatous polyp of colon 03/11/2018   HYPERLIPIDEMIA 09/14/2006   HYPERTENSION 09/14/2006   Impaired glucose tolerance    MYOCARDIAL PERFUSION SCAN, WITH STRESS TEST, ABNORMAL 10/19/2008   PREMATURE VENTRICULAR CONTRACTIONS 08/14/2008    Past Surgical History:  Procedure Laterality Date   ABDOMINAL HYSTERECTOMY  1999   menorrhagia/partial   CESAREAN SECTION     2 times    Current Outpatient Medications on File Prior to Visit  Medication Sig Dispense Refill   amLODipine  (NORVASC) 5 MG tablet Take 1 tablet (5 mg total) by mouth daily. 90 tablet 1   BIOTIN PO Take by mouth.     Evolocumab (REPATHA SURECLICK) 140 MG/ML SOAJ INJECT 1 DOSE INTO THE SKIN EVERY 14 DAYS 2 mL 11   losartan-hydrochlorothiazide (HYZAAR) 100-25 MG tablet Take 1 tablet by mouth daily. 100 tablet 1   Multiple Vitamins-Minerals (ONE DAILY WOMENS 50 PLUS) TABS Take by mouth. Vitacost Women's 50+ multivitamin     naproxen sodium (ALEVE) 220 MG tablet Take 220 mg by mouth as needed.      Vitamin D, Ergocalciferol, (DRISDOL) 1.25 MG (50000 UNIT) CAPS capsule Take 1 capsule (50,000 Units total) by mouth every 7 (seven) days for 12 doses. 12 capsule 0   No current facility-administered medications on file prior to visit.    Social History   Socioeconomic History   Marital status: Widowed    Spouse name: Not on file   Number of children: 2   Years of education: Not on file   Highest education level: Not on file  Occupational History   Not on file  Tobacco Use   Smoking status: Former    Current packs/day: 0.00    Types: Cigarettes    Quit date: 03/24/1993    Years since quitting: 29.7   Smokeless tobacco: Never  Vaping Use   Vaping status: Never Used  Substance and Sexual Activity   Alcohol use: Not Currently   Drug use: No   Sexual activity: Not Currently    Partners: Male    Birth control/protection: Surgical, Abstinence    Comment: HYst-1st intercourse 67 yo-More than 5 partners, hysterectomy  Other Topics Concern   Not on file  Social History Narrative   Live with daughter   Right handed   Caffeine: some days non and sometimes 2 cups of coffee a week   Social Determinants of Health   Financial Resource Strain: Low Risk  (11/17/2022)   Overall Financial Resource Strain (CARDIA)    Difficulty of Paying Living Expenses: Not very hard  Food Insecurity: No Food Insecurity (11/17/2022)   Hunger Vital Sign    Worried About Running Out of Food in the Last Year: Never true     Ran Out of Food in the Last Year: Never true  Transportation Needs: No Transportation Needs (11/17/2022)   PRAPARE - Administrator, Civil Service (Medical): No    Lack of Transportation (Non-Medical): No  Physical Activity: Insufficiently Active (11/17/2022)   Exercise Vital Sign    Days of Exercise per Week: 3 days    Minutes of Exercise per Session: 20 min  Stress: Stress Concern Present (11/17/2022)   [employer] of Occupational Health - Occupational Stress Questionnaire    Feeling of Stress : To some extent  Social Connections: Moderately Integrated (11/17/2022)   Social Connection and Isolation Panel [NHANES]    Frequency of Communication with Friends and Family: More than three times a week    Frequency of Social Gatherings with Friends and Family: More than three times a week    Attends Religious Services: More than 4 times per year    Active Member of [employer] or Organizations: Yes    Attends Banker Meetings: 1 to 4 times per year    Marital Status: Widowed  Intimate Partner Violence: Not At Risk (11/17/2022)   Humiliation, Afraid, Rape, and Kick questionnaire    Fear of Current or Ex-Partner: No    Emotionally Abused: No    Physically Abused: No    Sexually Abused: No    Family History  Problem Relation Age of Onset   Arthritis Mother    Diabetes Mother    Heart disease Mother    Mental illness Father    Cancer Brother        Lung   Sleep apnea Neg Hx      Allergies  Allergen Reactions   Lisinopril     REACTION: cough   Risperidone     REACTION: unsure of dose; pt describes reaction of bad cough      Patient's last menstrual period was No LMP recorded. Patient has had a hysterectomy..            Review of Systems Alls systems reviewed and are negative.     Physical Exam Constitutional:      Appearance: Normal appearance.  Genitourinary:     Vulva normal.     No lesions in the vagina.     Right Labia: No rash, lesions or  skin changes.    Left  Labia: No lesions, skin changes or rash.    Vaginal cuff intact.    No vaginal discharge or tenderness.     No vaginal prolapse present.    No vaginal atrophy present.     Right Adnexa: not tender and no mass present.    Left Adnexa: not tender and no mass present.    Cervix is not absent.     Uterus is not absent. Breasts:    Right: Normal.     Left: Normal.  HENT:     Head: Normocephalic.  Neck:     Thyroid: No thyroid mass, thyromegaly or thyroid tenderness.  Cardiovascular:     Rate and Rhythm: Normal rate and regular rhythm.     Heart sounds: Normal heart sounds, S1 normal and S2 normal.  Pulmonary:     Effort: Pulmonary effort is normal.     Breath sounds: Normal breath sounds and air entry.  Abdominal:     General: There is no distension.     Palpations: Abdomen is soft. There is no mass.     Tenderness: There is no abdominal tenderness. There is no guarding or rebound.  Musculoskeletal:        General: Normal range of motion.     Cervical back: Full passive range of motion without pain, normal range of motion and neck supple. No tenderness.     Right lower leg: No edema.     Left lower leg: No edema.  Neurological:     Mental Status: She is alert.  Skin:    General: Skin is warm.  Psychiatric:        Mood and Affect: Mood normal.        Behavior: Behavior normal.        Thought Content: Thought content normal.  Vitals and nursing note reviewed. Exam conducted with a chaperone present.       A:         Well Woman GYN exam                             P:        Pap smear not indicated Encouraged annual mammogram screening Colon cancer screening up-to-date DXA ordered today Labs and immunizations to do with PMD Encouraged healthy lifestyle practices Encouraged Vit D and Calcium   No follow-ups on file.  Earley Favor

## 2023-01-21 ENCOUNTER — Encounter: Payer: Self-pay | Admitting: Family Medicine

## 2023-01-21 ENCOUNTER — Ambulatory Visit: Payer: No Typology Code available for payment source | Admitting: Family Medicine

## 2023-01-21 ENCOUNTER — Encounter: Payer: Self-pay | Admitting: Internal Medicine

## 2023-01-21 VITALS — BP 120/76 | HR 94 | Temp 98.6°F | Resp 16 | Ht 64.25 in | Wt 186.5 lb

## 2023-01-21 DIAGNOSIS — R059 Cough, unspecified: Secondary | ICD-10-CM | POA: Diagnosis not present

## 2023-01-21 DIAGNOSIS — J029 Acute pharyngitis, unspecified: Secondary | ICD-10-CM

## 2023-01-21 DIAGNOSIS — B950 Streptococcus, group A, as the cause of diseases classified elsewhere: Secondary | ICD-10-CM | POA: Diagnosis not present

## 2023-01-21 LAB — POC COVID19 BINAXNOW: SARS Coronavirus 2 Ag: NEGATIVE

## 2023-01-21 LAB — POCT RAPID STREP A (OFFICE): Rapid Strep A Screen: POSITIVE — AB

## 2023-01-21 MED ORDER — AMOXICILLIN 500 MG PO CAPS
500.0000 mg | ORAL_CAPSULE | Freq: Two times a day (BID) | ORAL | 0 refills | Status: AC
Start: 2023-01-21 — End: 2023-01-31

## 2023-01-21 MED ORDER — BENZONATATE 100 MG PO CAPS: 200.0000 mg | ORAL_CAPSULE | Freq: Two times a day (BID) | ORAL | 0 refills | Status: AC | PRN

## 2023-01-21 NOTE — Progress Notes (Signed)
ACUTE VISIT Chief Complaint  Patient presents with   Cough   Sore Throat        HPI: [name] is a 67 y.o. female with a PMHx significant for HTN, OSA, GERD, acute renal failure, and vitamin D deficiency, among others, who is here today complaining of 3 days of respiratory symptoms. She tested positive for strep in the office today, she was screened before visit as part of protocol but she denies having any sore throat. She complains she is having yellow productive cough. She says it is worse in the morning.   Cough This is a new problem. The current episode started in the past 7 days. The problem has been unchanged. The cough is Productive of sputum. Associated symptoms include postnasal drip. Pertinent negatives include no chest pain, chills, ear congestion, ear pain, fever, headaches, heartburn, hemoptysis, myalgias, nasal congestion, rash, sore throat, weight loss or wheezing. Nothing aggravates the symptoms. She has tried OTC cough suppressant for the symptoms. The treatment provided mild relief.   She endorses some associated fatigue, postnasal drainage, and minimal SOB while resting. The latter one she wonders if it is "in my mind."  She mentions she has a toddler in daycare who often comes home with a cough but no fever. She has been taking OTC Nighttime Cold and Flu for her symptoms.  Pertinent negatives include fever, chills, earache, abdominal pain, nausea, vomiting,acid reflux. Hx of GERD on nonpharmacologic treatment.  Review of Systems  Constitutional:  Negative for chills, fever and weight loss.  HENT:  Positive for postnasal drip. Negative for ear pain and sore throat.   Respiratory:  Positive for cough. Negative for hemoptysis and wheezing.   Cardiovascular:  Negative for chest pain.  Gastrointestinal:  Negative for heartburn.       No changes in bowel movements.  Musculoskeletal:  Negative for myalgias.  Skin:  Negative for rash.  Neurological:   Negative for weakness and headaches.  See other pertinent positives and negatives in HPI.  Current Outpatient Medications on File Prior to Visit  Medication Sig Dispense Refill   amLODipine (NORVASC) 5 MG tablet Take 1 tablet (5 mg total) by mouth daily. 90 tablet 1   BIOTIN PO Take by mouth.     calcium-vitamin D (OSCAL WITH D) 500-5 MG-MCG tablet Take 1 tablet by mouth 2 (two) times daily. 60 tablet 6   Evolocumab (REPATHA SURECLICK) 140 MG/ML SOAJ INJECT 1 DOSE INTO THE SKIN EVERY 14 DAYS 2 mL 11   losartan-hydrochlorothiazide (HYZAAR) 100-25 MG tablet Take 1 tablet by mouth daily. 100 tablet 1   Multiple Vitamins-Minerals (ONE DAILY WOMENS 50 PLUS) TABS Take by mouth. Vitacost Women's 50+ multivitamin     naproxen sodium (ALEVE) 220 MG tablet Take 220 mg by mouth as needed.      Vitamin D, Ergocalciferol, (DRISDOL) 1.25 MG (50000 UNIT) CAPS capsule Take 1 capsule (50,000 Units total) by mouth every 7 (seven) days for 12 doses. 12 capsule 0   No current facility-administered medications on file prior to visit.   Past Medical History:  Diagnosis Date   ANXIETY 06/04/2009   GERD 08/14/2008   occasional   Hx of adenomatous polyp of colon 03/11/2018   HYPERLIPIDEMIA 09/14/2006   HYPERTENSION 09/14/2006   Impaired glucose tolerance    MYOCARDIAL PERFUSION SCAN, WITH STRESS TEST, ABNORMAL 10/19/2008   PREMATURE VENTRICULAR CONTRACTIONS 08/14/2008   Allergies  Allergen Reactions   Lisinopril     REACTION: cough   Risperidone  REACTION: unsure of dose; pt describes reaction of bad cough   Social History   Socioeconomic History   Marital status: Widowed    Spouse name: Not on file   Number of children: 2   Years of education: Not on file   Highest education level: Not on file  Occupational History   Not on file  Tobacco Use   Smoking status: Former    Current packs/day: 0.00    Types: Cigarettes    Quit date: 03/24/1993    Years since quitting: 29.8   Smokeless tobacco:  Never  Vaping Use   Vaping status: Never Used  Substance and Sexual Activity   Alcohol use: Not Currently   Drug use: No   Sexual activity: Not Currently    Partners: Male    Birth control/protection: Surgical, Abstinence    Comment: HYst-1st intercourse 67 yo-More than 5 partners, hysterectomy  Other Topics Concern   Not on file  Social History Narrative   Live with daughter   Right handed   Caffeine: some days non and sometimes 2 cups of coffee a week   Social Determinants of Health   Financial Resource Strain: Low Risk  (11/17/2022)   Overall Financial Resource Strain (CARDIA)    Difficulty of Paying Living Expenses: Not very hard  Food Insecurity: No Food Insecurity (11/17/2022)   Hunger Vital Sign    Worried About Running Out of Food in the Last Year: Never true    Ran Out of Food in the Last Year: Never true  Transportation Needs: No Transportation Needs (11/17/2022)   PRAPARE - Administrator, Civil Service (Medical): No    Lack of Transportation (Non-Medical): No  Physical Activity: Insufficiently Active (11/17/2022)   Exercise Vital Sign    Days of Exercise per Week: 3 days    Minutes of Exercise per Session: 20 min  Stress: Stress Concern Present (11/17/2022)   [employer] of Occupational Health - Occupational Stress Questionnaire    Feeling of Stress : To some extent  Social Connections: Moderately Integrated (11/17/2022)   Social Connection and Isolation Panel [NHANES]    Frequency of Communication with Friends and Family: More than three times a week    Frequency of Social Gatherings with Friends and Family: More than three times a week    Attends Religious Services: More than 4 times per year    Active Member of [employer] or Organizations: Yes    Attends Banker Meetings: 1 to 4 times per year    Marital Status: Widowed   Vitals:   01/21/23 1440  BP: 120/76  Pulse: 94  Resp: 16  Temp: 98.6 F (37 C)  SpO2: 100%   Body mass index  is 31.76 kg/m.  Physical Exam Vitals and nursing note reviewed.  Constitutional:      General: She is not in acute distress.    Appearance: She is well-developed. She is not ill-appearing.  HENT:     Head: Normocephalic and atraumatic.     Right Ear: Tympanic membrane, ear canal and external ear normal.     Left Ear: Tympanic membrane, ear canal and external ear normal.     Nose: Rhinorrhea present.     Mouth/Throat:     Mouth: Mucous membranes are moist.     Pharynx: Oropharynx is clear. Uvula midline. No oropharyngeal exudate or posterior oropharyngeal erythema.  Eyes:     Conjunctiva/sclera: Conjunctivae normal.  Cardiovascular:     Rate and Rhythm: Normal  rate and regular rhythm.     Heart sounds: No murmur heard. Pulmonary:     Effort: Pulmonary effort is normal. No respiratory distress.     Breath sounds: Normal breath sounds. No stridor.  Musculoskeletal:     Cervical back: No edema or erythema.  Lymphadenopathy:     Head:     Right side of head: No submandibular adenopathy.     Left side of head: No submandibular adenopathy.     Cervical: Cervical adenopathy (< 1 cm, not tender.) present.     Right cervical: Superficial cervical adenopathy present.     Left cervical: Superficial cervical adenopathy present.  Skin:    General: Skin is warm.     Findings: No erythema or rash.  Neurological:     Mental Status: She is alert and oriented to person, place, and time.  Psychiatric:        Mood and Affect: Mood and affect normal.   ASSESSMENT AND PLAN:  Ms. Fontanez was seen today for cough and sore throat.   Cough, unspecified type We discussed possible etiologies, most likely related to a viral URI. COVID-19 test was negative. Recommend symptomatic treatment. Adequate hydration. Monitor for new symptoms, including fever. I do not think imaging is needed at this time. Benzonatate recommended for symptomatic treatment. Plain Mucinex may also help.  -      POC COVID-19 BinaxNow -     Benzonatate; Take 2 capsules (200 mg total) by mouth 2 (two) times daily as needed for up to 10 days.  Dispense: 30 capsule; Refill: 0  Sore throat -     POC COVID-19 BinaxNow -     POCT rapid strep A  Streptococcal infection group A Denies any sore throat. Rapid strep test in the office was positive, so treatment with amoxicillin 500 mg twice daily x 10 days recommended. Monitor for new symptoms. We discussed some side effects of antibiotic treatment.  -     Amoxicillin; Take 1 capsule (500 mg total) by mouth 2 (two) times daily for 10 days.  Dispense: 20 capsule; Refill: 0  Return if symptoms worsen or fail to improve.  I, Rolla Etienne Wierda, acting as a scribe for Anselma Herbel [place], MD., have documented all relevant documentation on the behalf of Vanessa Sookdeo [place], MD, as directed by  Vanessa Carbonell [place], MD while in the presence of Leanthony Rhett [place], MD.   I, Yudit Modesitt [place], MD, have reviewed all documentation for this visit. The documentation on 01/21/23 for the exam, diagnosis, procedures, and orders are all accurate and complete.  Caralina Nop G. [place], MD  [facility]. [place] office.

## 2023-01-21 NOTE — Patient Instructions (Addendum)
A few things to remember from today's visit:  Cough, unspecified type - Plan: POC COVID-19, benzonatate (TESSALON) 100 MG capsule  Sore throat - Plan: POC COVID-19, POC Rapid Strep A  Positive testing for group B Streptococcus - Plan: amoxicillin (AMOXIL) 500 MG capsule Plain mucinex and adequate hydration. Monitor for fever. Complete 10 days of antibiotic.  If you need refills for medications you take chronically, please call your pharmacy. Do not use My Chart to request refills or for acute issues that need immediate attention. If you send a my chart message, it may take a few days to be addressed, specially if I am not in the office.  Please be sure medication list is accurate. If a new problem present, please set up appointment sooner than planned today.

## 2023-01-29 ENCOUNTER — Telehealth: Payer: Self-pay | Admitting: Internal Medicine

## 2023-01-29 DIAGNOSIS — I10 Essential (primary) hypertension: Secondary | ICD-10-CM

## 2023-01-29 MED ORDER — LOSARTAN POTASSIUM-HCTZ 100-25 MG PO TABS: 1.0000 | ORAL_TABLET | Freq: Every day | ORAL | 1 refills | Status: DC

## 2023-01-29 MED ORDER — AMLODIPINE BESYLATE 5 MG PO TABS: 5.0000 mg | ORAL_TABLET | Freq: Every day | ORAL | 1 refills | Status: DC

## 2023-01-29 NOTE — Telephone Encounter (Signed)
Refill sent.

## 2023-01-29 NOTE — Addendum Note (Signed)
Addended by: Kern Reap B on: 01/29/2023 02:58 PM   Modules accepted: Orders

## 2023-01-29 NOTE — Telephone Encounter (Signed)
Med refill request losartan-hydrochlorothiazide (HYZAAR) 100-25 MG tablet  amLODipine (NORVASC) 5 MG tablet  [facility] - [place], [place] - [address] [facility] Portal to [facility] Phone: [phone]  Fax: [phone]

## 2023-02-03 ENCOUNTER — Other Ambulatory Visit: Payer: Self-pay | Admitting: Pharmacist

## 2023-02-03 MED ORDER — REPATHA SURECLICK 140 MG/ML ~~LOC~~ SOAJ: SUBCUTANEOUS | 11 refills | Status: DC

## 2023-02-26 ENCOUNTER — Telehealth: Payer: Self-pay | Admitting: Internal Medicine

## 2023-02-26 DIAGNOSIS — E785 Hyperlipidemia, unspecified: Secondary | ICD-10-CM

## 2023-02-26 NOTE — Telephone Encounter (Signed)
  Per MyChart scheduling message:  Patient is requesting Blood work to determine if Repatha is working to lower bad cholesterol

## 2023-02-26 NOTE — Telephone Encounter (Addendum)
Spoke with pt regarding visit with Reather Littler, NP for follow up Lipid clinic visit. Pt was not aware that [facility] Garibaldi is not part advance lipid clinic at this time. Re-scheduled pt to see Dr. Rennis Golden back in clinic. Pt will obtain fasting lipid panel prior to visit. Lab orders placed. Pt does state that she has been on Repatha consistently with no breaks recently. Pt verbalizes understanding.

## 2023-02-26 NOTE — Addendum Note (Signed)
Addended by: Bernita Buffy on: 02/26/2023 05:22 PM   Modules accepted: Orders

## 2023-02-26 NOTE — Telephone Encounter (Signed)
Patient identification verified by 2 forms. Vanessa Rail, RN    Called and spoke to patient  Patient states:   -would like to know if Repatha Rx working properly  -has not been able to schedule appointment with Dr. Rennis Golden  Informed patient best to have annual follow up Patient scheduled for OV 12/6 at 3pm with NP west  Patient agrees with plan, no questions at this time

## 2023-02-27 ENCOUNTER — Ambulatory Visit: Payer: No Typology Code available for payment source | Admitting: Cardiology

## 2023-02-27 DIAGNOSIS — G4733 Obstructive sleep apnea (adult) (pediatric): Secondary | ICD-10-CM | POA: Diagnosis not present

## 2023-02-27 DIAGNOSIS — E785 Hyperlipidemia, unspecified: Secondary | ICD-10-CM | POA: Diagnosis not present

## 2023-02-28 LAB — NMR, LIPOPROFILE
Cholesterol, Total: 183 mg/dL (ref 100–199)
HDL Particle Number: 40.6 umol/L (ref >=30.5)
HDL-C: 44 mg/dL (ref >39)
LDL Particle Number: 982 nmol/L (ref <1000)
LDL Size: 19.8 nmol — ABNORMAL LOW (ref >20.5)
LDL-C (NIH Calc): 92 mg/dL (ref 0–99)
LP-IR Score: 60 — ABNORMAL HIGH (ref <=45)
Small LDL Particle Number: 769 nmol/L — ABNORMAL HIGH (ref <=527)
Triglycerides: 279 mg/dL — ABNORMAL HIGH (ref 0–149)

## 2023-03-04 DIAGNOSIS — G4733 Obstructive sleep apnea (adult) (pediatric): Secondary | ICD-10-CM | POA: Diagnosis not present

## 2023-03-12 ENCOUNTER — Ambulatory Visit: Payer: No Typology Code available for payment source | Attending: Internal Medicine | Admitting: Internal Medicine

## 2023-03-12 VITALS — BP 136/74 | HR 86 | Ht 64.0 in | Wt 191.0 lb

## 2023-03-12 DIAGNOSIS — T466X5A Adverse effect of antihyperlipidemic and antiarteriosclerotic drugs, initial encounter: Secondary | ICD-10-CM

## 2023-03-12 DIAGNOSIS — M791 Myalgia, unspecified site: Secondary | ICD-10-CM | POA: Diagnosis not present

## 2023-03-12 DIAGNOSIS — E785 Hyperlipidemia, unspecified: Secondary | ICD-10-CM | POA: Diagnosis not present

## 2023-03-12 DIAGNOSIS — E7841 Elevated Lipoprotein(a): Secondary | ICD-10-CM | POA: Diagnosis not present

## 2023-03-12 DIAGNOSIS — T466X5D Adverse effect of antihyperlipidemic and antiarteriosclerotic drugs, subsequent encounter: Secondary | ICD-10-CM | POA: Diagnosis not present

## 2023-03-12 NOTE — Patient Instructions (Signed)
Medication Instructions:  No changes  *If you need a refill on your cardiac medications before your next appointment, please call your pharmacy*   Lab Work: NMR in 6 months  If you have labs (blood work) drawn today and your tests are completely normal, you will receive your results only by: MyChart Message (if you have MyChart) OR A paper copy in the mail If you have any lab test that is abnormal or we need to change your treatment, we will call you to review the results.   Testing/Procedures: None    Follow-Up: At [facility], you and your health needs are our priority.  As part of our continuing mission to provide you with exceptional heart care, we have created designated Provider Care Teams.  These Care Teams include your primary Cardiologist (physician) and Advanced Practice Providers (APPs -  Physician Assistants and Nurse Practitioners) who all work together to provide you with the care you need, when you need it.    Your next appointment:   6 month(s)  Provider:   Rennis Golden

## 2023-03-12 NOTE — Progress Notes (Signed)
LIPID CLINIC CONSULT NOTE  Chief Complaint:  Follow-up dyslipidemia  Primary Care Physician: Philip Aspen, Limmie Patricia, MD  Primary Cardiologist:  None  HPI:  Vanessa Price is a 67 y.o. female who is being seen today for the evaluation of dyslipidemia at the request of Philip Aspen, Vanessa Bar*.  This is a pleasant 67 year old female kindly referred by Dr. Ardyth Harps for evaluation and management of dyslipidemia, specifically elevated triglycerides.  Most recently lipid profile showed total cholesterol of 249, triglycerides 293 (as high as 774 in the past), HDL 39, and LDL of 174.  Unfortunately, she has been intolerant to statins, having failed multiple statins in the past over 20+ years of therapy.  She is currently on fenofibrate which has resulted in marked improvement in her triglycerides however again her LDL cholesterol remains quite high.  There is no known coronary disease.  There is a family history of heart disease.  10/12/2019  Ms. Vanessa Price returns today for follow-up.  She had coronary calcium scoring which was 0 although has other risk factors including hypertension, obesity, prediabetes and at least intermediate cardiovascular risk factors.  I recommended a target LDL less than 100.  She was started on bempedoic acid (Nexletol) due to the fact that she did not tolerate multiple statins due to myalgia as well as ezetimibe.  She has had some reduction in her cholesterol however not significant.  Total cholesterol is now 2 and 37, triglycerides 279, HDL 32 and LDL 153 (down from 177).  Since she is not able to reach target, I feel that we will need additional therapy.  She is also on fenofibrate 145 mg daily.  08/01/2020  Ms. Vanessa Price is seen today in follow-up.  Her cholesterol has come down substantially with the addition of Repatha.  Direct LDL is now 41, total cholesterol 111, triglycerides remain elevated at 320 and HDL low at 38.  She reports that the  bempedoic acid is quite expensive.  02/04/2021  Ms. Vanessa Price returns today for follow-up.  Her cholesterol has come down nicely on combination of Repatha and Lovaza.  She is no longer taking fenofibrate.  Total cholesterol 147, HDL 44, triglycerides 162 and LDL 75.  Overall she seems to be tolerating the medicine well.  She was concerned about recently having some hair loss.  This appears to be more in a female pattern hair loss and I told her that its not a side effect of Repatha.  02/07/2022  Ms. Vanessa Price is seen today in follow-up.  Overall she is doing well.  She had repeat lipids drawn.  Her total cholesterol is now 152, triglycerides are slightly higher at 181, HDL 40 and LDL 75.  Overall still pretty good control.  She reports being a little less active than previous which may account for this.  We discussed checking LP(a) today which has not yet been assessed.  Fortunately she had no coronary calcium.  03/12/2023  Ms. Vanessa Price is seen today in follow-up.  She has continued to do well on Repatha now for several years.  Her lipid NMR shows an LDL particle #982, LDL 92, HDL 44 and triglycerides 279.  Her triglycerides have been normal previously however she notes that she may be off of her diet somewhat and not exercising as much.  She did have a mildly elevated LP(a) of 87 nmol/L, this was on therapy with Repatha.  She remains asymptomatic from a cardiac standpoint.  PMHx:  Past Medical History:  Diagnosis Date   ANXIETY 06/04/2009  GERD 08/14/2008   occasional   Hx of adenomatous polyp of colon 03/11/2018   HYPERLIPIDEMIA 09/14/2006   HYPERTENSION 09/14/2006   Impaired glucose tolerance    MYOCARDIAL PERFUSION SCAN, WITH STRESS TEST, ABNORMAL 10/19/2008   PREMATURE VENTRICULAR CONTRACTIONS 08/14/2008    Past Surgical History:  Procedure Laterality Date   ABDOMINAL HYSTERECTOMY  1999   menorrhagia/partial   CESAREAN SECTION     2 times    FAMHx:  Family  History  Problem Relation Age of Onset   Arthritis Mother    Diabetes Mother    Heart disease Mother    Mental illness Father    Cancer Brother        Lung   Sleep apnea Neg Hx     SOCHx:   reports that she quit smoking about 29 years ago. Her smoking use included cigarettes. She has never used smokeless tobacco. She reports that she does not currently use alcohol. She reports that she does not use drugs.  ALLERGIES:  Allergies  Allergen Reactions   Lisinopril     REACTION: cough   Risperidone     REACTION: unsure of dose; pt describes reaction of bad cough    ROS: Pertinent items noted in HPI and remainder of comprehensive ROS otherwise negative.  HOME MEDS: Current Outpatient Medications on File Prior to Visit  Medication Sig Dispense Refill   amLODipine (NORVASC) 5 MG tablet Take 1 tablet (5 mg total) by mouth daily. 90 tablet 1   BIOTIN PO Take by mouth.     calcium-vitamin D (OSCAL WITH D) 500-5 MG-MCG tablet Take 1 tablet by mouth 2 (two) times daily. 60 tablet 6   Evolocumab (REPATHA SURECLICK) 140 MG/ML SOAJ INJECT 1 DOSE INTO THE SKIN EVERY 14 DAYS 2 mL 11   losartan-hydrochlorothiazide (HYZAAR) 100-25 MG tablet Take 1 tablet by mouth daily. 100 tablet 1   Multiple Vitamins-Minerals (ONE DAILY WOMENS 50 PLUS) TABS Take by mouth. Vitacost Women's 50+ multivitamin     naproxen sodium (ALEVE) 220 MG tablet Take 220 mg by mouth as needed.      No current facility-administered medications on file prior to visit.    LABS/IMAGING: No results found for this or any previous visit (from the past 48 hours). No results found.  LIPID PANEL:    Component Value Date/Time   CHOL 147 11/17/2022 0743   CHOL 147 01/28/2021 0947   TRIG 135.0 11/17/2022 0743   TRIG 142 03/03/2006 0906   HDL 30.50 (L) 11/17/2022 0743   HDL 44 01/28/2021 0947   CHOLHDL 5 11/17/2022 0743   VLDL 27.0 11/17/2022 0743   LDLCALC 90 11/17/2022 0743   LDLCALC 75 01/28/2021 0947   LDLDIRECT 41.0  05/31/2020 0854    WEIGHTS: Wt Readings from Last 3 Encounters:  03/12/23 191 lb (86.6 kg)  01/21/23 186 lb 8 oz (84.6 kg)  12/30/22 188 lb (85.3 kg)    VITALS: BP 136/74 (BP Location: Left Arm, Patient Position: Sitting, Cuff Size: Large)   Pulse 86   Ht 5\' 4"  (1.626 m)   Wt 191 lb (86.6 kg)   SpO2 95%   BMI 32.79 kg/m   EXAM: Deferred  EKG: Deferred  ASSESSMENT: Mixed dyslipidemia, possible familial combined hyperlipidemia [facility]) Intermediate 10 year risk (goal LDL <100) Hypertension Statin and zetia intolerance-myalgias CAC score (03/2019) Hepatic steatosis Elevated LP(a)-87 nmol/L  PLAN: 1.   Ms. Vanessa Price continues to achieve her target LDL less than 100.  She had no coronary calcium about  3 years ago.  She had a mildly elevated LP(a) on therapy.  Triglycerides recently were elevated.  She says that her diet and activity level particularly have not been as good as they had been in the past.  She intends to work on that.  I did provide her with some dietary information sheets.  Follow-up with me in 6 months or sooner as necessary.  Chrystie Nose, MD, [facility], FACP  [place]  [facility]  Medical Director of the Advanced Lipid Disorders &  Cardiovascular Risk Reduction Clinic Diplomate of the American Board of Clinical Lipidology Attending Cardiologist  Direct Dial: [phone]  Fax: [phone]  Website:  [email or web address] 03/12/2023, 9:39 AM

## 2023-03-26 ENCOUNTER — Other Ambulatory Visit (HOSPITAL_COMMUNITY): Payer: Self-pay

## 2023-03-26 ENCOUNTER — Encounter: Payer: Self-pay | Admitting: Internal Medicine

## 2023-03-26 ENCOUNTER — Telehealth: Payer: Self-pay | Admitting: Pharmacy Technician

## 2023-03-26 NOTE — Telephone Encounter (Signed)
 Pharmacy Patient Advocate Encounter   Received notification from Fax that prior authorization for repatha  is required/requested.   Insurance verification completed.   The patient is insured through [employer] .   Per test claim: PA required; PA submitted to above mentioned insurance via Fax Key/confirmation #/EOC fax Status is pending

## 2023-03-26 NOTE — Telephone Encounter (Signed)
 Update sent to patient via MyChart

## 2023-03-26 NOTE — Telephone Encounter (Signed)
 Pharmacy Patient Advocate Encounter  Received notification from [employer] that Prior Authorization for repatha  has been APPROVED from 03/25/23 to 03/23/24. Ran test claim, Copay is $297.00 one month. This test claim was processed through [facility]- copay amounts may vary at other pharmacies due to pharmacy/plan contracts, or as the patient moves through the different stages of their insurance plan.   PA #/Case ID/Reference #: [id number]

## 2023-04-09 ENCOUNTER — Ambulatory Visit: Payer: Medicare HMO | Admitting: Dermatology

## 2023-04-20 ENCOUNTER — Ambulatory Visit: Payer: Self-pay | Admitting: Internal Medicine

## 2023-04-20 ENCOUNTER — Ambulatory Visit (INDEPENDENT_AMBULATORY_CARE_PROVIDER_SITE_OTHER): Payer: Medicare HMO | Admitting: Internal Medicine

## 2023-04-20 ENCOUNTER — Encounter: Payer: Self-pay | Admitting: Internal Medicine

## 2023-04-20 VITALS — BP 130/80 | HR 84 | Temp 98.4°F | Wt 190.6 lb

## 2023-04-20 DIAGNOSIS — E559 Vitamin D deficiency, unspecified: Secondary | ICD-10-CM

## 2023-04-20 DIAGNOSIS — R058 Other specified cough: Secondary | ICD-10-CM

## 2023-04-20 DIAGNOSIS — D509 Iron deficiency anemia, unspecified: Secondary | ICD-10-CM

## 2023-04-20 DIAGNOSIS — I10 Essential (primary) hypertension: Secondary | ICD-10-CM

## 2023-04-20 LAB — IBC + FERRITIN
Ferritin: 84.3 ng/mL (ref 10.0–291.0)
Iron: 86 ug/dL (ref 42–145)
Saturation Ratios: 24.3 % (ref 20.0–50.0)
TIBC: 354.2 ug/dL (ref 250.0–450.0)
Transferrin: 253 mg/dL (ref 212.0–360.0)

## 2023-04-20 LAB — CBC WITH DIFFERENTIAL/PLATELET
Basophils Absolute: 0 10*3/uL (ref 0.0–0.1)
Basophils Relative: 1 % (ref 0.0–3.0)
Eosinophils Absolute: 0.1 10*3/uL (ref 0.0–0.7)
Eosinophils Relative: 2.8 % (ref 0.0–5.0)
HCT: 41.3 % (ref 36.0–46.0)
Hemoglobin: 13.6 g/dL (ref 12.0–15.0)
Lymphocytes Relative: 43.5 % (ref 12.0–46.0)
Lymphs Abs: 1.7 10*3/uL (ref 0.7–4.0)
MCHC: 33 g/dL (ref 30.0–36.0)
MCV: 84.6 fL (ref 78.0–100.0)
Monocytes Absolute: 0.3 10*3/uL (ref 0.1–1.0)
Monocytes Relative: 8.2 % (ref 3.0–12.0)
Neutro Abs: 1.8 10*3/uL (ref 1.4–7.7)
Neutrophils Relative %: 44.5 % (ref 43.0–77.0)
Platelets: 226 10*3/uL (ref 150.0–400.0)
RBC: 4.89 Mil/uL (ref 3.87–5.11)
RDW: 14.8 % (ref 11.5–15.5)
WBC: 4 10*3/uL (ref 4.0–10.5)

## 2023-04-20 LAB — VITAMIN D 25 HYDROXY (VIT D DEFICIENCY, FRACTURES): VITD: 30.22 ng/mL (ref 30.00–100.00)

## 2023-04-20 MED ORDER — BENZONATATE 100 MG PO CAPS: 100.0000 mg | ORAL_CAPSULE | Freq: Two times a day (BID) | ORAL | 0 refills | Status: DC | PRN

## 2023-04-20 MED ORDER — LOSARTAN POTASSIUM-HCTZ 100-25 MG PO TABS: 1.0000 | ORAL_TABLET | Freq: Every day | ORAL | 1 refills | Status: DC

## 2023-04-20 MED ORDER — AMLODIPINE BESYLATE 5 MG PO TABS: 5.0000 mg | ORAL_TABLET | Freq: Every day | ORAL | 1 refills | Status: DC

## 2023-04-20 NOTE — Telephone Encounter (Signed)
Copied from CRM [id number]. Topic: Clinical - Prescription Issue >> Apr 20, 2023  2:17 PM Prudencio Pair wrote: Reason for CRM: Patient states she saw Dr. Ardyth Harps today & talked to her about cough syrup. She states she gets to the pharmacy & she has pills & stated that is NOT what her & Dr. Ardyth Harps discussed. She states it's suppose to be for liquid that she can take any time of the day. Patient is requesting a call back from the nurse. CB #: [phone].

## 2023-04-20 NOTE — Progress Notes (Signed)
Established Patient Office Visit     CC/Reason for Visit: Cough  HPI: Vanessa Price is a 68 y.o. female who is coming in today for the above mentioned reasons. Past Medical History is significant for: Vitamin D deficiency, iron deficiency anemia hypertension among other issues.  She had a URI around beginning of January.  Ever since has had a slightly productive cough that is productive of white/yellow sputum.  It is worse at nighttime.  No fever.  Some nasal congestion and runny nose.   Past Medical/Surgical History: Past Medical History:  Diagnosis Date   ANXIETY 06/04/2009   GERD 08/14/2008   occasional   Hx of adenomatous polyp of colon 03/11/2018   HYPERLIPIDEMIA 09/14/2006   HYPERTENSION 09/14/2006   Impaired glucose tolerance    MYOCARDIAL PERFUSION SCAN, WITH STRESS TEST, ABNORMAL 10/19/2008   PREMATURE VENTRICULAR CONTRACTIONS 08/14/2008    Past Surgical History:  Procedure Laterality Date   ABDOMINAL HYSTERECTOMY  1999   menorrhagia/partial   CESAREAN SECTION     2 times    Social History:  reports that she quit smoking about 30 years ago. Her smoking use included cigarettes. She has never used smokeless tobacco. She reports that she does not currently use alcohol. She reports that she does not use drugs.  Allergies: Allergies  Allergen Reactions   Lisinopril     REACTION: cough   Risperidone     REACTION: unsure of dose; pt describes reaction of bad cough    Family History:  Family History  Problem Relation Age of Onset   Arthritis Mother    Diabetes Mother    Heart disease Mother    Mental illness Father    Cancer Brother        Lung   Sleep apnea Neg Hx      Current Outpatient Medications:    benzonatate (TESSALON) 100 MG capsule, Take 1 capsule (100 mg total) by mouth 2 (two) times daily as needed for cough., Disp: 20 capsule, Rfl: 0   BIOTIN PO, Take by mouth., Disp: , Rfl:    Evolocumab (REPATHA SURECLICK) 140 MG/ML SOAJ,  INJECT 1 DOSE INTO THE SKIN EVERY 14 DAYS, Disp: 2 mL, Rfl: 11   Multiple Vitamin (MULTIVITAMIN) tablet, Take 1 tablet by mouth daily. Rituals, Disp: , Rfl:    naproxen sodium (ALEVE) 220 MG tablet, Take 220 mg by mouth as needed. , Disp: , Rfl:    amLODipine (NORVASC) 5 MG tablet, Take 1 tablet (5 mg total) by mouth daily., Disp: 90 tablet, Rfl: 1   losartan-hydrochlorothiazide (HYZAAR) 100-25 MG tablet, Take 1 tablet by mouth daily., Disp: 100 tablet, Rfl: 1  Review of Systems:  Negative unless indicated in HPI.   Physical Exam: Vitals:   04/20/23 0828  BP: 130/80  Pulse: 84  Temp: 98.4 F (36.9 C)  TempSrc: Oral  SpO2: 99%  Weight: 190 lb 9.6 oz (86.5 kg)    Body mass index is 32.72 kg/m.   Physical Exam Vitals reviewed.  Constitutional:      Appearance: Normal appearance.  HENT:     Right Ear: Tympanic membrane, ear canal and external ear normal.     Left Ear: Tympanic membrane, ear canal and external ear normal.     Mouth/Throat:     Mouth: Mucous membranes are moist.     Pharynx: Posterior oropharyngeal erythema present.  Eyes:     Conjunctiva/sclera: Conjunctivae normal.     Pupils: Pupils are equal, round, and reactive to  light.  Cardiovascular:     Rate and Rhythm: Normal rate and regular rhythm.  Pulmonary:     Effort: Pulmonary effort is normal.     Breath sounds: Normal breath sounds.  Neurological:     Mental Status: She is alert.      Impression and Plan:  Post-viral cough syndrome -     Benzonatate; Take 1 capsule (100 mg total) by mouth 2 (two) times daily as needed for cough.  Dispense: 20 capsule; Refill: 0  Essential hypertension -     amLODIPine Besylate; Take 1 tablet (5 mg total) by mouth daily.  Dispense: 90 tablet; Refill: 1 -     Losartan Potassium-HCTZ; Take 1 tablet by mouth daily.  Dispense: 100 tablet; Refill: 1  Vitamin D deficiency -     VITAMIN D 25 Hydroxy (Vit-D Deficiency, Fractures); Future  Microcytic anemia -     CBC  with Differential/Platelet; Future -     IBC + Ferritin; Future   -Given exam findings, PNA, pharyngitis, ear infection are not likely, hence abx have not been prescribed. -Have advised rest, fluids, OTC antihistamines, cough suppressants and mucinex. -RTC if no improvement in 10-14 days. -Suspect prolonged postviral cough that can last anywhere from 3 to 12 weeks.  Tessalon Perles given. -Check vitamin D as well as hemoglobin and iron studies for follow-up.   Time spent:31 minutes reviewing chart, interviewing and examining patient and formulating plan of care.     Chaya Jan, MD [facility]

## 2023-04-21 NOTE — Telephone Encounter (Signed)
Patient is aware

## 2023-04-21 NOTE — Telephone Encounter (Signed)
Patient is aware.  Patient would like to know what she can take for her drainage?

## 2023-04-22 ENCOUNTER — Encounter: Payer: Self-pay | Admitting: Internal Medicine

## 2023-04-30 ENCOUNTER — Encounter: Payer: Self-pay | Admitting: Internal Medicine

## 2023-04-30 DIAGNOSIS — L6681 Central centrifugal cicatricial alopecia: Secondary | ICD-10-CM | POA: Diagnosis not present

## 2023-04-30 DIAGNOSIS — L658 Other specified nonscarring hair loss: Secondary | ICD-10-CM | POA: Diagnosis not present

## 2023-05-01 ENCOUNTER — Encounter: Payer: Self-pay | Admitting: Internal Medicine

## 2023-05-20 ENCOUNTER — Other Ambulatory Visit: Payer: Self-pay | Admitting: *Deleted

## 2023-05-20 DIAGNOSIS — I10 Essential (primary) hypertension: Secondary | ICD-10-CM

## 2023-05-20 MED ORDER — AMLODIPINE BESYLATE 5 MG PO TABS: 5.0000 mg | ORAL_TABLET | Freq: Every day | ORAL | 1 refills | Status: DC

## 2023-06-04 ENCOUNTER — Ambulatory Visit: Payer: Medicare HMO | Admitting: Adult Health

## 2023-07-03 ENCOUNTER — Other Ambulatory Visit: Payer: Self-pay | Admitting: Internal Medicine

## 2023-07-03 DIAGNOSIS — T466X5A Adverse effect of antihyperlipidemic and antiarteriosclerotic drugs, initial encounter: Secondary | ICD-10-CM

## 2023-07-03 DIAGNOSIS — E785 Hyperlipidemia, unspecified: Secondary | ICD-10-CM

## 2023-07-03 DIAGNOSIS — E7841 Elevated Lipoprotein(a): Secondary | ICD-10-CM

## 2023-08-25 ENCOUNTER — Ambulatory Visit (INDEPENDENT_AMBULATORY_CARE_PROVIDER_SITE_OTHER): Admitting: Family Medicine

## 2023-08-25 ENCOUNTER — Ambulatory Visit: Payer: Self-pay

## 2023-08-25 ENCOUNTER — Encounter: Payer: Self-pay | Admitting: Family Medicine

## 2023-08-25 VITALS — BP 154/70 | HR 103 | Temp 98.2°F | Wt 194.0 lb

## 2023-08-25 DIAGNOSIS — J209 Acute bronchitis, unspecified: Secondary | ICD-10-CM

## 2023-08-25 MED ORDER — HYDROCODONE BIT-HOMATROP MBR 5-1.5 MG/5ML PO SOLN: 5.0000 mL | Freq: Four times a day (QID) | ORAL | 0 refills | Status: DC | PRN

## 2023-08-25 NOTE — Telephone Encounter (Signed)
 noted

## 2023-08-25 NOTE — Progress Notes (Signed)
 Established Patient Office Visit  Subjective   Patient ID: Vanessa Price, female    DOB: 03-Nov-1955  Age: 68 y.o. MRN: [id number]  Chief Complaint  Patient presents with   Cough   Wheezing   Abdominal Pain    HPI   Patient is seen with onset about a week ago of some cough possibly with some intermittent wheezing.  She has had a bit of abdominal soreness which she attributes to the coughing.  Denies any fever.  No history of asthma.  No recent sore throat symptoms.  No chronic or acute sinusitis symptoms.  Has taken Tessalon  pearls in the past without much success for her cough.  Denies any hemoptysis.  No dyspnea.  Cough particularly bothersome at night  Past Medical History:  Diagnosis Date   ANXIETY 06/04/2009   GERD 08/14/2008   occasional   Hx of adenomatous polyp of colon 03/11/2018   HYPERLIPIDEMIA 09/14/2006   HYPERTENSION 09/14/2006   Impaired glucose tolerance    MYOCARDIAL PERFUSION SCAN, WITH STRESS TEST, ABNORMAL 10/19/2008   PREMATURE VENTRICULAR CONTRACTIONS 08/14/2008   Past Surgical History:  Procedure Laterality Date   ABDOMINAL HYSTERECTOMY  1999   menorrhagia/partial   CESAREAN SECTION     2 times    reports that she quit smoking about 30 years ago. Her smoking use included cigarettes. She has never used smokeless tobacco. She reports that she does not currently use alcohol. She reports that she does not use drugs. family history includes Arthritis in her mother; Cancer in her brother; Diabetes in her mother; Heart disease in her mother; Mental illness in her father. Allergies  Allergen Reactions   Lisinopril     REACTION: cough   Risperidone     REACTION: unsure of dose; pt describes reaction of bad cough    Review of Systems  Constitutional:  Negative for chills and fever.  HENT:  Negative for sore throat.   Respiratory:  Positive for cough.       Objective:     BP (!) 154/70 (BP Location: Left Arm, Patient Position: Sitting, Cuff  Size: Large)   Pulse (!) 103   Temp 98.2 F (36.8 C) (Oral)   Wt 194 lb (88 kg)   SpO2 95%   BMI 33.30 kg/m  BP Readings from Last 3 Encounters:  08/25/23 (!) 154/70  04/20/23 130/80  03/12/23 136/74   Wt Readings from Last 3 Encounters:  08/25/23 194 lb (88 kg)  04/20/23 190 lb 9.6 oz (86.5 kg)  03/12/23 191 lb (86.6 kg)      Physical Exam Vitals reviewed.  Constitutional:      General: She is not in acute distress.    Appearance: She is not ill-appearing.  HENT:     Mouth/Throat:     Mouth: Mucous membranes are moist.     Pharynx: Oropharynx is clear.  Cardiovascular:     Rate and Rhythm: Normal rate and regular rhythm.  Pulmonary:     Effort: Pulmonary effort is normal.     Breath sounds: Normal breath sounds. No rales.  Neurological:     Mental Status: She is alert.      No results found for any visits on 08/25/23.    The 10-year ASCVD risk score (Arnett DK, et al., 2019) is: 14.8%    Assessment & Plan:   Cough probably secondary to acute viral bronchitis. - Hycodan cough syrup 1 teaspoon every 6 hours as needed for severe cough.  She is cautioned about  possible sedation with this. - Follow-up for any fever, dyspnea, or persistent cough  Glean Lamy, MD

## 2023-08-25 NOTE — Telephone Encounter (Signed)
 Copied from CRM [id number]. Topic: Clinical - Red Word Triage >> Aug 25, 2023 10:05 AM Alethia Huxley E wrote: [facility] that prompted transfer to Nurse Triage: Worsening cough. Patient has had a cough for a week that is uncontrollable. Has had headaches, chills and a sore stomach.   Chief Complaint: cough Symptoms: headaches, chills, abdominal pain when coughing, unproductive cough, coughing fits Frequency: continual Pertinent Negatives: Patient denies chest pain, fever, SOB when not laying down Disposition: [] 911 / [] ED /[] Urgent Care (no appt availability in office) / [x] Appointment(In office/virtual)/ []  [facility]/ [] Home Care/ [] Refused Recommended Disposition /[] [facility]/ []  Follow-up with PCP Additional Notes: Pt reporting that she has been experiencing unproductive coughing fits for a week now, worsening, wheezing at night with laying flat but otherwise denies SOB, also having chills, headaches, and abdominal pain only when coughing from coughing so much. Advised pt be examined in next 4 hours, scheduled with earliest PCP office appt today, advised call back if worsening. Pt verbalized understanding.  Reason for Disposition  Wheezing is present  Answer Assessment - Initial Assessment Questions 1. ONSET: "When did the cough begin?"      A week ago 2. SEVERITY: "How bad is the cough today?"      Coughing fits, abdominal pain from coughing so much 3. SPUTUM: "Describe the color of your sputum" (none, dry cough; clear, white, yellow, green)     Not coughing anything up 5. DIFFICULTY BREATHING: "Are you having difficulty breathing?" If Yes, ask: "How bad is it?" (e.g., mild, moderate, severe)    - MILD: No SOB at rest, mild SOB with walking, speaks normally in sentences, can lie down, no retractions, pulse < 100.    - MODERATE: SOB at rest, SOB with minimal exertion and prefers to sit, cannot lie down flat, speaks in phrases, mild retractions, audible wheezing, pulse  100-120.    - SEVERE: Very SOB at rest, speaks in single words, struggling to breathe, sitting hunched forward, retractions, pulse > 120      Denies, wheezing at night when laying flat, Fine with breathing as long as moving around, Denies SOB otherwise 6. FEVER: "Do you have a fever?" If Yes, ask: "What is your temperature, how was it measured, and when did it start?"     no 7. CARDIAC HISTORY: "Do you have any history of heart disease?" (e.g., heart attack, congestive heart failure)      HTN, PVCs 8. LUNG HISTORY: "Do you have any history of lung disease?"  (e.g., pulmonary embolus, asthma, emphysema)     OSA 10. OTHER SYMPTOMS: "Do you have any other symptoms?" (e.g., runny nose, wheezing, chest pain)       Wheezing, chills, abdominal pain when coughing, coughing fits, headaches  Protocols used: Cough - Acute Non-Productive-A-AH

## 2023-09-01 ENCOUNTER — Other Ambulatory Visit: Payer: Self-pay

## 2023-09-01 DIAGNOSIS — I10 Essential (primary) hypertension: Secondary | ICD-10-CM

## 2023-09-01 DIAGNOSIS — E785 Hyperlipidemia, unspecified: Secondary | ICD-10-CM | POA: Diagnosis not present

## 2023-09-01 NOTE — Progress Notes (Signed)
 Lab called again with issues seeing the lab- order number read to be manually looked up.

## 2023-09-02 LAB — NMR, LIPOPROFILE
Cholesterol, Total: 164 mg/dL (ref 100–199)
HDL Particle Number: 34.6 umol/L (ref >=30.5)
HDL-C: 47 mg/dL (ref >39)
LDL Particle Number: 1369 nmol/L — ABNORMAL HIGH (ref <1000)
LDL Size: 20.2 nm — ABNORMAL LOW (ref >20.5)
LDL-C (NIH Calc): 97 mg/dL (ref 0–99)
LP-IR Score: 76 — ABNORMAL HIGH (ref <=45)
Small LDL Particle Number: 838 nmol/L — ABNORMAL HIGH (ref <=527)
Triglycerides: 110 mg/dL (ref 0–149)

## 2023-09-11 ENCOUNTER — Encounter: Payer: Self-pay | Admitting: Internal Medicine

## 2023-09-11 ENCOUNTER — Ambulatory Visit: Payer: Self-pay | Attending: Internal Medicine | Admitting: Internal Medicine

## 2023-09-11 VITALS — BP 132/84 | HR 87 | Ht 64.0 in | Wt 190.2 lb

## 2023-09-11 DIAGNOSIS — M791 Myalgia, unspecified site: Secondary | ICD-10-CM | POA: Diagnosis not present

## 2023-09-11 DIAGNOSIS — T466X5D Adverse effect of antihyperlipidemic and antiarteriosclerotic drugs, subsequent encounter: Secondary | ICD-10-CM | POA: Diagnosis not present

## 2023-09-11 DIAGNOSIS — E7841 Elevated Lipoprotein(a): Secondary | ICD-10-CM | POA: Diagnosis not present

## 2023-09-11 DIAGNOSIS — E785 Hyperlipidemia, unspecified: Secondary | ICD-10-CM

## 2023-09-11 DIAGNOSIS — T466X5A Adverse effect of antihyperlipidemic and antiarteriosclerotic drugs, initial encounter: Secondary | ICD-10-CM

## 2023-09-11 NOTE — Progress Notes (Signed)
 LIPID CLINIC CONSULT NOTE  Chief Complaint:  Follow-up dyslipidemia  Primary Care Physician: Zilphia Hilt, Charyl Coppersmith, MD  Primary Cardiologist:  None  HPI:  Vanessa Price is a 68 y.o. female who is being seen today for the evaluation of dyslipidemia at the request of Zilphia Hilt, Achilles Achilles*.  This is a pleasant 68 year old female kindly referred by Dr. Ival Marines for evaluation and management of dyslipidemia, specifically elevated triglycerides.  Most recently lipid profile showed total cholesterol of 249, triglycerides 293 (as high as 774 in the past), HDL 39, and LDL of 174.  Unfortunately, she has been intolerant to statins, having failed multiple statins in the past over 20+ years of therapy.  She is currently on fenofibrate  which has resulted in marked improvement in her triglycerides however again her LDL cholesterol remains quite high.  There is no known coronary disease.  There is a family history of heart disease.  10/12/2019  Vanessa Price returns today for follow-up.  She had coronary calcium  scoring which was 0 although has other risk factors including hypertension, obesity, prediabetes and at least intermediate cardiovascular risk factors.  I recommended a target LDL less than 100.  She was started on bempedoic acid  (Nexletol ) due to the fact that she did not tolerate multiple statins due to myalgia as well as ezetimibe .  She has had some reduction in her cholesterol however not significant.  Total cholesterol is now 2 and 37, triglycerides 279, HDL 32 and LDL 153 (down from 177).  Since she is not able to reach target, I feel that we will need additional therapy.  She is also on fenofibrate  145 mg daily.  08/01/2020  Vanessa Price is seen today in follow-up.  Her cholesterol has come down substantially with the addition of Repatha .  Direct LDL is now 41, total cholesterol 111, triglycerides remain elevated at 320 and HDL low at 38.  She reports that the  bempedoic acid  is quite expensive.  02/04/2021  Vanessa Price returns today for follow-up.  Her cholesterol has come down nicely on combination of Repatha  and Lovaza .  She is no longer taking fenofibrate .  Total cholesterol 147, HDL 44, triglycerides 162 and LDL 75.  Overall she seems to be tolerating the medicine well.  She was concerned about recently having some hair loss.  This appears to be more in a female pattern hair loss and I told her that its not a side effect of Repatha .  02/07/2022  Vanessa Price is seen today in follow-up.  Overall she is doing well.  She had repeat lipids drawn.  Her total cholesterol is now 152, triglycerides are slightly higher at 181, HDL 40 and LDL 75.  Overall still pretty good control.  She reports being a little less active than previous which may account for this.  We discussed checking LP(a) today which has not yet been assessed.  Fortunately she had no coronary calcium .  03/12/2023  Vanessa Price is seen today in follow-up.  She has continued to do well on Repatha  now for several years.  Her lipid NMR shows an LDL particle #982, LDL 92, HDL 44 and triglycerides 279.  Her triglycerides have been normal previously however she notes that she may be off of her diet somewhat and not exercising as much.  She did have a mildly elevated LP(a) of 87 nmol/L, this was on therapy with Repatha .  She remains asymptomatic from a cardiac standpoint.  09/11/2023  Vanessa Price is seen today in follow-up.  She reports  over the past several weeks she has had upper respiratory infection that she says is difficult for her to get rid of.  Has been persistent cough and feeling of drainage which is worse at night.  She denies any nasal congestion.  She has been using over-the-counter medications.  I encouraged her to follow-up with her PCP for this.  Her lipids are a little bit worse.  Her LDL particle number has gone up with an increase in LDL calculated however  her triglycerides are lower.  LDL particle #1369 with an LDL of 97, HDL 47 and triglycerides 110.  Her small LDL particle number is gone up slightly as well to 838.  She says that she has liberalized her diet a little more.  She also has a history of low vitamin D  and was on therapy but has not been on any maintenance other than multivitamin.  Her last vitamin D  level was low in August 2024.  She needs reassessment of that but likely will need to be on higher dose of maintenance therapy.  PMHx:  Past Medical History:  Diagnosis Date   ANXIETY 06/04/2009   GERD 08/14/2008   occasional   Hx of adenomatous polyp of colon 03/11/2018   HYPERLIPIDEMIA 09/14/2006   HYPERTENSION 09/14/2006   Impaired glucose tolerance    MYOCARDIAL PERFUSION SCAN, WITH STRESS TEST, ABNORMAL 10/19/2008   PREMATURE VENTRICULAR CONTRACTIONS 08/14/2008    Past Surgical History:  Procedure Laterality Date   ABDOMINAL HYSTERECTOMY  1999   menorrhagia/partial   CESAREAN SECTION     2 times    FAMHx:  Family History  Problem Relation Age of Onset   Arthritis Mother    Diabetes Mother    Heart disease Mother    Mental illness Father    Cancer Brother        Lung   Sleep apnea Neg Hx     SOCHx:   reports that she quit smoking about 30 years ago. Her smoking use included cigarettes. She has never used smokeless tobacco. She reports that she does not currently use alcohol. She reports that she does not use drugs.  ALLERGIES:  Allergies  Allergen Reactions   Lisinopril     REACTION: cough   Risperidone     REACTION: unsure of dose; pt describes reaction of bad cough    ROS: Pertinent items noted in HPI and remainder of comprehensive ROS otherwise negative.  HOME MEDS: Current Outpatient Medications on File Prior to Visit  Medication Sig Dispense Refill   amLODipine  (NORVASC ) 5 MG tablet Take 1 tablet (5 mg total) by mouth daily. 90 tablet 1   benzonatate  (TESSALON ) 100 MG capsule Take 1 capsule (100 mg  total) by mouth 2 (two) times daily as needed for cough. 20 capsule 0   BIOTIN PO Take by mouth.     Evolocumab  (REPATHA  SURECLICK) 140 MG/ML SOAJ INJECT 1 DOSE SUBCUTANEOUSLY EVERY 14 DAYS 6 mL 1   HYDROcodone  bit-homatropine (HYCODAN) 5-1.5 MG/5ML syrup Take 5 mLs by mouth every 6 (six) hours as needed. 120 mL 0   losartan -hydrochlorothiazide  (HYZAAR) 100-25 MG tablet Take 1 tablet by mouth daily. 100 tablet 1   Multiple Vitamin (MULTIVITAMIN) tablet Take 1 tablet by mouth daily. Rituals     naproxen sodium (ALEVE) 220 MG tablet Take 220 mg by mouth as needed.      No current facility-administered medications on file prior to visit.    LABS/IMAGING: No results found for this or any previous visit (from  the past 48 hours). No results found.  LIPID PANEL:    Component Value Date/Time   CHOL 147 11/17/2022 0743   CHOL 147 01/28/2021 0947   TRIG 135.0 11/17/2022 0743   TRIG 142 03/03/2006 0906   HDL 30.50 (L) 11/17/2022 0743   HDL 44 01/28/2021 0947   CHOLHDL 5 11/17/2022 0743   VLDL 27.0 11/17/2022 0743   LDLCALC 90 11/17/2022 0743   LDLCALC 75 01/28/2021 0947   LDLDIRECT 41.0 05/31/2020 0854    WEIGHTS: Wt Readings from Last 3 Encounters:  09/11/23 190 lb 3.2 oz (86.3 kg)  08/25/23 194 lb (88 kg)  04/20/23 190 lb 9.6 oz (86.5 kg)    VITALS: BP 132/84 (BP Location: Left Arm, Patient Position: Sitting)   Pulse 87   Ht 5' 4 (1.626 m)   Wt 190 lb 3.2 oz (86.3 kg)   SpO2 99%   BMI 32.65 kg/m   EXAM: Deferred  EKG: Deferred  ASSESSMENT: Mixed dyslipidemia, possible familial combined hyperlipidemia [facility]) Intermediate 10 year risk (goal LDL <100) Hypertension Statin and zetia  intolerance-myalgias Zero CAC score (03/2019) Hepatic steatosis Elevated LP(a)-87 nmol/L  PLAN: 1.   Vanessa Price has had an increase in her cholesterol which I think may be more dietary.  She continues on the Repatha .  She had no coronary calcium  in 2021 which is good news.  Her  vitamin D  levels were a little low and she may need to add another 1000 units daily to her regimen.  I have encouraged her to have that reassessed with her labs by her PCP.  Will continue to prescribe the Repatha  and plan to follow-up with our lipid APP in a year or sooner as necessary.  Hazle Lites, MD, [facility], FNLA, FACP  [place]  [facility]  Medical Director of the Advanced Lipid Disorders &  Cardiovascular Risk Reduction Clinic Diplomate of the American Board of Clinical Lipidology Attending Cardiologist  Direct Dial: [phone]  Fax: [phone]  Website:  [email or web address] Vaishali Baise 09/11/2023, 8:04 AM

## 2023-09-11 NOTE — Patient Instructions (Signed)
 Medication Instructions:  Your physician recommends that you continue on your current medications as directed. Please refer to the Current Medication list given to you today.  *If you need a refill on your cardiac medications before your next appointment, please call your pharmacy*  Follow-Up: At [facility], you and your health needs are our priority.  As part of our continuing mission to provide you with exceptional heart care, our providers are all part of one team.  This team includes your primary Cardiologist (physician) and Advanced Practice Providers or APPs (Physician Assistants and Nurse Practitioners) who all work together to provide you with the care you need, when you need it.  Your next appointment:   In 1 year with Slater Duncan in Lipid Clinic

## 2023-09-16 ENCOUNTER — Ambulatory Visit (INDEPENDENT_AMBULATORY_CARE_PROVIDER_SITE_OTHER): Admitting: Internal Medicine

## 2023-09-16 ENCOUNTER — Encounter: Payer: Self-pay | Admitting: Internal Medicine

## 2023-09-16 VITALS — BP 120/80 | Temp 98.2°F | Wt 187.8 lb

## 2023-09-16 DIAGNOSIS — J069 Acute upper respiratory infection, unspecified: Secondary | ICD-10-CM | POA: Diagnosis not present

## 2023-09-16 DIAGNOSIS — R7302 Impaired glucose tolerance (oral): Secondary | ICD-10-CM | POA: Diagnosis not present

## 2023-09-16 LAB — POCT GLYCOSYLATED HEMOGLOBIN (HGB A1C): Hemoglobin A1C: 6.1 % — AB (ref 4.0–5.6)

## 2023-09-16 NOTE — Progress Notes (Signed)
 Established Patient Office Visit     CC/Reason for Visit: Cough  HPI: Vanessa Price is a 68 y.o. female who is coming in today for the above mentioned reasons.  Has been experiencing a cough with postnasal drainage since May.  Was seen once before and given cough syrup without much relief.   Past Medical/Surgical History: Past Medical History:  Diagnosis Date   ANXIETY 06/04/2009   GERD 08/14/2008   occasional   Hx of adenomatous polyp of colon 03/11/2018   HYPERLIPIDEMIA 09/14/2006   HYPERTENSION 09/14/2006   Impaired glucose tolerance    MYOCARDIAL PERFUSION SCAN, WITH STRESS TEST, ABNORMAL 10/19/2008   PREMATURE VENTRICULAR CONTRACTIONS 08/14/2008    Past Surgical History:  Procedure Laterality Date   ABDOMINAL HYSTERECTOMY  1999   menorrhagia/partial   CESAREAN SECTION     2 times    Social History:  reports that she quit smoking about 30 years ago. Her smoking use included cigarettes. She has never used smokeless tobacco. She reports that she does not currently use alcohol. She reports that she does not use drugs.  Allergies: Allergies  Allergen Reactions   Lisinopril     REACTION: cough   Risperidone     REACTION: unsure of dose; pt describes reaction of bad cough    Family History:  Family History  Problem Relation Age of Onset   Arthritis Mother    Diabetes Mother    Heart disease Mother    Mental illness Father    Cancer Brother        Lung   Sleep apnea Neg Hx      Current Outpatient Medications:    amLODipine  (NORVASC ) 5 MG tablet, Take 1 tablet (5 mg total) by mouth daily., Disp: 90 tablet, Rfl: 1   Evolocumab  (REPATHA  SURECLICK) 140 MG/ML SOAJ, INJECT 1 DOSE SUBCUTANEOUSLY EVERY 14 DAYS, Disp: 6 mL, Rfl: 1   losartan -hydrochlorothiazide  (HYZAAR) 100-25 MG tablet, Take 1 tablet by mouth daily., Disp: 100 tablet, Rfl: 1   Multiple Vitamin (MULTIVITAMIN) tablet, Take 1 tablet by mouth daily. Rituals, Disp: , Rfl:    benzonatate   (TESSALON ) 100 MG capsule, Take 1 capsule (100 mg total) by mouth 2 (two) times daily as needed for cough., Disp: 20 capsule, Rfl: 0   BIOTIN PO, Take by mouth., Disp: , Rfl:    HYDROcodone  bit-homatropine (HYCODAN) 5-1.5 MG/5ML syrup, Take 5 mLs by mouth every 6 (six) hours as needed., Disp: 120 mL, Rfl: 0   naproxen sodium (ALEVE) 220 MG tablet, Take 220 mg by mouth as needed. , Disp: , Rfl:   Review of Systems:  Negative unless indicated in HPI.   Physical Exam: Vitals:   09/16/23 1426  BP: 120/80  Temp: 98.2 F (36.8 C)  TempSrc: Oral  Weight: 187 lb 12.8 oz (85.2 kg)    Body mass index is 32.24 kg/m.   Physical Exam Vitals reviewed.  Constitutional:      Appearance: Normal appearance.  HENT:     Right Ear: Tympanic membrane, ear canal and external ear normal.     Left Ear: Tympanic membrane, ear canal and external ear normal.     Mouth/Throat:     Mouth: Mucous membranes are moist.     Pharynx: Posterior oropharyngeal erythema present.   Eyes:     Conjunctiva/sclera: Conjunctivae normal.     Pupils: Pupils are equal, round, and reactive to light.    Cardiovascular:     Rate and Rhythm: Normal rate and regular  rhythm.  Pulmonary:     Effort: Pulmonary effort is normal.     Breath sounds: Normal breath sounds.   Neurological:     Mental Status: She is alert.      Impression and Plan:  Impaired glucose tolerance -     POCT glycosylated hemoglobin (Hb A1C)  Viral URI with cough   -Given exam findings, PNA, pharyngitis, ear infection are not likely, hence abx have not been prescribed. -Have advised rest, fluids, OTC antihistamines, cough suppressants and mucinex. -RTC if no improvement in 10-14 days.   Time spent:22 minutes reviewing chart, interviewing and examining patient and formulating plan of care.     Tully Theophilus Andrews, MD [facility] at [facility]

## 2023-09-22 ENCOUNTER — Ambulatory Visit: Admitting: Internal Medicine

## 2023-09-30 DIAGNOSIS — Z961 Presence of intraocular lens: Secondary | ICD-10-CM | POA: Diagnosis not present

## 2023-09-30 DIAGNOSIS — H04123 Dry eye syndrome of bilateral lacrimal glands: Secondary | ICD-10-CM | POA: Diagnosis not present

## 2023-11-04 ENCOUNTER — Other Ambulatory Visit: Payer: Self-pay | Admitting: Internal Medicine

## 2023-11-04 DIAGNOSIS — I10 Essential (primary) hypertension: Secondary | ICD-10-CM

## 2023-11-25 DIAGNOSIS — Z1231 Encounter for screening mammogram for malignant neoplasm of breast: Secondary | ICD-10-CM | POA: Diagnosis not present

## 2023-11-25 LAB — HM MAMMOGRAPHY

## 2023-11-27 ENCOUNTER — Other Ambulatory Visit (HOSPITAL_COMMUNITY): Payer: Self-pay

## 2023-12-01 ENCOUNTER — Encounter: Payer: Self-pay | Admitting: Internal Medicine

## 2023-12-08 ENCOUNTER — Telehealth: Payer: Self-pay

## 2023-12-08 DIAGNOSIS — E785 Hyperlipidemia, unspecified: Secondary | ICD-10-CM

## 2023-12-08 DIAGNOSIS — R7302 Impaired glucose tolerance (oral): Secondary | ICD-10-CM

## 2023-12-08 DIAGNOSIS — E559 Vitamin D deficiency, unspecified: Secondary | ICD-10-CM

## 2023-12-08 DIAGNOSIS — E66811 Obesity, class 1: Secondary | ICD-10-CM

## 2023-12-08 DIAGNOSIS — I10 Essential (primary) hypertension: Secondary | ICD-10-CM

## 2023-12-08 DIAGNOSIS — D509 Iron deficiency anemia, unspecified: Secondary | ICD-10-CM

## 2023-12-08 NOTE — Telephone Encounter (Signed)
 Copied from CRM [id number]. Topic: Clinical - Request for Lab/Test Order >> Dec 08, 2023 10:04 AM Adelita E wrote: Reason for CRM: Patient would like lab orders entered and completed before she has a physical on 10/9, as she wants to discuss results at time of physical.

## 2023-12-08 NOTE — Addendum Note (Signed)
 Addended by: KATHRYNE MILLMAN B on: 12/08/2023 05:04 PM   Modules accepted: Orders

## 2023-12-08 NOTE — Telephone Encounter (Signed)
 Labs ordered and appointment scheduled.

## 2023-12-15 ENCOUNTER — Encounter: Payer: Self-pay | Admitting: Family Medicine

## 2023-12-15 ENCOUNTER — Ambulatory Visit (INDEPENDENT_AMBULATORY_CARE_PROVIDER_SITE_OTHER): Admitting: Family Medicine

## 2023-12-15 DIAGNOSIS — Z0000 Encounter for general adult medical examination without abnormal findings: Secondary | ICD-10-CM | POA: Diagnosis not present

## 2023-12-15 NOTE — Progress Notes (Signed)
 PATIENT CHECK-IN and HEALTH RISK ASSESSMENT QUESTIONNAIRE:  -completed by phone for upcoming Medicare Preventive Visit  Pre-Visit Check-in: 1)Vitals (height, wt, BP, etc) - record in vitals section for visit on day of visit Request home vitals (wt, BP, etc.) and enter into vitals, THEN update Vital Signs SmartPhrase below at the top of the HPI. See below.  2)Review and Update Medications, Allergies PMH, Surgeries, Social history in Epic 3)Hospitalizations in the last year with date/reason? No   4)Review and Update Care Team (patient's specialists) in Epic 5) Complete PHQ9 in Epic  6) Complete Fall Screening in Epic 7)Review all Health Maintenance Due and order if not done.  Medicare Wellness Patient Questionnaire:  Answer theses question about your habits: How often do you have a drink containing alcohol?yes, 2 times a month  How many drinks containing alcohol do you have on a typical day when you are drinking?3-4 drinks  How often do you have six or more drinks on one occasion?Never  Have you ever smoked?Yes  Quit date if applicable? 40 year ago   How many packs a day do/did you smoke? Less than a pack a day  Do you use smokeless tobacco?No  Do you use an illicit drugs?No  On average, how many days per week do you engage in moderate to strenuous exercise (like a brisk walk)?Yes, 3 times a week, and 1 class  On average, how many minutes do you engage in exercise at this level? 40- 60 min, walks several days a week, and takes and takes and exercise class Are you sexually active? Yes   Number of partners? One  Typical breakfast: juice, oatmeal, eggs and bacon  Typical lunch: salad  Typical dinner:Meat, vegetable and rice,  Typical snacks: Chips, cracker, dip   Beverages: water, Juice, Soda, coffee   Answer theses question about your everyday activities: Can you perform most household chores?Yes  Are you deaf or have significant trouble hearing?No  Do you feel that you have a problem  with memory?Yes  Do you feel safe at home?Yes  Last dentist visit? 6 month ago  8. Do you have any difficulty performing your everyday activities?No  Are you having any difficulty walking, taking medications on your own, and or difficulty managing daily home needs?No  Do you have difficulty walking or climbing stairs?No  Do you have difficulty dressing or bathing?No  Do you have difficulty doing errands alone such as visiting a doctor's office or shopping?NO  Do you currently have any difficulty preparing food and eating?No  Do you currently have any difficulty using the toilet?No  Do you have any difficulty managing your finances?No  Do you have any difficulties with housekeeping of managing your housekeeping?No    Do you have Advanced Directives in place (Living Will, Healthcare Power or Attorney)? Yes    Last eye Exam and location?Jan 2025, [facility]    Do you currently use prescribed or non-prescribed narcotic or opioid pain medications?No   Do you have a history or close family history of breast, ovarian, tubal or peritoneal cancer or a family member with BRCA (breast cancer susceptibility 1 and 2) gene mutations? Yes Aunt had breast cancer     Nurse/Assistant Credentials/time stamp: Rea RONAL Silvan CMA 3:14 pm     ----------------------------------------------------------------------------------------------------------------------------------------------------------------------------------------------------------------------  Because this visit was a virtual/telehealth visit, some criteria may be missing or patient reported. Any vitals not documented were not able to be obtained and vitals that have been documented are patient reported.    MEDICARE  ANNUAL PREVENTIVE VISIT WITH PROVIDER: (Welcome to [employer], initial annual wellness or annual wellness exam)  Virtual Visit via Video Note  I connected with Vanessa Price on 12/15/23 by a video enabled  telemedicine application and verified that I am speaking with the correct person using two identifiers.  Location patient: home Location provider:work or home office Persons participating in the virtual visit: patient, provider  Concerns and/or follow up today: reports doing ok. Had a little nausea and diarrhea yesterday, better today. No fevers, resp symptoms, bleeding, or other symptoms. Thinks just ate something bad and is better today.   See HM section in Epic for other details of completed HM.    ROS: negative for report of fevers, unintentional weight loss, vision changes, vision loss, hearing loss or change, chest pain, sob, hemoptysis, melena, hematochezia, hematuria, falls, bleeding or bruising, thoughts of suicide or self harm, memory loss  Patient-completed extensive health risk assessment - reviewed and discussed with the patient: See Health Risk Assessment completed with patient prior to the visit either above or in recent phone note. This was reviewed in detailed with the patient today and appropriate recommendations, orders and referrals were placed as needed per Summary below and patient instructions.   Review of Medical History: -PMH, PSH, Family History and current specialty and care providers reviewed and updated and listed below   Patient Care Team: Theophilus Andrews, Tully GRADE, MD as PCP - General (Internal Medicine) Lionell Jon DEL, [facility] (Pharmacist)   Past Medical History:  Diagnosis Date   ANXIETY 06/04/2009   GERD 08/14/2008   occasional   Hx of adenomatous polyp of colon 03/11/2018   HYPERLIPIDEMIA 09/14/2006   HYPERTENSION 09/14/2006   Impaired glucose tolerance    MYOCARDIAL PERFUSION SCAN, WITH STRESS TEST, ABNORMAL 10/19/2008   PREMATURE VENTRICULAR CONTRACTIONS 08/14/2008    Past Surgical History:  Procedure Laterality Date   ABDOMINAL HYSTERECTOMY  1999   menorrhagia/partial   CESAREAN SECTION     2 times    Social History   Socioeconomic  History   Marital status: Widowed    Spouse name: Not on file   Number of children: 2   Years of education: Not on file   Highest education level: Not on file  Occupational History   Not on file  Tobacco Use   Smoking status: Former    Current packs/day: 0.00    Types: Cigarettes    Quit date: 03/24/1993    Years since quitting: 30.7   Smokeless tobacco: Never  Vaping Use   Vaping status: Never Used  Substance and Sexual Activity   Alcohol use: Not Currently   Drug use: No   Sexual activity: Not Currently    Partners: Male    Birth control/protection: Surgical, Abstinence    Comment: HYst-1st intercourse 68 yo-More than 5 partners, hysterectomy  Other Topics Concern   Not on file  Social History Narrative   Live with daughter   Right handed   Caffeine: some days non and sometimes 2 cups of coffee a week   Social Drivers of Corporate investment banker Strain: Low Risk  (09/14/2023)   Overall Financial Resource Strain (CARDIA)    Difficulty of Paying Living Expenses: Not hard at all  Food Insecurity: No Food Insecurity (09/14/2023)   Hunger Vital Sign    Worried About Running Out of Food in the Last Year: Never true    Ran Out of Food in the Last Year: Never true  Transportation Needs: No Transportation Needs (  09/14/2023)   PRAPARE - Administrator, Civil Service (Medical): No    Lack of Transportation (Non-Medical): No  Physical Activity: Sufficiently Active (09/14/2023)   Exercise Vital Sign    Days of Exercise per Week: 5 days    Minutes of Exercise per Session: 30 min  Stress: No Stress Concern Present (09/14/2023)   [employer] of Occupational Health - Occupational Stress Questionnaire    Feeling of Stress: Only a little  Social Connections: Moderately Integrated (09/14/2023)   Social Connection and Isolation Panel    Frequency of Communication with Friends and Family: More than three times a week    Frequency of Social Gatherings with Friends and  Family: More than three times a week    Attends Religious Services: More than 4 times per year    Active Member of [employer] or Organizations: Yes    Attends Banker Meetings: More than 4 times per year    Marital Status: Widowed  Intimate Partner Violence: Not At Risk (11/17/2022)   Humiliation, Afraid, Rape, and Kick questionnaire    Fear of Current or Ex-Partner: No    Emotionally Abused: No    Physically Abused: No    Sexually Abused: No    Family History  Problem Relation Age of Onset   Arthritis Mother    Diabetes Mother    Heart disease Mother    Mental illness Father    Cancer Brother        Lung   Sleep apnea Neg Hx     Current Outpatient Medications on File Prior to Visit  Medication Sig Dispense Refill   amLODipine  (NORVASC ) 5 MG tablet TAKE 1 TABLET EVERY DAY 90 tablet 3   Evolocumab  (REPATHA  SURECLICK) 140 MG/ML SOAJ INJECT 1 DOSE SUBCUTANEOUSLY EVERY 14 DAYS 6 mL 1   HYDROcodone  bit-homatropine (HYCODAN) 5-1.5 MG/5ML syrup Take 5 mLs by mouth every 6 (six) hours as needed. 120 mL 0   losartan -hydrochlorothiazide  (HYZAAR) 100-25 MG tablet Take 1 tablet by mouth daily. 100 tablet 1   Multiple Vitamin (MULTIVITAMIN) tablet Take 1 tablet by mouth daily. Rituals     No current facility-administered medications on file prior to visit.    Allergies  Allergen Reactions   Lisinopril     REACTION: cough   Risperidone     REACTION: unsure of dose; pt describes reaction of bad cough       Physical Exam Vitals requested from patient and listed below if patient had equipment and was able to obtain at home for this virtual visit: There were no vitals filed for this visit. Estimated body mass index is 32.24 kg/m as calculated from the following:   Height as of 09/11/23: 5' 4 (1.626 m).   Weight as of 09/16/23: 187 lb 12.8 oz (85.2 kg).  EKG (optional): deferred due to virtual visit  GENERAL: alert, oriented, no acute distress detected, full vision exam  deferred due to pandemic and/or virtual encounter  HEENT: atraumatic, conjunttiva clear, no obvious abnormalities on inspection of external nose and ears  NECK: normal movements of the head and neck  LUNGS: on inspection no signs of respiratory distress, breathing rate appears normal, no obvious gross SOB, gasping or wheezing  CV: no obvious cyanosis  MS: moves all visible extremities without noticeable abnormality  PSYCH/NEURO: pleasant and cooperative, no obvious depression or anxiety, speech and thought processing grossly intact, Cognitive function grossly intact  Flowsheet Row Clinical Support from 12/15/2023 in [facility]  PHQ-9 Total Score 4        12/15/2023    2:55 PM 04/20/2023    9:18 AM 10/10/2022   10:43 AM 08/13/2022    7:51 AM 11/13/2021   11:06 AM  Depression screen PHQ 2/9  Decreased Interest 0 0 0 0 0  Down, Depressed, Hopeless 1 0 0 1 0  PHQ - 2 Score 1 0 0 1 0  Altered sleeping 2 0   0  Tired, decreased energy 1 0 0 1 1  Change in appetite 0 0 0 0 1  Feeling bad or failure about yourself  0 0 0 0 0  Trouble concentrating 0 0 0 0 0  Moving slowly or fidgety/restless 0 0 0 0 0  Suicidal thoughts 0 0 0 0 0  PHQ-9 Score 4 0   2  Difficult doing work/chores  Not difficult at all  Somewhat difficult Somewhat difficult       10/10/2022   10:44 AM 11/17/2022    7:15 AM 12/30/2022    8:53 AM 04/20/2023    9:18 AM 12/15/2023    2:54 PM  Fall Risk  Falls in the past year? 0 1 0 1 0  Was there an injury with Fall? 0 1 0 0 0  Fall Risk Category Calculator 0 2 0  0  Patient at Risk for Falls Due to No Fall Risks  No Fall Risks  No Fall Risks  Fall risk Follow up  Falls evaluation completed Falls evaluation completed Falls evaluation completed Falls evaluation completed     SUMMARY AND PLAN:  Medicare annual wellness visit, subsequent  Discussed applicable health maintenance/preventive health measures and advised and referred or  ordered per patient preferences: -discussed vaccines due recs/risks, advised can get at the pharmacy. Advise to let us  know when/if doe so that we can update record.  Health Maintenance  Topic Date Due   COVID-19 Vaccine (4 - 2025-26 season) 12/31/2023 (Originally 11/23/2023)   Influenza Vaccine  06/21/2024 (Originally 10/23/2023)   Pneumococcal Vaccine: 50+ Years (1 of 1 - PCV) 12/14/2024 (Originally 11/01/2003)   DTaP/Tdap/Td (2 - Td or Tdap) 08/16/2024   Mammogram  11/24/2024   Medicare Annual Wellness (AWV)  12/14/2024   Colonoscopy  03/03/2025   DEXA SCAN  Completed   Hepatitis C Screening  Completed   Zoster Vaccines- Shingrix   Completed   HPV VACCINES  Aged Out   Meningococcal B Vaccine  Aged Out      Education and counseling on the following was provided based on the above review of health and a plan/checklist for the patient, along with additional information discussed, was provided for the patient in the patient instructions :   -Advised and counseled on a healthy lifestyle - including the importance of a healthy diet, regular physical activity -Reviewed patient's current diet. Advised and counseled on a whole foods based healthy diet. Emphasized eliminating ultraprocessed food and drink, particularly food with added sweetener, ultraprocessed grains and meats. A summary of a healthy diet was provided in the Patient Instructions.  -reviewed patient's current physical activity level and discussed exercise guidelines for adults. Congratulated on healthy habits and encouraged to continue.  -Advise yearly dental visits at minimum and regular eye exams   Follow up: see patient instructions     Patient Instructions  I really enjoyed getting to talk with you today! I am available on Tuesdays and Thursdays for virtual visits if you have any questions or concerns, or if I  can be of any further assistance.   CHECKLIST FROM ANNUAL WELLNESS VISIT:  -Follow up (please call to schedule if  not scheduled after visit):   -yearly for annual wellness visit with primary care office  Here is a list of your preventive care/health maintenance measures and the plan for each if any are due:  PLAN For any measures below that may be due:    1. Can get the vaccines at the pharmacy. Please bring proof of receipt when you do so that we can update your record accurately.   Health Maintenance  Topic Date Due   COVID-19 Vaccine (4 - 2025-26 season) 12/31/2023 (Originally 11/23/2023)   Influenza Vaccine  06/21/2024 (Originally 10/23/2023)   Pneumococcal Vaccine: 50+ Years (1 of 1 - PCV) 12/14/2024 (Originally 11/01/2003)   DTaP/Tdap/Td (2 - Td or Tdap) 08/16/2024   Mammogram  11/24/2024   Medicare Annual Wellness (AWV)  12/14/2024   Colonoscopy  03/03/2025   DEXA SCAN  Completed   Hepatitis C Screening  Completed   Zoster Vaccines- Shingrix   Completed   HPV VACCINES  Aged Out   Meningococcal B Vaccine  Aged Out    -See a dentist at least yearly  -Get your eyes checked and then per your eye specialist's recommendations  -Other issues addressed today:   -I have included below further information regarding a healthy whole foods based diet, physical activity guidelines for adults, stress management and opportunities for social connections. I hope you find this information useful.   -----------------------------------------------------------------------------------------------------------------------------------------------------------------------------------------------------------------------------------------------------------    NUTRITION: -eat real food: lots of colorful vegetables (half the plate) and fruits -5-7 servings of vegetables and fruits per day (fresh or steamed is best), exp. 2 servings of vegetables with lunch and dinner and 2 servings of fruit per day. Berries and greens such as kale and collards are great choices.  -consume on a regular basis:  fresh fruits, fresh  veggies, fish, nuts, seeds, healthy oils (such as olive oil, avocado oil), whole grains (make sure for bread/pasta/crackers/etc., that the first ingredient on label contains the word whole), legumes. -can eat small amounts of dairy and lean meat (no larger than the palm of your hand), but avoid processed meats such as ham, bacon, lunch meat, etc. -drink water -try to avoid fast food and pre-packaged foods, processed meat, ultra processed foods/beverages (donuts, candy, etc.) -most experts advise limiting sodium to < 2300mg  per day, should limit further is any chronic conditions such as high blood pressure, heart disease, diabetes, etc. The American Heart Association advised that < 1500mg  is is ideal -try to avoid foods/beverages that contain any ingredients with names you do not recognize  -try to avoid foods/beverages  with added sugar or sweeteners/sweets  -try to avoid sweet drinks (including diet drinks): soda, juice, Gatorade, sweet tea, power drinks, diet drinks -try to avoid white rice, white bread, pasta (unless whole grain)  EXERCISE GUIDELINES FOR ADULTS: -if you wish to increase your physical activity, do so gradually and with the approval of your doctor -STOP and seek medical care immediately if you have any chest pain, chest discomfort or trouble breathing when starting or increasing exercise  -move and stretch your body, legs, feet and arms when sitting for long periods -Physical activity guidelines for optimal health in adults: -get at least 150 minutes per week of moderate exercise (can talk, but not sing); this is about 20-30 minutes of sustained activity 5-7 days per week or two 10-15 minute episodes of sustained activity 5-7 days per week -  do some muscle building/resistance training/strength training at least 2 days per week  -balance exercises 3+ days per week:   Stand somewhere where you have something sturdy to hold onto if you lose balance    1) lift up on toes, then back  down, start with 5x per day and work up to 20x   2) stand and lift one leg straight out to the side so that foot is a few inches of the floor, start with 5x each side and work up to 20x each side   3) stand on one foot, start with 5 seconds each side and work up to 20 seconds on each side  If you need ideas or help with getting more active:  -Silver sneakers https://tools.silversneakers.com  -Walk with a Doc: http://www.duncan-williams.com/  -try to include resistance (weight lifting/strength building) and balance exercises twice per week: or the following link for ideas: http://castillo-powell.com/  BuyDucts.dk  STRESS MANAGEMENT: -can try meditating, or just sitting quietly with deep breathing while intentionally relaxing all parts of your body for 5 minutes daily -if you need further help with stress, anxiety or depression please follow up with your primary doctor or contact the wonderful folks at [facility]: [phone]  SOCIAL CONNECTIONS: -options in [place] if you wish to engage in more social and exercise related activities:  -Silver sneakers https://tools.silversneakers.com  -Walk with a Doc: http://www.duncan-williams.com/  -Check out the [facility] Active Adults 50+ section on the [place] of [employer] (hiking clubs, book clubs, cards and games, chess, exercise classes, aquatic classes and much more) - see the website for details: https://www.[place].gov/departments/parks-recreation/active-adults50  -YouTube has lots of exercise videos for different ages and abilities as well  -[facility] (a variety of indoor and outdoor inperson activities for adults). (248)334-5432. [address]  -Virtual Online Classes (a variety of topics): see seniorplanet.org or call [phone]  -consider volunteering at a school, hospice center, church, senior  center or elsewhere            Chiquita JONELLE Cramp, DO

## 2023-12-15 NOTE — Patient Instructions (Signed)
 I really enjoyed getting to talk with you today! I am available on Tuesdays and Thursdays for virtual visits if you have any questions or concerns, or if I can be of any further assistance.   CHECKLIST FROM ANNUAL WELLNESS VISIT:  -Follow up (please call to schedule if not scheduled after visit):   -yearly for annual wellness visit with primary care office  Here is a list of your preventive care/health maintenance measures and the plan for each if any are due:  PLAN For any measures below that may be due:    1. Can get the vaccines at the pharmacy. Please bring proof of receipt when you do so that we can update your record accurately.   Health Maintenance  Topic Date Due   COVID-19 Vaccine (4 - 2025-26 season) 12/31/2023 (Originally 11/23/2023)   Influenza Vaccine  06/21/2024 (Originally 10/23/2023)   Pneumococcal Vaccine: 50+ Years (1 of 1 - PCV) 12/14/2024 (Originally 11/01/2003)   DTaP/Tdap/Td (2 - Td or Tdap) 08/16/2024   Mammogram  11/24/2024   Medicare Annual Wellness (AWV)  12/14/2024   Colonoscopy  03/03/2025   DEXA SCAN  Completed   Hepatitis C Screening  Completed   Zoster Vaccines- Shingrix   Completed   HPV VACCINES  Aged Out   Meningococcal B Vaccine  Aged Out    -See a dentist at least yearly  -Get your eyes checked and then per your eye specialist's recommendations  -Other issues addressed today:   -I have included below further information regarding a healthy whole foods based diet, physical activity guidelines for adults, stress management and opportunities for social connections. I hope you find this information useful.   -----------------------------------------------------------------------------------------------------------------------------------------------------------------------------------------------------------------------------------------------------------    NUTRITION: -eat real food: lots of colorful vegetables (half the plate) and fruits -5-7  servings of vegetables and fruits per day (fresh or steamed is best), exp. 2 servings of vegetables with lunch and dinner and 2 servings of fruit per day. Berries and greens such as kale and collards are great choices.  -consume on a regular basis:  fresh fruits, fresh veggies, fish, nuts, seeds, healthy oils (such as olive oil, avocado oil), whole grains (make sure for bread/pasta/crackers/etc., that the first ingredient on label contains the word whole), legumes. -can eat small amounts of dairy and lean meat (no larger than the palm of your hand), but avoid processed meats such as ham, bacon, lunch meat, etc. -drink water -try to avoid fast food and pre-packaged foods, processed meat, ultra processed foods/beverages (donuts, candy, etc.) -most experts advise limiting sodium to < 2300mg  per day, should limit further is any chronic conditions such as high blood pressure, heart disease, diabetes, etc. The American Heart Association advised that < 1500mg  is is ideal -try to avoid foods/beverages that contain any ingredients with names you do not recognize  -try to avoid foods/beverages  with added sugar or sweeteners/sweets  -try to avoid sweet drinks (including diet drinks): soda, juice, Gatorade, sweet tea, power drinks, diet drinks -try to avoid white rice, white bread, pasta (unless whole grain)  EXERCISE GUIDELINES FOR ADULTS: -if you wish to increase your physical activity, do so gradually and with the approval of your doctor -STOP and seek medical care immediately if you have any chest pain, chest discomfort or trouble breathing when starting or increasing exercise  -move and stretch your body, legs, feet and arms when sitting for long periods -Physical activity guidelines for optimal health in adults: -get at least 150 minutes per week of moderate exercise (can  talk, but not sing); this is about 20-30 minutes of sustained activity 5-7 days per week or two 10-15 minute episodes of sustained  activity 5-7 days per week -do some muscle building/resistance training/strength training at least 2 days per week  -balance exercises 3+ days per week:   Stand somewhere where you have something sturdy to hold onto if you lose balance    1) lift up on toes, then back down, start with 5x per day and work up to 20x   2) stand and lift one leg straight out to the side so that foot is a few inches of the floor, start with 5x each side and work up to 20x each side   3) stand on one foot, start with 5 seconds each side and work up to 20 seconds on each side  If you need ideas or help with getting more active:  -Silver sneakers https://tools.silversneakers.com  -Walk with a Doc: http://www.duncan-williams.com/  -try to include resistance (weight lifting/strength building) and balance exercises twice per week: or the following link for ideas: http://castillo-powell.com/  BuyDucts.dk  STRESS MANAGEMENT: -can try meditating, or just sitting quietly with deep breathing while intentionally relaxing all parts of your body for 5 minutes daily -if you need further help with stress, anxiety or depression please follow up with your primary doctor or contact the wonderful folks at [facility]: [phone]  SOCIAL CONNECTIONS: -options in [place] if you wish to engage in more social and exercise related activities:  -Silver sneakers https://tools.silversneakers.com  -Walk with a Doc: http://www.duncan-williams.com/  -Check out the [facility] Active Adults 50+ section on the [place] of [employer] (hiking clubs, book clubs, cards and games, chess, exercise classes, aquatic classes and much more) - see the website for details: https://www.[place].gov/departments/parks-recreation/active-adults50  -YouTube has lots of exercise videos for different ages and abilities as well  -[facility] (a variety of indoor and outdoor inperson activities for adults). (431)308-5932. [address]  -Virtual Online Classes (a variety of topics): see seniorplanet.org or call ([phone]  -consider volunteering at a school, hospice center, church, senior center or elsewhere

## 2023-12-15 NOTE — Progress Notes (Signed)
 Patient was unable to self-report due to a lack of equipment at home via telehealth

## 2023-12-22 ENCOUNTER — Other Ambulatory Visit (INDEPENDENT_AMBULATORY_CARE_PROVIDER_SITE_OTHER)

## 2023-12-22 DIAGNOSIS — E559 Vitamin D deficiency, unspecified: Secondary | ICD-10-CM | POA: Diagnosis not present

## 2023-12-22 DIAGNOSIS — E66811 Obesity, class 1: Secondary | ICD-10-CM | POA: Diagnosis not present

## 2023-12-22 DIAGNOSIS — R7302 Impaired glucose tolerance (oral): Secondary | ICD-10-CM

## 2023-12-22 DIAGNOSIS — I10 Essential (primary) hypertension: Secondary | ICD-10-CM

## 2023-12-22 DIAGNOSIS — D509 Iron deficiency anemia, unspecified: Secondary | ICD-10-CM

## 2023-12-22 DIAGNOSIS — E785 Hyperlipidemia, unspecified: Secondary | ICD-10-CM | POA: Diagnosis not present

## 2023-12-22 LAB — LIPID PANEL
Cholesterol: 165 mg/dL (ref 0–200)
HDL: 43.9 mg/dL (ref >39.00)
LDL Cholesterol: 76 mg/dL (ref 0–99)
NonHDL: 121.45
Total CHOL/HDL Ratio: 4
Triglycerides: 229 mg/dL — ABNORMAL HIGH (ref 0.0–149.0)
VLDL: 45.8 mg/dL — ABNORMAL HIGH (ref 0.0–40.0)

## 2023-12-22 LAB — COMPREHENSIVE METABOLIC PANEL WITH GFR
ALT: 15 U/L (ref 0–35)
AST: 17 U/L (ref 0–37)
Albumin: 4.6 g/dL (ref 3.5–5.2)
Alkaline Phosphatase: 69 U/L (ref 39–117)
BUN: 11 mg/dL (ref 6–23)
CO2: 27 meq/L (ref 19–32)
Calcium: 9.7 mg/dL (ref 8.4–10.5)
Chloride: 105 meq/L (ref 96–112)
Creatinine, Ser: 0.62 mg/dL (ref 0.40–1.20)
GFR: 90.4 mL/min (ref >60.00)
Glucose, Bld: 110 mg/dL — ABNORMAL HIGH (ref 70–99)
Potassium: 3.7 meq/L (ref 3.5–5.1)
Sodium: 141 meq/L (ref 135–145)
Total Bilirubin: 0.3 mg/dL (ref 0.2–1.2)
Total Protein: 7.3 g/dL (ref 6.0–8.3)

## 2023-12-22 LAB — CBC WITH DIFFERENTIAL/PLATELET
Basophils Absolute: 0 K/uL (ref 0.0–0.1)
Basophils Relative: 0.9 % (ref 0.0–3.0)
Eosinophils Absolute: 0.1 K/uL (ref 0.0–0.7)
Eosinophils Relative: 2.6 % (ref 0.0–5.0)
HCT: 42.5 % (ref 36.0–46.0)
Hemoglobin: 14.1 g/dL (ref 12.0–15.0)
Lymphocytes Relative: 46 % (ref 12.0–46.0)
Lymphs Abs: 1.9 K/uL (ref 0.7–4.0)
MCHC: 33.1 g/dL (ref 30.0–36.0)
MCV: 83.9 fl (ref 78.0–100.0)
Monocytes Absolute: 0.2 K/uL (ref 0.1–1.0)
Monocytes Relative: 5.3 % (ref 3.0–12.0)
Neutro Abs: 1.9 K/uL (ref 1.4–7.7)
Neutrophils Relative %: 45.2 % (ref 43.0–77.0)
Platelets: 214 K/uL (ref 150.0–400.0)
RBC: 5.06 Mil/uL (ref 3.87–5.11)
RDW: 15 % (ref 11.5–15.5)
WBC: 4.1 K/uL (ref 4.0–10.5)

## 2023-12-22 LAB — HEMOGLOBIN A1C: Hgb A1c MFr Bld: 6.1 % (ref 4.6–6.5)

## 2023-12-22 LAB — VITAMIN B12: Vitamin B-12: 850 pg/mL (ref 211–911)

## 2023-12-22 LAB — VITAMIN D 25 HYDROXY (VIT D DEFICIENCY, FRACTURES): VITD: 30.42 ng/mL (ref 30.00–100.00)

## 2023-12-22 LAB — TSH: TSH: 2.3 u[IU]/mL (ref 0.35–5.50)

## 2023-12-28 ENCOUNTER — Ambulatory Visit: Payer: Self-pay | Admitting: Internal Medicine

## 2023-12-31 ENCOUNTER — Encounter: Payer: Self-pay | Admitting: Internal Medicine

## 2023-12-31 ENCOUNTER — Ambulatory Visit: Admitting: Internal Medicine

## 2023-12-31 VITALS — BP 158/90 | HR 95 | Temp 98.4°F | Ht 64.5 in | Wt 193.7 lb

## 2023-12-31 DIAGNOSIS — Z780 Asymptomatic menopausal state: Secondary | ICD-10-CM | POA: Diagnosis not present

## 2023-12-31 DIAGNOSIS — E66811 Obesity, class 1: Secondary | ICD-10-CM | POA: Diagnosis not present

## 2023-12-31 DIAGNOSIS — I10 Essential (primary) hypertension: Secondary | ICD-10-CM

## 2023-12-31 DIAGNOSIS — Z0000 Encounter for general adult medical examination without abnormal findings: Secondary | ICD-10-CM

## 2023-12-31 DIAGNOSIS — R7302 Impaired glucose tolerance (oral): Secondary | ICD-10-CM | POA: Diagnosis not present

## 2023-12-31 DIAGNOSIS — E785 Hyperlipidemia, unspecified: Secondary | ICD-10-CM | POA: Diagnosis not present

## 2023-12-31 DIAGNOSIS — E559 Vitamin D deficiency, unspecified: Secondary | ICD-10-CM

## 2023-12-31 MED ORDER — VALSARTAN-HYDROCHLOROTHIAZIDE 160-25 MG PO TABS: 1.0000 | ORAL_TABLET | Freq: Every day | ORAL | 1 refills | Status: AC

## 2023-12-31 NOTE — Progress Notes (Signed)
 Established Patient Office Visit     CC/Reason for Visit: Annual preventive exam and follow-up chronic conditions.  HPI: Vanessa Price is a 68 y.o. female who is coming in today for the above mentioned reasons. Past Medical History is significant for: Hypertension, impaired glucose tolerance, vitamin D  deficiency and iron deficiency anemia.  Feeling well.  She stopped taking losartan  because of abdominal discomfort.  She has routine eye and dental care.  She is due for flu, COVID, pneumonia vaccines.  Cancer screening is up-to-date.  She is overdue for a bone density.   Past Medical/Surgical History: Past Medical History:  Diagnosis Date   ANXIETY 06/04/2009   GERD 08/14/2008   occasional   Hx of adenomatous polyp of colon 03/11/2018   HYPERLIPIDEMIA 09/14/2006   HYPERTENSION 09/14/2006   Impaired glucose tolerance    MYOCARDIAL PERFUSION SCAN, WITH STRESS TEST, ABNORMAL 10/19/2008   PREMATURE VENTRICULAR CONTRACTIONS 08/14/2008    Past Surgical History:  Procedure Laterality Date   ABDOMINAL HYSTERECTOMY  1999   menorrhagia/partial   CESAREAN SECTION     2 times    Social History:  reports that she quit smoking about 30 years ago. Her smoking use included cigarettes. She has never used smokeless tobacco. She reports that she does not currently use alcohol. She reports that she does not use drugs.  Allergies: Allergies  Allergen Reactions   Lisinopril     REACTION: cough   Risperidone     REACTION: unsure of dose; pt describes reaction of bad cough    Family History:  Family History  Problem Relation Age of Onset   Arthritis Mother    Diabetes Mother    Heart disease Mother    Mental illness Father    Cancer Brother        Lung   Sleep apnea Neg Hx      Current Outpatient Medications:    amLODipine  (NORVASC ) 5 MG tablet, TAKE 1 TABLET EVERY DAY, Disp: 90 tablet, Rfl: 3   Evolocumab  (REPATHA  SURECLICK) 140 MG/ML SOAJ, INJECT 1 DOSE  SUBCUTANEOUSLY EVERY 14 DAYS, Disp: 6 mL, Rfl: 1   Multiple Vitamin (MULTIVITAMIN) tablet, Take 1 tablet by mouth daily. Rituals, Disp: , Rfl:   Review of Systems:  Negative unless indicated in HPI.   Physical Exam: Vitals:   12/31/23 0659 12/31/23 0702  BP: (!) 132/90 (!) 158/90  Pulse: 95   Temp: 98.4 F (36.9 C)   TempSrc: Oral   SpO2: 100%   Weight: 193 lb 11.2 oz (87.9 kg)   Height: 5' 4.5 (1.638 m)     Body mass index is 32.74 kg/m.   Physical Exam Vitals reviewed.  Constitutional:      General: She is not in acute distress.    Appearance: Normal appearance. She is not ill-appearing, toxic-appearing or diaphoretic.  HENT:     Head: Normocephalic.     Right Ear: Tympanic membrane, ear canal and external ear normal. There is no impacted cerumen.     Left Ear: Tympanic membrane, ear canal and external ear normal. There is no impacted cerumen.     Nose: Nose normal.     Mouth/Throat:     Mouth: Mucous membranes are moist.     Pharynx: Oropharynx is clear. No oropharyngeal exudate or posterior oropharyngeal erythema.  Eyes:     General: No scleral icterus.       Right eye: No discharge.        Left eye: No discharge.  Conjunctiva/sclera: Conjunctivae normal.     Pupils: Pupils are equal, round, and reactive to light.  Neck:     Vascular: No carotid bruit.  Cardiovascular:     Rate and Rhythm: Normal rate and regular rhythm.     Pulses: Normal pulses.     Heart sounds: Normal heart sounds.  Pulmonary:     Effort: Pulmonary effort is normal. No respiratory distress.     Breath sounds: Normal breath sounds.  Abdominal:     General: Abdomen is flat. Bowel sounds are normal.     Palpations: Abdomen is soft.  Musculoskeletal:        General: Normal range of motion.     Cervical back: Normal range of motion.  Skin:    General: Skin is warm and dry.  Neurological:     General: No focal deficit present.     Mental Status: She is alert and oriented to person,  place, and time. Mental status is at baseline.  Psychiatric:        Mood and Affect: Mood normal.        Behavior: Behavior normal.        Thought Content: Thought content normal.        Judgment: Judgment normal.     Flowsheet Row Clinical Support from 12/15/2023 in [facility] [place]  PHQ-9 Total Score 4     Impression and Plan:  Encounter for preventive health examination  Dyslipidemia  Essential hypertension  Impaired glucose tolerance  Vitamin D  deficiency  Obesity (BMI 30.0-34.9)  Postmenopausal estrogen deficiency -     DG Bone Density; Future   -Recommend routine eye and dental care. -Healthy lifestyle discussed in detail. -Labs to be updated today. -Prostate cancer screening: Not applicable Health Maintenance  Topic Date Due   COVID-19 Vaccine (4 - 2025-26 season) 12/31/2023*   Flu Shot  06/21/2024*   Pneumococcal Vaccine for age over 32 (1 of 1 - PCV) 12/14/2024*   DTaP/Tdap/Td vaccine (2 - Td or Tdap) 08/16/2024   Breast Cancer Screening  11/24/2024   Medicare Annual Wellness Visit  12/14/2024   Colon Cancer Screening  03/03/2025   DEXA scan (bone density measurement)  Completed   Hepatitis C Screening  Completed   Zoster (Shingles) Vaccine  Completed   Meningitis B Vaccine  Aged Out  *Topic was postponed. The date shown is not the original due date.     -Declines immunizations today despite counseling. - DEXA requested. - Blood pressure remains elevated.  Start Diovan HCT, continue ambulatory blood pressure measurements and return in 3 months for follow-up.    Tully Theophilus Andrews, MD [facility]

## 2024-01-06 ENCOUNTER — Encounter: Payer: Self-pay | Admitting: Obstetrics and Gynecology

## 2024-01-06 ENCOUNTER — Ambulatory Visit (INDEPENDENT_AMBULATORY_CARE_PROVIDER_SITE_OTHER): Admitting: Obstetrics and Gynecology

## 2024-01-06 VITALS — BP 148/78 | HR 84 | Ht 64.57 in | Wt 195.4 lb

## 2024-01-06 DIAGNOSIS — N952 Postmenopausal atrophic vaginitis: Secondary | ICD-10-CM

## 2024-01-06 DIAGNOSIS — Z9189 Other specified personal risk factors, not elsewhere classified: Secondary | ICD-10-CM

## 2024-01-06 DIAGNOSIS — E2839 Other primary ovarian failure: Secondary | ICD-10-CM

## 2024-01-06 DIAGNOSIS — N393 Stress incontinence (female) (male): Secondary | ICD-10-CM | POA: Diagnosis not present

## 2024-01-06 DIAGNOSIS — Z01419 Encounter for gynecological examination (general) (routine) without abnormal findings: Secondary | ICD-10-CM

## 2024-01-06 MED ORDER — ESTRADIOL 0.01 % VA CREA: 1.0000 | TOPICAL_CREAM | Freq: Every day | VAGINAL | 12 refills | Status: DC

## 2024-01-06 NOTE — Patient Instructions (Signed)
 UROGYN REFERRAL: Dr. Bernardine Gillis and Dr. Marilynne at [facility] with [facility] office: [address], [address] [id number] [phone]  The referral was placed but it may take a couple of weeks for the visit to be coordinated.  Locations you can schedule your bone scan are:  The [facility] bone scan location scheduling line is [phone]  [facility] is [phone]  [facility] radiology [phone]   [facility] [phone]  [facility]: [phone]    Another option if they are behind is [facility] and their number is [phone].  I can send the referral if you want to go there.    Please let me know if you have any trouble scheduling. This is important for management of osteoporosis or osteopenia.   I can sit down with you after the scan to discuss results and treatment.  Dr. Glennon

## 2024-01-06 NOTE — Progress Notes (Unsigned)
 68 y.o. y.o. female here for annual medicare exam. No LMP recorded. Patient has had a hysterectomy.   [id number] female  Sexually active with longstanding partner Has 2 kids and 9 grandchildren  RP:  Established patient presenting for annual gynecologic exam   HPI:  Status post TAH for menorrhagia by Dr. Kiki. Postmenopausal well on no HRT. Does report SUI that is bothersome. Referral placed to urogyn. To begin vaginal estrogen. She has prediabetes.  No pelvic pain.  Abstinent.  Pap Neg in 2020.  Vitiligo in the vulvar region. Asymptomatic to the patient without symptoms of lichen sclerosis. Breasts normal. Screening mammo scheduled next week.  Urine/BMs normal.  Colono 2019.  BMI 33.63.  Health labs with Fam MD. DXA: 2020 normal. Repeat ordered q 2 years  Body mass index is 32.95 kg/m.  Blood pressure (!) 148/78, pulse 84, height 5' 4.57 (1.64 m), weight 195 lb 6.4 oz (88.6 kg), SpO2 96%.     Component Value Date/Time   DIAGPAP  12/25/2020 1431    - Negative for intraepithelial lesion or malignancy (NILM)   ADEQPAP Satisfactory for evaluation. 12/25/2020 1431    GYN HISTORY:    Component Value Date/Time   DIAGPAP  12/25/2020 1431    - Negative for intraepithelial lesion or malignancy (NILM)   ADEQPAP Satisfactory for evaluation. 12/25/2020 1431    OB History  Gravida Para Term Preterm AB Living  5 2 2  3 2   SAB IAB Ectopic Multiple Live Births          # Outcome Date GA Lbr Len/2nd Weight Sex Type Anes PTL Lv  5 Term           4 Term           3 AB           2 AB           1 AB             Past Medical History:  Diagnosis Date   ANXIETY 06/04/2009   GERD 08/14/2008   occasional   Hx of adenomatous polyp of colon 03/11/2018   HYPERLIPIDEMIA 09/14/2006   HYPERTENSION 09/14/2006   Impaired glucose tolerance    MYOCARDIAL PERFUSION SCAN, WITH STRESS TEST, ABNORMAL 10/19/2008   PREMATURE VENTRICULAR CONTRACTIONS 08/14/2008    Past Surgical History:   Procedure Laterality Date   ABDOMINAL HYSTERECTOMY  1999   menorrhagia/partial   CESAREAN SECTION     2 times    Current Outpatient Medications on File Prior to Visit  Medication Sig Dispense Refill   amLODipine  (NORVASC ) 5 MG tablet TAKE 1 TABLET EVERY DAY 90 tablet 3   Evolocumab  (REPATHA  SURECLICK) 140 MG/ML SOAJ INJECT 1 DOSE SUBCUTANEOUSLY EVERY 14 DAYS 6 mL 1   minoxidil (LONITEN) 2.5 MG tablet Take by mouth.     Multiple Vitamin (MULTIVITAMIN) tablet Take 1 tablet by mouth daily. Rituals     valsartan-hydrochlorothiazide  (DIOVAN-HCT) 160-25 MG tablet Take 1 tablet by mouth daily. 90 tablet 1   No current facility-administered medications on file prior to visit.    Social History   Socioeconomic History   Marital status: Widowed    Spouse name: Not on file   Number of children: 2   Years of education: Not on file   Highest education level: Not on file  Occupational History   Not on file  Tobacco Use   Smoking status: Former    Current packs/day: 0.00    Types:  Cigarettes    Quit date: 03/24/1993    Years since quitting: 30.8   Smokeless tobacco: Never  Vaping Use   Vaping status: Never Used  Substance and Sexual Activity   Alcohol use: Yes    Comment: sometimes   Drug use: No   Sexual activity: Not Currently    Partners: Male    Birth control/protection: Surgical, Abstinence    Comment: HYst-1st intercourse 68 yo-More than 5 partners, hysterectomy  Other Topics Concern   Not on file  Social History Narrative   Live with daughter   Right handed   Caffeine: some days non and sometimes 2 cups of coffee a week   Social Drivers of Corporate investment banker Strain: Low Risk  (09/14/2023)   Overall Financial Resource Strain (CARDIA)    Difficulty of Paying Living Expenses: Not hard at all  Food Insecurity: No Food Insecurity (09/14/2023)   Hunger Vital Sign    Worried About Running Out of Food in the Last Year: Never true    Ran Out of Food in the Last Year:  Never true  Transportation Needs: No Transportation Needs (09/14/2023)   PRAPARE - Administrator, Civil Service (Medical): No    Lack of Transportation (Non-Medical): No  Physical Activity: Sufficiently Active (09/14/2023)   Exercise Vital Sign    Days of Exercise per Week: 5 days    Minutes of Exercise per Session: 30 min  Stress: No Stress Concern Present (09/14/2023)   [employer] of Occupational Health - Occupational Stress Questionnaire    Feeling of Stress: Only a little  Social Connections: Moderately Integrated (09/14/2023)   Social Connection and Isolation Panel    Frequency of Communication with Friends and Family: More than three times a week    Frequency of Social Gatherings with Friends and Family: More than three times a week    Attends Religious Services: More than 4 times per year    Active Member of [employer] or Organizations: Yes    Attends Banker Meetings: More than 4 times per year    Marital Status: Widowed  Intimate Partner Violence: Not At Risk (11/17/2022)   Humiliation, Afraid, Rape, and Kick questionnaire    Fear of Current or Ex-Partner: No    Emotionally Abused: No    Physically Abused: No    Sexually Abused: No    Family History  Problem Relation Age of Onset   Arthritis Mother    Diabetes Mother    Heart disease Mother    Mental illness Father    Cancer Brother        Lung   Sleep apnea Neg Hx      Allergies  Allergen Reactions   Lisinopril     REACTION: cough   Risperidone     REACTION: unsure of dose; pt describes reaction of bad cough      Patient's last menstrual period was No LMP recorded. Patient has had a hysterectomy..            Review of Systems Alls systems reviewed and are negative.     Physical Exam Constitutional:      Appearance: Normal appearance.  Genitourinary:     Vulva normal.     No lesions in the vagina.     Right Labia: No rash, lesions or skin changes.    Left Labia: No  lesions, skin changes or rash.    Vaginal cuff intact.    No vaginal discharge or tenderness.  No vaginal prolapse present.    Moderate vaginal atrophy present.     Right Adnexa: not absent.    Left Adnexa: not absent.    Cervix is not absent.     Uterus is not absent. Breasts:    Right: Normal.     Left: Normal.  HENT:     Head: Normocephalic.  Neck:     Thyroid : No thyroid  mass, thyromegaly or thyroid  tenderness.  Cardiovascular:     Rate and Rhythm: Normal rate and regular rhythm.     Heart sounds: Normal heart sounds, S1 normal and S2 normal.  Pulmonary:     Effort: Pulmonary effort is normal.     Breath sounds: Normal breath sounds and air entry.  Abdominal:     General: Bowel sounds are normal. There is no distension.     Palpations: Abdomen is soft. There is no mass.     Tenderness: There is no abdominal tenderness. There is no guarding or rebound.  Musculoskeletal:     Cervical back: Full passive range of motion without pain, normal range of motion and neck supple. No tenderness.     Right lower leg: No edema.     Left lower leg: No edema.  Neurological:     Mental Status: She is alert.  Skin:    General: Skin is warm.  Psychiatric:        Mood and Affect: Mood normal.        Behavior: Behavior normal.        Thought Content: Thought content normal.  Vitals and nursing note reviewed. Exam conducted with a chaperone present.       A:         Well Woman medicare GYN exam                             P:        Pap smear not indicated Encouraged annual mammogram screening Colon cancer screening up-to-date DXA ordered today Labs and immunizations to do with PMD Discussed breast self exams Encouraged healthy lifestyle practices Encouraged Vit D and Calcium    No follow-ups on file.  Vanessa Price

## 2024-01-07 ENCOUNTER — Telehealth: Payer: Self-pay | Admitting: *Deleted

## 2024-01-07 NOTE — Telephone Encounter (Signed)
 Copied from CRM [id number]. Topic: Clinical - Request for Lab/Test Order >> Jan 07, 2024  1:41 PM Vanessa Price wrote: Reason for CRM: pt called to request If she needs a colorectal screening and if its the same as a colonoscopy.

## 2024-01-11 ENCOUNTER — Other Ambulatory Visit: Payer: Self-pay | Admitting: *Deleted

## 2024-01-11 MED ORDER — ESTRADIOL 0.01 % VA CREA: 1.0000 | TOPICAL_CREAM | Freq: Every day | VAGINAL | 12 refills | Status: DC

## 2024-01-11 NOTE — Telephone Encounter (Signed)
 Patient left message on triage line stating she is unable to fill estradiol, needs additional information from provider.   Call placed to [facility], spoke with [facility]. Was advised estradiol Rx needs clarification, instructions conflicting.   Dr. Glennon -please review estradiol Rx instructions and confirm dosing instructions.   Place 1 applicator vaginally at bedtime  OR  Rub a dime size amount in the vagina nightly for 3 weeks then use 3-4 times a week thereafter in the vagina

## 2024-01-11 NOTE — Addendum Note (Signed)
 Addended by: BRUTUS KATE SAILOR on: 01/11/2024 04:52 PM   Modules accepted: Orders

## 2024-01-11 NOTE — Telephone Encounter (Signed)
 Patient is aware.

## 2024-01-11 NOTE — Telephone Encounter (Signed)
 Same Rx was signed. Please review previous message and confirm which dosing instructions you want to proceed with.   Place 1 applicator vaginally at bedtime   OR   Rub a dime size amount in the vagina nightly for 3 weeks then use 3-4 times a week thereafter in the vagina

## 2024-01-12 MED ORDER — ESTRADIOL 0.01 % VA CREA: TOPICAL_CREAM | VAGINAL | 12 refills | Status: DC

## 2024-01-12 NOTE — Addendum Note (Signed)
 Addended by: BRUTUS KATE SAILOR on: 01/12/2024 08:41 AM   Modules accepted: Orders

## 2024-01-12 NOTE — Telephone Encounter (Signed)
 Call placed to [facility], left detailed message requesting to cancel RX estradiol vaginal cream. Rx resent to [facility].   Patient notified.   Encounter closed.

## 2024-02-22 DIAGNOSIS — L658 Other specified nonscarring hair loss: Secondary | ICD-10-CM | POA: Diagnosis not present

## 2024-02-22 DIAGNOSIS — L6681 Central centrifugal cicatricial alopecia: Secondary | ICD-10-CM | POA: Diagnosis not present

## 2024-02-29 ENCOUNTER — Ambulatory Visit (HOSPITAL_BASED_OUTPATIENT_CLINIC_OR_DEPARTMENT_OTHER)
Admission: RE | Admit: 2024-02-29 | Discharge: 2024-02-29 | Disposition: A | Source: Ambulatory Visit | Attending: Obstetrics and Gynecology | Admitting: Obstetrics and Gynecology

## 2024-02-29 DIAGNOSIS — Z01419 Encounter for gynecological examination (general) (routine) without abnormal findings: Secondary | ICD-10-CM | POA: Diagnosis not present

## 2024-02-29 DIAGNOSIS — E2839 Other primary ovarian failure: Secondary | ICD-10-CM | POA: Diagnosis not present

## 2024-02-29 DIAGNOSIS — Z780 Asymptomatic menopausal state: Secondary | ICD-10-CM | POA: Diagnosis not present

## 2024-03-01 ENCOUNTER — Ambulatory Visit: Payer: Self-pay | Admitting: Obstetrics and Gynecology

## 2024-03-08 ENCOUNTER — Encounter: Payer: Self-pay | Admitting: Obstetrics and Gynecology

## 2024-03-08 NOTE — Telephone Encounter (Signed)
 Spoke with patient. Patient reports urinary urgency started Wednesday last week. Voiding normal amounts, stream lasting longer. Urine is normal color. Light discharge with no color or odor. Denies fever/chills, N/V, flank pain or dysuria. Denies vaginal bleeding.   OV scheduled for 12/17 at 1200 with Dr. Glennon.   Routing to provider for final review. Patient is agreeable to disposition. Will close encounter.

## 2024-03-09 ENCOUNTER — Ambulatory Visit: Payer: Self-pay | Admitting: Obstetrics and Gynecology

## 2024-03-09 ENCOUNTER — Ambulatory Visit: Admitting: Obstetrics and Gynecology

## 2024-03-09 ENCOUNTER — Encounter: Payer: Self-pay | Admitting: Obstetrics and Gynecology

## 2024-03-09 VITALS — BP 140/82 | HR 86 | Ht 64.0 in | Wt 190.0 lb

## 2024-03-09 DIAGNOSIS — R350 Frequency of micturition: Secondary | ICD-10-CM

## 2024-03-09 DIAGNOSIS — N390 Urinary tract infection, site not specified: Secondary | ICD-10-CM | POA: Diagnosis not present

## 2024-03-09 MED ORDER — FLUCONAZOLE 150 MG PO TABS: 150.0000 mg | ORAL_TABLET | Freq: Once | ORAL | 3 refills | Status: AC

## 2024-03-09 MED ORDER — CEFTRIAXONE SODIUM 1 G IJ SOLR
1.0000 g | Freq: Once | INTRAMUSCULAR | Status: AC
  Administered 2024-03-09: 16:00:00 1 g via INTRAMUSCULAR

## 2024-03-09 MED ORDER — ESTRADIOL 0.01 % VA CREA: TOPICAL_CREAM | VAGINAL | 12 refills | Status: AC

## 2024-03-09 NOTE — Addendum Note (Signed)
 Addended by: Avenell Sellers P on: 03/09/2024 03:58 PM   Modules accepted: Orders

## 2024-03-09 NOTE — Progress Notes (Signed)
° °  Acute Office Visit  Subjective:    Patient ID: Vanessa Price, female    DOB: 19-Nov-1955, 68 y.o.   MRN: [id number]   HPI 68 y.o. presents today for office visit (Frequent urination./Hard to stop flow.) .  No LMP recorded. Patient has had a hysterectomy.    Review of Systems     Objective:    OBGyn Exam  BP (!) 140/82 (BP Location: Left Arm, Patient Position: Sitting, Cuff Size: Normal)   Pulse 86   Ht 5' 4 (1.626 m)   Wt 190 lb (86.2 kg)   SpO2 95%   BMI 32.61 kg/m  Wt Readings from Last 3 Encounters:  03/09/24 190 lb (86.2 kg)  01/06/24 195 lb 6.4 oz (88.6 kg)  12/31/23 193 lb 11.2 oz (87.9 kg)       UA: c/w UTI  Assessment & Plan:  Dysuria Urinary frequency  Rocephin  was given today. UC pending Diflucan  sent for prophylaxis for yeast infection RTC with persistent or worsening s/s  Vanessa Price Vanessa Price Vanessa Price

## 2024-03-09 NOTE — Patient Instructions (Signed)
 Your blood pressure was increased today.  Make sure to see your primary as well for this. Dr. Glennon

## 2024-03-11 LAB — URINALYSIS, COMPLETE W/RFL CULTURE
Bilirubin Urine: NEGATIVE
Casts: NONE SEEN /LPF
Crystals: NONE SEEN /HPF
Glucose, UA: NEGATIVE
Ketones, ur: NEGATIVE
Nitrites, Initial: NEGATIVE
Specific Gravity, Urine: 1.02 (ref 1.001–1.035)
Yeast: NONE SEEN /HPF
pH: 7 (ref 5.0–8.0)

## 2024-03-11 LAB — URINE CULTURE
MICRO NUMBER:: 17367708
SPECIMEN QUALITY:: ADEQUATE

## 2024-03-11 LAB — CULTURE INDICATED

## 2024-04-04 ENCOUNTER — Ambulatory Visit: Admitting: Internal Medicine

## 2024-04-04 ENCOUNTER — Encounter: Payer: Self-pay | Admitting: Internal Medicine

## 2024-04-04 VITALS — BP 120/80 | Temp 98.2°F | Wt 190.5 lb

## 2024-04-04 DIAGNOSIS — E785 Hyperlipidemia, unspecified: Secondary | ICD-10-CM | POA: Diagnosis not present

## 2024-04-04 DIAGNOSIS — I10 Essential (primary) hypertension: Secondary | ICD-10-CM | POA: Diagnosis not present

## 2024-04-04 DIAGNOSIS — E66811 Obesity, class 1: Secondary | ICD-10-CM | POA: Diagnosis not present

## 2024-04-04 DIAGNOSIS — E559 Vitamin D deficiency, unspecified: Secondary | ICD-10-CM | POA: Diagnosis not present

## 2024-04-04 DIAGNOSIS — R7302 Impaired glucose tolerance (oral): Secondary | ICD-10-CM | POA: Diagnosis not present

## 2024-04-04 LAB — POCT GLYCOSYLATED HEMOGLOBIN (HGB A1C): Hemoglobin A1C: 6.3 % — AB (ref 4.0–5.6)

## 2024-04-04 NOTE — Assessment & Plan Note (Signed)
 Discussed healthy lifestyle, including increased physical activity and better food choices to promote weight loss.

## 2024-04-04 NOTE — Assessment & Plan Note (Signed)
 Currently on 2000 IU daily.  Recheck levels next visit.

## 2024-04-04 NOTE — Assessment & Plan Note (Signed)
 Close to goal with an LDL of 76 most recently.  Not currently on medication.

## 2024-04-04 NOTE — Assessment & Plan Note (Signed)
 Well-controlled on current.

## 2024-04-04 NOTE — Assessment & Plan Note (Signed)
 A1c remains in the prediabetic range at 6.3.  We have discussed lifestyle changes.

## 2024-04-04 NOTE — Progress Notes (Signed)
 "    Established Patient Office Visit     CC/Reason for Visit: Follow-up chronic medical conditions  HPI: Vanessa Price is a 69 y.o. female who is coming in today for the above mentioned reasons. Past Medical History is significant for: Hypertension, hyperlipidemia, impaired glucose tolerance, obesity, vitamin D  deficiency, iron deficiency anemia.  Feeling well, no concerns or complaints.   Past Medical/Surgical History: Past Medical History:  Diagnosis Date   ANXIETY 06/04/2009   GERD 08/14/2008   occasional   Hx of adenomatous polyp of colon 03/11/2018   HYPERLIPIDEMIA 09/14/2006   HYPERTENSION 09/14/2006   Impaired glucose tolerance    MYOCARDIAL PERFUSION SCAN, WITH STRESS TEST, ABNORMAL 10/19/2008   PREMATURE VENTRICULAR CONTRACTIONS 08/14/2008    Past Surgical History:  Procedure Laterality Date   ABDOMINAL HYSTERECTOMY  1999   menorrhagia/partial   CESAREAN SECTION     2 times    Social History:  reports that she quit smoking about 31 years ago. Her smoking use included cigarettes. She has never used smokeless tobacco. She reports current alcohol use. She reports that she does not use drugs.  Allergies: Allergies[1]  Family History:  Family History  Problem Relation Age of Onset   Arthritis Mother    Diabetes Mother    Heart disease Mother    Mental illness Father    Cancer Brother        Lung   Sleep apnea Neg Hx     Current Medications[2]  Review of Systems:  Negative unless indicated in HPI.   Physical Exam: Vitals:   04/04/24 0739  BP: 120/80  Temp: 98.2 F (36.8 C)  TempSrc: Oral  Weight: 190 lb 8 oz (86.4 kg)    Body mass index is 32.7 kg/m.   Physical Exam Vitals reviewed.  Constitutional:      Appearance: Normal appearance. She is obese.  HENT:     Head: Normocephalic and atraumatic.  Eyes:     Conjunctiva/sclera: Conjunctivae normal.  Cardiovascular:     Rate and Rhythm: Normal rate and regular rhythm.   Pulmonary:     Effort: Pulmonary effort is normal.     Breath sounds: Normal breath sounds.  Skin:    General: Skin is warm and dry.  Neurological:     General: No focal deficit present.     Mental Status: She is alert and oriented to person, place, and time.  Psychiatric:        Mood and Affect: Mood normal.        Behavior: Behavior normal.        Thought Content: Thought content normal.        Judgment: Judgment normal.      Impression and Plan:  Impaired glucose tolerance Assessment & Plan: A1c remains in the prediabetic range at 6.3.  We have discussed lifestyle changes.  Orders: -     POCT glycosylated hemoglobin (Hb A1C)  Dyslipidemia Assessment & Plan: Close to goal with an LDL of 76 most recently.  Not currently on medication.   Essential hypertension Assessment & Plan: Well-controlled on current.   Obesity (BMI 30.0-34.9) Assessment & Plan: -Discussed healthy lifestyle, including increased physical activity and better food choices to promote weight loss.    Vitamin D  deficiency Assessment & Plan: Currently on 2000 IU daily.  Recheck levels next visit.      Time spent:32 minutes reviewing chart, interviewing and examining patient and formulating plan of care.     Tully Theophilus Andrews, MD [facility]     [1]  Allergies Allergen Reactions   Lisinopril     REACTION: cough   Risperidone     REACTION: unsure of dose; pt describes reaction of bad cough  [2]  Current Outpatient Medications:    amLODipine  (NORVASC ) 5 MG tablet, TAKE 1 TABLET EVERY DAY, Disp: 90 tablet, Rfl: 3   estradiol  (ESTRACE ) 0.01 % CREA vaginal cream, Rub a dime size amount in the vagina nightly for 3 weeks then use 3-4 times a week thereafter in the vagina, Disp: 42.5 g, Rfl: 12   Evolocumab  (REPATHA  SURECLICK) 140 MG/ML SOAJ, INJECT 1 DOSE SUBCUTANEOUSLY EVERY 14 DAYS, Disp: 6 mL, Rfl: 1   Multiple Vitamin (MULTIVITAMIN) tablet, Take 1 tablet  by mouth daily. Rituals, Disp: , Rfl:    valsartan -hydrochlorothiazide  (DIOVAN -HCT) 160-25 MG tablet, Take 1 tablet by mouth daily., Disp: 90 tablet, Rfl: 1   minoxidil (LONITEN) 2.5 MG tablet, Take by mouth. (Patient not taking: Reported on 04/04/2024), Disp: , Rfl:   "

## 2024-08-03 ENCOUNTER — Ambulatory Visit: Admitting: Internal Medicine
